# Patient Record
Sex: Female | Born: 1940
Health system: Southern US, Community
[De-identification: ages and names within clinical notes are randomized; demographics above are authoritative.]

## PROBLEM LIST (undated history)

## (undated) DIAGNOSIS — M1990 Unspecified osteoarthritis, unspecified site: Secondary | ICD-10-CM

## (undated) DIAGNOSIS — K5790 Diverticulosis of intestine, part unspecified, without perforation or abscess without bleeding: Secondary | ICD-10-CM

## (undated) DIAGNOSIS — E113299 Type 2 diabetes mellitus with mild nonproliferative diabetic retinopathy without macular edema, unspecified eye: Secondary | ICD-10-CM

## (undated) DIAGNOSIS — I712 Thoracic aortic aneurysm, without rupture, unspecified: Secondary | ICD-10-CM

## (undated) DIAGNOSIS — Z86010 Personal history of colonic polyps: Secondary | ICD-10-CM

## (undated) DIAGNOSIS — I7121 Aneurysm of the ascending aorta, without rupture: Secondary | ICD-10-CM

## (undated) DIAGNOSIS — M654 Radial styloid tenosynovitis [de Quervain]: Secondary | ICD-10-CM

## (undated) DIAGNOSIS — Z973 Presence of spectacles and contact lenses: Secondary | ICD-10-CM

## (undated) DIAGNOSIS — I10 Essential (primary) hypertension: Secondary | ICD-10-CM

## (undated) DIAGNOSIS — M26609 Unspecified temporomandibular joint disorder, unspecified side: Secondary | ICD-10-CM

## (undated) DIAGNOSIS — R011 Cardiac murmur, unspecified: Secondary | ICD-10-CM

## (undated) DIAGNOSIS — Z860101 Personal history of adenomatous and serrated colon polyps: Secondary | ICD-10-CM

## (undated) DIAGNOSIS — Z8669 Personal history of other diseases of the nervous system and sense organs: Secondary | ICD-10-CM

## (undated) DIAGNOSIS — E119 Type 2 diabetes mellitus without complications: Secondary | ICD-10-CM

## (undated) DIAGNOSIS — M5030 Other cervical disc degeneration, unspecified cervical region: Secondary | ICD-10-CM

## (undated) DIAGNOSIS — M47812 Spondylosis without myelopathy or radiculopathy, cervical region: Secondary | ICD-10-CM

## (undated) HISTORY — DX: Unspecified osteoarthritis, unspecified site: M19.90

## (undated) HISTORY — DX: Thoracic aortic aneurysm, without rupture: I71.2

## (undated) HISTORY — PX: KNEE ARTHROSCOPY: SUR90

## (undated) HISTORY — DX: Aneurysm of the ascending aorta, without rupture: I71.21

## (undated) HISTORY — PX: JOINT REPLACEMENT: SHX530

## (undated) HISTORY — DX: Essential (primary) hypertension: I10

---

## 1993-08-20 HISTORY — PX: ABDOMINAL HYSTERECTOMY: SHX81

## 2002-01-05 ENCOUNTER — Emergency Department (HOSPITAL_COMMUNITY): Admission: EM | Admit: 2002-01-05 | Discharge: 2002-01-05 | Payer: Self-pay | Admitting: Emergency Medicine

## 2002-01-05 ENCOUNTER — Encounter: Payer: Self-pay | Admitting: Emergency Medicine

## 2003-02-11 ENCOUNTER — Emergency Department (HOSPITAL_COMMUNITY): Admission: EM | Admit: 2003-02-11 | Discharge: 2003-02-12 | Payer: Self-pay | Admitting: Emergency Medicine

## 2003-03-22 ENCOUNTER — Encounter: Payer: Self-pay | Admitting: Internal Medicine

## 2003-03-22 ENCOUNTER — Encounter: Admission: RE | Admit: 2003-03-22 | Discharge: 2003-03-22 | Payer: Self-pay | Admitting: Internal Medicine

## 2004-04-07 ENCOUNTER — Encounter: Admission: RE | Admit: 2004-04-07 | Discharge: 2004-04-07 | Payer: Self-pay | Admitting: Cardiothoracic Surgery

## 2005-01-09 ENCOUNTER — Other Ambulatory Visit: Admission: RE | Admit: 2005-01-09 | Discharge: 2005-01-09 | Payer: Self-pay | Admitting: Obstetrics and Gynecology

## 2005-01-10 ENCOUNTER — Encounter: Admission: RE | Admit: 2005-01-10 | Discharge: 2005-01-10 | Payer: Self-pay | Admitting: Gastroenterology

## 2005-03-22 ENCOUNTER — Encounter (INDEPENDENT_AMBULATORY_CARE_PROVIDER_SITE_OTHER): Payer: Self-pay | Admitting: Specialist

## 2005-03-22 ENCOUNTER — Inpatient Hospital Stay (HOSPITAL_COMMUNITY): Admission: RE | Admit: 2005-03-22 | Discharge: 2005-03-26 | Payer: Self-pay | Admitting: General Surgery

## 2005-03-22 HISTORY — PX: OTHER SURGICAL HISTORY: SHX169

## 2005-05-04 ENCOUNTER — Encounter: Admission: RE | Admit: 2005-05-04 | Discharge: 2005-05-04 | Payer: Self-pay | Admitting: Cardiothoracic Surgery

## 2006-05-01 ENCOUNTER — Encounter: Admission: RE | Admit: 2006-05-01 | Discharge: 2006-05-01 | Payer: Self-pay | Admitting: Cardiothoracic Surgery

## 2006-09-04 ENCOUNTER — Encounter: Admission: RE | Admit: 2006-09-04 | Discharge: 2006-09-19 | Payer: Self-pay | Admitting: Orthopedic Surgery

## 2007-09-05 ENCOUNTER — Encounter: Admission: RE | Admit: 2007-09-05 | Discharge: 2007-09-05 | Payer: Self-pay | Admitting: Internal Medicine

## 2007-09-15 ENCOUNTER — Other Ambulatory Visit: Admission: RE | Admit: 2007-09-15 | Discharge: 2007-09-15 | Payer: Self-pay | Admitting: Obstetrics and Gynecology

## 2007-09-16 ENCOUNTER — Other Ambulatory Visit: Admission: RE | Admit: 2007-09-16 | Discharge: 2007-09-16 | Payer: Self-pay | Admitting: Diagnostic Radiology

## 2007-09-16 ENCOUNTER — Encounter (INDEPENDENT_AMBULATORY_CARE_PROVIDER_SITE_OTHER): Payer: Self-pay | Admitting: Diagnostic Radiology

## 2007-09-16 ENCOUNTER — Encounter: Admission: RE | Admit: 2007-09-16 | Discharge: 2007-09-16 | Payer: Self-pay | Admitting: Internal Medicine

## 2007-10-29 ENCOUNTER — Encounter: Admission: RE | Admit: 2007-10-29 | Discharge: 2007-10-29 | Payer: Self-pay | Admitting: Cardiothoracic Surgery

## 2007-10-31 ENCOUNTER — Ambulatory Visit: Payer: Self-pay | Admitting: Cardiothoracic Surgery

## 2008-01-07 ENCOUNTER — Encounter: Admission: RE | Admit: 2008-01-07 | Discharge: 2008-01-07 | Payer: Self-pay | Admitting: Gastroenterology

## 2008-08-31 HISTORY — PX: ROTATOR CUFF REPAIR: SHX139

## 2009-05-11 ENCOUNTER — Encounter: Admission: RE | Admit: 2009-05-11 | Discharge: 2009-05-11 | Payer: Self-pay | Admitting: Cardiothoracic Surgery

## 2009-05-13 ENCOUNTER — Ambulatory Visit: Payer: Self-pay | Admitting: Cardiothoracic Surgery

## 2009-07-01 ENCOUNTER — Inpatient Hospital Stay (HOSPITAL_COMMUNITY): Admission: RE | Admit: 2009-07-01 | Discharge: 2009-07-04 | Payer: Self-pay | Admitting: Specialist

## 2009-07-01 HISTORY — PX: TOTAL KNEE ARTHROPLASTY: SHX125

## 2010-09-10 ENCOUNTER — Encounter: Payer: Self-pay | Admitting: Cardiothoracic Surgery

## 2010-09-27 ENCOUNTER — Other Ambulatory Visit (INDEPENDENT_AMBULATORY_CARE_PROVIDER_SITE_OTHER): Payer: Self-pay | Admitting: Internal Medicine

## 2010-09-27 DIAGNOSIS — R112 Nausea with vomiting, unspecified: Secondary | ICD-10-CM

## 2010-09-27 DIAGNOSIS — IMO0001 Reserved for inherently not codable concepts without codable children: Secondary | ICD-10-CM

## 2010-10-03 ENCOUNTER — Other Ambulatory Visit (HOSPITAL_COMMUNITY): Payer: Self-pay

## 2010-11-22 LAB — DIFFERENTIAL
Basophils Absolute: 0 10*3/uL (ref 0.0–0.1)
Basophils Relative: 0 % (ref 0–1)
Eosinophils Absolute: 0.1 10*3/uL (ref 0.0–0.7)
Monocytes Relative: 7 % (ref 3–12)
Neutro Abs: 3.4 10*3/uL (ref 1.7–7.7)
Neutrophils Relative %: 66 % (ref 43–77)

## 2010-11-22 LAB — CROSSMATCH

## 2010-11-22 LAB — ABO/RH: ABO/RH(D): A POS

## 2010-11-22 LAB — BASIC METABOLIC PANEL
BUN: 7 mg/dL (ref 6–23)
Chloride: 97 mEq/L (ref 96–112)
Chloride: 99 mEq/L (ref 96–112)
Creatinine, Ser: 0.81 mg/dL (ref 0.4–1.2)
GFR calc non Af Amer: >60 mL/min (ref >60)
Glucose, Bld: 173 mg/dL — ABNORMAL HIGH (ref 70–99)
Glucose, Bld: 208 mg/dL — ABNORMAL HIGH (ref 70–99)
Potassium: 3.8 mEq/L (ref 3.5–5.1)
Potassium: 4 mEq/L (ref 3.5–5.1)
Sodium: 135 mEq/L (ref 135–145)

## 2010-11-22 LAB — PROTIME-INR
INR: 1.1 (ref 0.00–1.49)
Prothrombin Time: 14.1 seconds (ref 11.6–15.2)

## 2010-11-22 LAB — CBC
HCT: 26.9 % — ABNORMAL LOW (ref 36.0–46.0)
HCT: 27.5 % — ABNORMAL LOW (ref 36.0–46.0)
HCT: 33.9 % — ABNORMAL LOW (ref 36.0–46.0)
Hemoglobin: 11.2 g/dL — ABNORMAL LOW (ref 12.0–15.0)
Hemoglobin: 8.9 g/dL — ABNORMAL LOW (ref 12.0–15.0)
Hemoglobin: 9.3 g/dL — ABNORMAL LOW (ref 12.0–15.0)
MCHC: 33.2 g/dL (ref 30.0–36.0)
MCHC: 33.4 g/dL (ref 30.0–36.0)
MCV: 82.9 fL (ref 78.0–100.0)
Platelets: 177 10*3/uL (ref 150–400)
RBC: 3.3 MIL/uL — ABNORMAL LOW (ref 3.87–5.11)
RBC: 4.05 MIL/uL (ref 3.87–5.11)
RDW: 14.4 % (ref 11.5–15.5)
RDW: 14.6 % (ref 11.5–15.5)
RDW: 15.3 % (ref 11.5–15.5)

## 2010-11-22 LAB — COMPREHENSIVE METABOLIC PANEL
Alkaline Phosphatase: 91 U/L (ref 39–117)
BUN: 12 mg/dL (ref 6–23)
CO2: 29 mEq/L (ref 19–32)
Chloride: 98 mEq/L (ref 96–112)
Creatinine, Ser: 0.82 mg/dL (ref 0.4–1.2)
GFR calc non Af Amer: >60 mL/min (ref >60)
Glucose, Bld: 117 mg/dL — ABNORMAL HIGH (ref 70–99)
Potassium: 3.7 mEq/L (ref 3.5–5.1)
Total Bilirubin: 0.5 mg/dL (ref 0.3–1.2)

## 2010-11-22 LAB — URINALYSIS, ROUTINE W REFLEX MICROSCOPIC
Glucose, UA: NEGATIVE mg/dL
Ketones, ur: NEGATIVE mg/dL
Nitrite: NEGATIVE
Specific Gravity, Urine: 1.021 (ref 1.005–1.030)
pH: 7 (ref 5.0–8.0)

## 2010-11-22 LAB — APTT

## 2011-01-02 NOTE — Assessment & Plan Note (Signed)
OFFICE VISIT   [name], Savannah Lewis  DOB:  28-Jul-1941                                        May 13, 2009  CHART #:  [id number]   CURRENT PROBLEMS:  1. 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  2. Hypertension.  3. Degenerative arthritis with pending total right knee replacement by      Dr. Valma Cava.   CURRENT MEDICATIONS:  Benicar 25 mg daily, amlodipine 10 mg daily,  aspirin 81 mg daily, potassium 10 mEq daily.   PRESENT ILLNESS:  The patient is a very nice 70 year old female who was  incidentally found to have a mild fusiform aneurysm of the ascending  aorta in 2004.  This has been followed with serial scans, most recently  MRI-MRA of the thoracic aorta to minimize cumulative radiation exposure.  The aorta has remained stable and asymptomatic.  Her last scan was in  March 2009.  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  A knee replacement is scheduled by Dr. Thomasena Edis in November.   PHYSICAL EXAMINATION:  Vital Signs:  Today, her blood pressure is  130/78, pulse is 110 and her oxygen saturation is 96%.  Her weight is  143 pounds.  General:  She is alert and pleasant.  Lungs:  Breath sounds  are clear and equal.  Cardiac:  Rhythm is regular but increased.  There  is no S3 gallop or murmur.  Extremities:  Peripheral pulses are intact  in all extremities.  Neurologic:  Nonfocal.   Her MRI-MR angiogram of the thoracic aorta shows a stable mild  dilatation of the ascending aorta, 4.0 mm in diameter.  There is no  evidence of penetrating ulcer or intimal flap.  The distal arch measures  2.6 cm and the abdominal aorta is nonaneurysmal.  The proximal carotid  arteries are tortuous but nonobstructive.  Her lung fields on MRI appear  to  be clear.   IMPRESSION AND PLAN:  Mild aneurysmal fusiform dilatation of the  ascending aorta, stable on serial scanning.  I will plan on rescanning  the patient in 18-24 months.   Kerin Perna, M.D.  Electronically Signed   PV/MEDQ  D:  05/13/2009  T:  05/14/2009  Job:  [id number]

## 2011-01-02 NOTE — Assessment & Plan Note (Signed)
OFFICE VISIT   Savannah Lewis, Savannah Lewis  DOB:  April 27, 1941                                        October 31, 2007  CHART #:  [id number]   CURRENT PROBLEMS:  1. Moderate fusiform aneurysm of the ascending aorta, 4 cm, followed      since 2004.  2. Hypertension.  3. Degenerative arthritis.   HISTORY OF PRESENT ILLNESS:  Ms. Savannah Lewis is a 70 year old female who was  found on x-rays to have an incidental ascending aortic shadow on chest x-  ray after a fall.  A subsequent CT angiogram showed a 4-cm diameter  ascending aorta at the level of the mid ascending aorta.  The arch and  descending thoracic aorta were of normal caliber.  the patient has had  no symptoms related to this finding and continues to be carefully  followed for hypertension by Dr. Valentina Lucks.  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   Her last CT angiogram was performed in September 2007.  She now returns  18 months later.  The MRA of her thoracic aorta shows the ascending  aorta to measure 3.9 cm, stable without change.  There was no mural  thrombus, penetrating ulcer or evidence of dissection.   CURRENT MEDICATIONS:  1. Benicar 25 mg daily.  2. Norvasc 10 mg daily.  3. Potassium 10 mEq daily.  4. Aspirin 81 mg daily.   PHYSICAL EXAMINATION:  VITAL SIGNS:  Blood pressure 138/80, pulse 90,  respirations 18, saturation 98%, weight 146 pounds.  GENERAL:  She looks fantastic.  LUNGS:  Her breath sounds are clear.  CARDIAC:  Peripheral pulses are all intact.  Cardiac rhythm is without  murmur or gallop.  NEUROLOGIC:  Exam is intact.  EXTREMITIES:  There is no peripheral edema.   PLAN:  The patient continues to have a mild-to-moderate ascending aortic  fusiform aneurysm.  I have recommended another MRA in 18 months to 2  years.   Kerin Perna, M.D.  Electronically Signed   PV/MEDQ  D:  10/31/2007  T:  11/01/2007  Job:  [id number]   cc:   Thora Lance, M.D.

## 2011-01-05 NOTE — Op Note (Signed)
NAME:  Savannah Lewis, Savannah Lewis                ACCOUNT NO.:  [id number]   MEDICAL RECORD NO.:  [id number]          PATIENT TYPE:  INP   LOCATION:  0003                         FACILITY:  [facility]   PHYSICIAN:  Angelia Mould. Derrell Lolling, M.D.DATE OF BIRTH:  July 18, 1941   DATE OF PROCEDURE:  03/22/2005  DATE OF DISCHARGE:                                 OPERATIVE REPORT   PREOPERATIVE DIAGNOSIS:  Polyp of the cecum.   POSTOPERATIVE DIAGNOSIS:  Polyp of the cecum.   OPERATION PERFORMED:  Laparoscopic-assisted right colectomy.   SURGEON:  Angelia Mould. Derrell Lolling, M.D.   FIRST ASSISTANT:  Cyndia Bent, M.D.   OPERATIVE INDICATIONS:  This is a 70 year old black female who had a single  bout of painless hematochezia earlier this year.  She had a colonoscopy  which showed an erythematous patch at 25 cm but no active bleeding.  She had  of colonic formation preventing Dr. Laural Benes from the getting the scope more  proximally.  She had a virtual colonoscopy which showed a high probability  of a 12 mm polyp in the cecum.  This was read as being 2 cm proximal to the  ileocecal valve.  She was advised to undergo resection of this area to rule  out neoplasia. She has undergone a bowel prep at home and is brought to  hospital electively.   OPERATIVE FINDINGS:  In the cecal bulb below the ileocecal valve, there was  about a 1.5 cm sessile polyp that was multilobulated but felt soft and did  not have any gross findings of carcinoma.  O. Otherwise the exam of the  abdomen and the opened colon was unremarkable.  The appendix was surgically  absent. The liver, gallbladder, stomach, duodenum, small intestine and the  rest of the large intestine looked normal.  The peritoneal surfaces looked  normal.   OPERATIVE TECHNIQUE:  Following the induction of general endotracheal  anesthesia, the patient was identified.  A Foley catheter was inserted.  Intravenous antibiotics were given.  The abdomen was  prepped and draped in  sterile fashion.  0.5% Marcaine with epinephrine local infiltration  anesthetic. A transverse incision was made at the upper rim of the  umbilicus.  The fascia was incised transversely, and the abdominal cavity  entered under direct vision. A 10 mm Hassan trocar was inserted and secured  the pursestring suture with 0 Vicryl. Pneumoperitoneum was created.  The  camera was inserted with visualization and findings as described above.  A 5  mm trocars was placed in this left suprapubic area and the 11 mm trocar was  placed in the left upper quadrant.   The patient was positioned in a Trendelenburg position and rotated to the  left.  We could identify the terminal ileum, the ileocecal valve, the cecum  and right colon.  We mobilized terminal ileum and right colon and hepatic  flexure by dividing the lateral peritoneal attachments using the harmonic  scalpel.  We were able to fully mobilize the colon over  until we could  place the cecum all the way over the body and fundus of the stomach.  We  identified the duodenum and preserved that.  Once we felt we had good  mobility we  released the pneumoperitoneum.  We removed the [facility] trocar at  the umbilicus and extended the transverse incision for a distance of about 8  cm, divided the rectus muscle with cautery and entered the abdominal cavity.  We placed a protractor in place and delivered the terminal ileum and right  colon through the wound.  We were not really sure we could palpate anything.  We transected the terminal ileum about 2 inches proximal ileocecal valve  with a GIA stapling device. We transected the transverse colon to the right  of the middle colic vessels with a GIA stapling device.  Mesenteric vessels  were isolated, clamped, divided and ligated 2-0 silk ties. The ileocolic  vessels were large and they were doubly ligated with 2-0 silk ties.   We took the specimen to a side table and opened it up completely, opening up  the  terminal ileum, ileocecal valve and the antimesenteric wall of the colon  and we found a small polyp in the cecal bulb just below the ileocecal valve  consistent with the CT findings. We sent this for routine examination.   Anastomosis was created to the terminal ileum and the right transverse colon  with a GIA stapling device and the defect left of the bowel wall was closed  with a TA-60 stapling device.  We changed our instruments and gloves at this  point.  A few extra sutures of 3-0 silk were placed to reinforce the staple  line at strategic points.  The mesentery was closed with interrupted figure-  of-eight sutures of 2-0 silk.  We checked for bleeding.  There was none. We  irrigated the specimen.  Everything looked fine and we dropped the intestine  back into the abdominal cavity.  The linea alba was closed with four or five  interrupted sutures of 0 Novofil.  The posterior rectus sheath was closed  with running suture of #1 PDS.  The wound was irrigated with saline and the  anterior rectus sheath closed with a running suture of #1 PDS.  Skin was  closed with skin staples.   We then we created the pneumoperitoneum once again and inserted the video  camera and using bowel graspers, we looked around irrigated a little bit in  the pelvis, right gutter and above the liver and got all of the fluid out.  The bowel anastomosis looked fine.  There was no bleeding, and we felt  everything was going to be fine. The pneumoperitoneum was released.  The  trocars were removed.  The two remaining trocar sites were closed with skin  staples.  Clean bandages were placed, and the patient taken to the recovery  room in d with skin staples. Clean bandages were placed. The patient taken  to recovery room in stable condition.  Estimated blood loss was about 50-70  cc. Complications none.  Sponge, needle and instrument counts were correct.       HMI/MEDQ  D:  03/22/2005  T:  03/22/2005  Job:   [id number]   cc:   [place] Daphine Deutscher, MD  [address]  [place]  [place] [address]   Thora Lance, M.D.  [address]  [place]  [place] [address]  Fax: [phone]

## 2011-01-05 NOTE — Discharge Summary (Signed)
NAME:  Savannah Lewis, Savannah Lewis                ACCOUNT NO.:  [id number]   MEDICAL RECORD NO.:  [id number]          PATIENT TYPE:  INP   LOCATION:  1621                         [facility]ACILITY[facility]   PHYSICIAN:  Angelia Mould. Derrell Lolling, M.D.DATE OF BIRTH:  02/25/1941   DATE OF ADMISSION:  03/22/2005  DATE OF DISCHARGE:  03/26/2005                                 DISCHARGE SUMMARY   FINAL DIAGNOSES:  1.  Villous adenoma of the cecum.  2.  Hypertension.  3.  Thoracic aortic aneurysm  4.  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  5.  Status post ovarian cystectomy and appendectomy.   PROCEDURE:  Laparoscopic-assisted right colectomy on March 22, 2005.   HISTORY OF PRESENT ILLNESS:  This is a 70 year old, African-American female  who had a single episode of painless hematochezia in April of this year.  This was a single episode and has not recurred.  A colonoscopy was performed  on December 15, 2004, and she had a colonic loop formation and Dr. Laural Benes  could not get the scope past 60 cm.  A 25 cm he saw and intensely red  diverticulum and theorized that this might have been the site of her recent  hematochezia.  She then had a virtual colonoscopy on Jan 10, 2005, showing a  12 mm polyp in the cecum fell to be a high probability finding.  She then  had an abdominal CT scan which showed no abnormalities, although she is  known to have a small ascending thoracic aortic aneurysm for which she is  asymptomatic.  Dr. Laural Benes asked me to consider resection of this area of  the colon since he could not get to it by colonoscopy.  I saw the patient in  the office and advised her to have this done because of the potential risks  that this could be malignant or premalignant and she elected to have that  done.   PHYSICAL EXAMINATION:  GENERAL:  Slightly overweight, black female in no  distress.  Height 5 feet 2 inches, weight 156, blood pressure 180/79, pulse  57.  ABDOMEN:  Significant physical  findings limited to the abdomen which is  borderline obese, soft, nontender.  Liver and spleen not enlarged or  hernias.  Well-healed Pfannenstiel incision.  No hernias noted.   HOSPITAL COURSE:  Following a 2-day bowel prep at home, the patient was  admitted electively.  She was taken to operating room on March 22, 2005, and  underwent a laparoscopic-assisted right colectomy.  There were no other  abnormal findings.  We did find a small polyp in the cecum. Final pathology  report showed that this was a benign villous adenoma.  She had 10/10 lymph  nodes examined and were negative for any type of a pathologic process.   Postoperatively, the patient did well.  She had her Foley catheter out on  the first postop day and we resumed her oral Benicar and hydrochlorothiazide  for her hypertension and her blood pressure remained stable and  normotensive.  Once the pathology report was completed, she was advised  about  this.  She was discharged on March 26, 2005, and at that time she was  tolerating a full liquid diet and was having no nausea, vomiting or  bloating.  She had several loose stools.  Her abdomen was soft and benign  with reasonable healing of the wounds.  She was given a prescription for  Vicodin for pain and to resume her home medications.   DISCHARGE MEDICATIONS:  1.  Benicar 25 mg daily.  2.  Atenolol 50 mg daily.  3.  Potassium 10 mEq daily.  4.  Aleve p.r.n.   FOLLOW UP:  She was advised to follow up with me in the office within 1 week  for staple removal.   DIET:  Diet was discussed.   ACTIVITY:  Activities were discussed.      Angelia Mould. Derrell Lolling, M.D.  Electronically Signed     HMI/MEDQ  D:  04/08/2005  T:  04/09/2005  Job:  [id number]   cc:   Danise Edge, M.D.  [address]  [place]  [place] [address]  Fax: [phone]   Thora Lance, M.D.  [address]  [place]  [place] [address]  Fax: [phone]

## 2011-02-08 ENCOUNTER — Other Ambulatory Visit: Payer: Self-pay | Admitting: Cardiothoracic Surgery

## 2011-02-08 DIAGNOSIS — I712 Thoracic aortic aneurysm, without rupture, unspecified: Secondary | ICD-10-CM

## 2011-02-19 ENCOUNTER — Ambulatory Visit: Payer: Self-pay | Admitting: Cardiothoracic Surgery

## 2011-02-19 ENCOUNTER — Other Ambulatory Visit: Payer: Self-pay

## 2011-02-23 ENCOUNTER — Ambulatory Visit (INDEPENDENT_AMBULATORY_CARE_PROVIDER_SITE_OTHER): Payer: Medicare Other | Admitting: Cardiothoracic Surgery

## 2011-02-23 ENCOUNTER — Ambulatory Visit
Admission: RE | Admit: 2011-02-23 | Discharge: 2011-02-23 | Disposition: A | Discharge status: Home / Self Care | Payer: Medicare Other | Source: Ambulatory Visit | Attending: Cardiothoracic Surgery | Admitting: Cardiothoracic Surgery

## 2011-02-23 DIAGNOSIS — I712 Thoracic aortic aneurysm, without rupture, unspecified: Secondary | ICD-10-CM

## 2011-02-23 MED ORDER — GADOBENATE DIMEGLUMINE 529 MG/ML IV SOLN: 13.0000 mL | Freq: Once | INTRAVENOUS | Status: AC | PRN

## 2011-03-06 NOTE — Assessment & Plan Note (Signed)
OFFICE VISIT  Savannah Lewis, Savannah Lewis DOB:  Jan 07, 1941                                        March 05, 2011 CHART #:  [id number]  CURRENT PROBLEMS: 1. A 4-cm fusiform ascending thoracic aneurysm, stable since 2004. 2. Hypertension.  HISTORY OF PRESENT ILLNESS:  The patient is a very nice 70 year old female with hypertension who was noted to have a mild fusiform aneurysm in the ascending aorta in 2004.  This has been followed with serial scans, most recently an MRI/MRA in 2010 of the thoracic aorta.  The patient has remained asymptomatic during this period and there has been no significant increase in size of the aorta.  The aortic valve has been previously echoed and there is no evidence of significant valve disease or pathology.  The patient has been quite active especially since a knee replacement in the last fall and is on a regular exercise program and has been losing weight intentionally.  She denies any chest pain or upper back pain or palpitation.  She returns now for followup exam and an MRA of the thoracic aorta.  PHYSICAL EXAMINATION:  Blood pressure is 115/83, pulse 90, respirations 18, saturation 99%.  She is alert and pleasant and very comfortable. Breath sounds are clear and equal.  Cardiac rhythm is regular without gallop or murmur.  Extremities have good pulses and carotid pulsations are normal.  Neurologic exam is nonfocal.  CURRENT MEDICATIONS: 1. Aspirin 81 mg daily. 2. Metformin 500 mg daily, started approximately 6 months ago. 3. Amlodipine 10 mg a day. 4. Losartan/HCTZ 100/25 mg daily. 5. Multivitamin.  LABORATORY DATA:  The MRA of her thoracic aorta shows the fusiform dilatation to be unchanged at 3.9-4.0 cm.  There is no evidence of penetrating ulcer, hematoma, or intimal flap.  The arch and descending thoracic aorta are normal in diameter.  IMPRESSION AND PLAN:  Continue her current medical regimen and I plan on seeing her back in  2 years with an MRA of the thoracic aorta.  Kerin Perna, M.D. Electronically Signed  PV/MEDQ  D:  03/05/2011  T:  03/06/2011  Job:  [id number]  cc:   Thora Lance, M.D.

## 2012-02-11 ENCOUNTER — Other Ambulatory Visit: Payer: Self-pay | Admitting: Cardiothoracic Surgery

## 2012-02-11 DIAGNOSIS — I712 Thoracic aortic aneurysm, without rupture, unspecified: Secondary | ICD-10-CM

## 2012-02-18 ENCOUNTER — Other Ambulatory Visit: Payer: Self-pay | Admitting: Cardiothoracic Surgery

## 2012-03-04 ENCOUNTER — Other Ambulatory Visit: Payer: Medicare Other

## 2012-03-05 ENCOUNTER — Ambulatory Visit: Payer: Medicare Other | Admitting: Cardiothoracic Surgery

## 2012-03-31 ENCOUNTER — Ambulatory Visit (INDEPENDENT_AMBULATORY_CARE_PROVIDER_SITE_OTHER): Payer: Medicare Other | Admitting: Family Medicine

## 2012-03-31 VITALS — BP 142/78 | HR 100 | Temp 98.6°F | Resp 16 | Ht 62.0 in | Wt 140.0 lb

## 2012-03-31 DIAGNOSIS — M549 Dorsalgia, unspecified: Secondary | ICD-10-CM

## 2012-03-31 MED ORDER — MELOXICAM 7.5 MG PO TABS: 7.5000 mg | ORAL_TABLET | Freq: Every day | ORAL | Status: AC

## 2012-03-31 NOTE — Progress Notes (Signed)
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 Her pain was initiated by rolling over in bed 2 nights ago where she woke up and had sharp pains in her back. She's having no bowel or bladder symptoms. She has no leg weakness or numbness.  Patient denies fever or previous low back problems. The pain is worse with movement.  Objective: Patient in no acute distress Straight-leg raising is negative Reflexes are symmetrical and normal in the ankle and knee areas There is no rash on her back There is no tenderness on her back She has full range of motion of knees ankles and feet.  Assessment: Acute back muscle strain  .   1. Back pain  meloxicam (MOBIC) 7.5 MG tablet

## 2012-09-06 ENCOUNTER — Ambulatory Visit (INDEPENDENT_AMBULATORY_CARE_PROVIDER_SITE_OTHER): Payer: 59 | Admitting: Family Medicine

## 2012-09-06 VITALS — BP 144/81 | HR 90 | Temp 98.0°F | Resp 16 | Ht 62.0 in | Wt 137.2 lb

## 2012-09-06 DIAGNOSIS — J322 Chronic ethmoidal sinusitis: Secondary | ICD-10-CM

## 2012-09-06 DIAGNOSIS — R059 Cough, unspecified: Secondary | ICD-10-CM

## 2012-09-06 DIAGNOSIS — G9331 Postviral fatigue syndrome: Secondary | ICD-10-CM

## 2012-09-06 DIAGNOSIS — R05 Cough: Secondary | ICD-10-CM

## 2012-09-06 DIAGNOSIS — G933 Postviral fatigue syndrome: Secondary | ICD-10-CM

## 2012-09-06 DIAGNOSIS — I10 Essential (primary) hypertension: Secondary | ICD-10-CM

## 2012-09-06 DIAGNOSIS — J329 Chronic sinusitis, unspecified: Secondary | ICD-10-CM

## 2012-09-06 DIAGNOSIS — E119 Type 2 diabetes mellitus without complications: Secondary | ICD-10-CM

## 2012-09-06 MED ORDER — HYDROCODONE-HOMATROPINE 5-1.5 MG/5ML PO SYRP: 5.0000 mL | ORAL_SOLUTION | ORAL | Status: DC | PRN

## 2012-09-06 MED ORDER — AMOXICILLIN 875 MG PO TABS: 875.0000 mg | ORAL_TABLET | Freq: Two times a day (BID) | ORAL | Status: DC

## 2012-09-06 MED ORDER — BENZONATATE 100 MG PO CAPS: ORAL_CAPSULE | ORAL | Status: DC

## 2012-09-06 NOTE — Patient Instructions (Addendum)
Drink plenty of fluids Get enough rest Use the antibiotic one twice daily The Hycodan cough syrup should be taken every 4-6 hours as needed for cough. It will make you drowsy in most cases, so I am also giving you some cough pills that are not very sedating and you can take them when you return to work. I suspect she will be up for working on Monday, but if you're he'll let me know if he needed an excuse.

## 2012-09-06 NOTE — Progress Notes (Signed)
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 She has been sick for one week. It started last Saturday. She has had a little ear discomfort. Her nose is been running. Her throat was sore but Carling has seemed to help that. She is coughing a lot and bringing up a lot of mucus. She tried over-the-counter medications without getting much relief. She saw her primary doctor on Monday who said this was a viral infection. However it just is not going away.  Patient has hypertension and diabetes. Her sugar was 113 this morning.  Objective: Pleasant alert lady in no major distress except she keeps coughing and sniffling. Her TMs are normal. Throat clear. Neck supple without significant nodes. Chest is clear to auscultation. Heart regular without murmurs. Abdomen soft nontender. No ankle edema.  Assessment: Sinusitis/bronchitis Posttrial syndrome who  Plan: Amoxicillin Hycodan Tessalon

## 2013-01-30 ENCOUNTER — Other Ambulatory Visit: Payer: Self-pay | Admitting: *Deleted

## 2013-01-30 DIAGNOSIS — I712 Thoracic aortic aneurysm, without rupture, unspecified: Secondary | ICD-10-CM

## 2013-03-11 ENCOUNTER — Ambulatory Visit
Admission: RE | Admit: 2013-03-11 | Discharge: 2013-03-11 | Disposition: A | Discharge status: Home / Self Care | Payer: Medicare Other | Source: Ambulatory Visit | Attending: Cardiothoracic Surgery | Admitting: Cardiothoracic Surgery

## 2013-03-11 ENCOUNTER — Encounter: Payer: Self-pay | Admitting: Cardiothoracic Surgery

## 2013-03-11 ENCOUNTER — Ambulatory Visit (INDEPENDENT_AMBULATORY_CARE_PROVIDER_SITE_OTHER): Payer: Medicare Other | Admitting: Cardiothoracic Surgery

## 2013-03-11 VITALS — BP 125/79 | HR 102 | Resp 20 | Ht 62.0 in | Wt 137.0 lb

## 2013-03-11 DIAGNOSIS — I712 Thoracic aortic aneurysm, without rupture, unspecified: Secondary | ICD-10-CM

## 2013-03-11 DIAGNOSIS — I10 Essential (primary) hypertension: Secondary | ICD-10-CM

## 2013-03-11 DIAGNOSIS — M129 Arthropathy, unspecified: Secondary | ICD-10-CM

## 2013-03-11 DIAGNOSIS — E119 Type 2 diabetes mellitus without complications: Secondary | ICD-10-CM

## 2013-03-11 DIAGNOSIS — M1990 Unspecified osteoarthritis, unspecified site: Secondary | ICD-10-CM | POA: Insufficient documentation

## 2013-03-11 DIAGNOSIS — I7121 Aneurysm of the ascending aorta, without rupture: Secondary | ICD-10-CM

## 2013-03-11 MED ORDER — GADOBENATE DIMEGLUMINE 529 MG/ML IV SOLN
13.0000 mL | Freq: Once | INTRAVENOUS | Status: AC | PRN
  Administered 2013-03-11: 13 mL via INTRAVENOUS

## 2013-03-11 NOTE — Progress Notes (Signed)
PCP is Lillia Mountain, MD Referring Provider is Lillia Mountain, MD  Chief Complaint  Patient presents with  . Follow-up    2 year f/u MRA Chest    HPI: Stable 4.1 cm fusiform ascending thoracic aneurysm, stable since 2004. History of hypertension well-controlled. Nonsmoker. No symptoms of chest pain or shortness of breath. Patient still works full time for [employer].  MRA done today shows mild dilatation of the ascending thoracic aorta 4.1 cm. No evidence of penetrating ulcer or hematoma.   Past Medical History  Diagnosis Date  . Arthritis   . Hypertension   . Thoracic ascending aortic aneurysm   . Diabetes   . Bell's palsy     right  . TMJ (dislocation of temporomandibular joint)   . DJD (degenerative joint disease)     knees  . Fractured toe     Past Surgical History  Procedure Laterality Date  . Tah-uso, uterine fibroid    . Bilateral knee arthroscopies      History reviewed. No pertinent family history.  Social History History  Substance Use Topics  . Smoking status: Former Smoker    Types: Cigarettes    Quit date: 08/20/1978  . Smokeless tobacco: Not on file  . Alcohol Use: No    Current Outpatient Prescriptions  Medication Sig Dispense Refill  . amLODipine (NORVASC) 10 MG tablet Take 10 mg by mouth daily.      Marland Kitchen amoxicillin (AMOXIL) 875 MG tablet Take 1 tablet (875 mg total) by mouth 2 (two) times daily.  20 tablet  0  . aspirin 81 MG tablet Take 81 mg by mouth daily.      . benzonatate (TESSALON) 100 MG capsule Use 1-2 tablets 3 times daily as necessary for cough. May be used with other cough medicines if needed.  30 capsule  0  . HYDROcodone-homatropine (HYCODAN) 5-1.5 MG/5ML syrup Take 5 mLs by mouth every 4 (four) hours as needed for cough.  120 mL  0  . losartan (COZAAR) 25 MG tablet Take 25 mg by mouth daily.      . meloxicam (MOBIC) 7.5 MG tablet Take 1 tablet (7.5 mg total) by mouth daily.  10 tablet  0  . metFORMIN (GLUCOPHAGE) 1000  MG tablet Take 1,000 mg by mouth 2 (two) times daily with a meal.      . potassium chloride (K-DUR) 10 MEQ tablet Take 10 mEq by mouth 2 (two) times daily.       No current facility-administered medications for this visit.    No Known Allergies  Review of Systems no significant complaints  BP 125/79  Pulse 102  Resp 20  Ht 5\' 2"  (1.575 m)  Wt 137 lb (62.143 kg)  BMI 25.05 kg/m2  SpO2 97% Physical Exam Alert and comfortable Lungs clear Neck with good carotid pulsations, no JVD or mass Cardiac rhythm regular without murmur or gallop Abdomen soft without pulsatile mass Extremities warm well perfused positive pulses Neuro without focal motor deficit  Diagnostic Tests:  MRA of the thoracic aorta shows stable minimal dilatation of the ascending aorta. Maximum diameter 4.1 cm. Impression: His condition has been stable now for over 10 years. Further serial scanning will be discontinued and she'll continue her current risk factor program which is been successful for 10 years-regular exercise, blood pressure control, weight loss, nonsmoking.  Plan: Return as needed

## 2013-03-11 NOTE — Patient Instructions (Signed)
Your mild enlargement of the aorta above the heart has been stable now for over 10 years The best plan now is to continue good blood pressure control, daily exercise and to keep her weight under control. Serial scans of your chest will probably be more harmful in good this point due to expense and possible side effects.

## 2013-03-16 ENCOUNTER — Other Ambulatory Visit: Payer: Self-pay | Admitting: Gastroenterology

## 2013-04-14 ENCOUNTER — Encounter (HOSPITAL_COMMUNITY)
Admission: RE | Disposition: A | Discharge status: Home / Self Care | Payer: Self-pay | Source: Ambulatory Visit | Attending: Gastroenterology

## 2013-04-14 ENCOUNTER — Encounter (HOSPITAL_COMMUNITY): Payer: Self-pay | Admitting: *Deleted

## 2013-04-14 ENCOUNTER — Ambulatory Visit (HOSPITAL_COMMUNITY)
Admission: RE | Admit: 2013-04-14 | Discharge: 2013-04-14 | Disposition: A | Discharge status: Home / Self Care | Payer: 59 | Source: Ambulatory Visit | Attending: Gastroenterology | Admitting: Gastroenterology

## 2013-04-14 DIAGNOSIS — Z860100 Personal history of colon polyps, unspecified: Secondary | ICD-10-CM | POA: Insufficient documentation

## 2013-04-14 DIAGNOSIS — E119 Type 2 diabetes mellitus without complications: Secondary | ICD-10-CM | POA: Insufficient documentation

## 2013-04-14 DIAGNOSIS — Z09 Encounter for follow-up examination after completed treatment for conditions other than malignant neoplasm: Secondary | ICD-10-CM | POA: Insufficient documentation

## 2013-04-14 DIAGNOSIS — Z90710 Acquired absence of both cervix and uterus: Secondary | ICD-10-CM | POA: Insufficient documentation

## 2013-04-14 DIAGNOSIS — I10 Essential (primary) hypertension: Secondary | ICD-10-CM | POA: Insufficient documentation

## 2013-04-14 DIAGNOSIS — Z9049 Acquired absence of other specified parts of digestive tract: Secondary | ICD-10-CM | POA: Insufficient documentation

## 2013-04-14 DIAGNOSIS — Z96659 Presence of unspecified artificial knee joint: Secondary | ICD-10-CM | POA: Insufficient documentation

## 2013-04-14 DIAGNOSIS — M899 Disorder of bone, unspecified: Secondary | ICD-10-CM | POA: Insufficient documentation

## 2013-04-14 DIAGNOSIS — K5730 Diverticulosis of large intestine without perforation or abscess without bleeding: Secondary | ICD-10-CM | POA: Insufficient documentation

## 2013-04-14 DIAGNOSIS — Z980 Intestinal bypass and anastomosis status: Secondary | ICD-10-CM | POA: Insufficient documentation

## 2013-04-14 HISTORY — PX: COLONOSCOPY: SHX5424

## 2013-04-14 SURGERY — COLONOSCOPY
Anesthesia: Moderate Sedation

## 2013-04-14 MED ORDER — MIDAZOLAM HCL 10 MG/2ML IJ SOLN
INTRAMUSCULAR | Status: AC
  Filled 2013-04-14: qty 2

## 2013-04-14 MED ORDER — MIDAZOLAM HCL 5 MG/5ML IJ SOLN
INTRAMUSCULAR | Status: DC | PRN
  Administered 2013-04-14 (×2): 2.5 mg via INTRAVENOUS
  Administered 2013-04-14: 2 mg via INTRAVENOUS

## 2013-04-14 MED ORDER — FENTANYL CITRATE 0.05 MG/ML IJ SOLN
INTRAMUSCULAR | Status: DC | PRN
  Administered 2013-04-14: 25 ug via INTRAVENOUS
  Administered 2013-04-14: 50 ug via INTRAVENOUS

## 2013-04-14 MED ORDER — SODIUM CHLORIDE 0.9 % IV SOLN
INTRAVENOUS | Status: DC
  Administered 2013-04-14: 500 mL via INTRAVENOUS

## 2013-04-14 MED ORDER — FENTANYL CITRATE 0.05 MG/ML IJ SOLN
INTRAMUSCULAR | Status: AC
  Filled 2013-04-14: qty 2

## 2013-04-14 NOTE — Op Note (Signed)
Problem: History of adenomatous colon polyp. Right segmental colectomy to remove a tubulovillous adenomatous polyp from the cecum in 2006.  Endoscopist: Danise Edge  Premedication: Fentanyl 75 mcg intravenously. Versed 7 mg intravenously  Procedure: Surveillance colonoscopy The patient was placed in the left lateral decubitus position. Anal inspection and digital rectal exam were normal. The Pentax pediatric colonoscope was introduced into the rectum and advanced to the ileo-right colonic surgical anastomosis. Colonic preparation for the exam today was good.  Rectum. Normal. Retroflex view of the distal rectum normal.  Sigmoid colon and descending colon. Extensive colonic diverticulosis  Splenic flexure. Normal.  Transverse colon. Normal.  Splenic flexure. Normal.  Ascending colon. Normal.  Ileo-right colonic surgical anastomosis. Normal.  Assessment: Normal surveillance proctocolonoscopy to the ileo-right colon surgical anastomosis  Recommendation: Schedule repeat surveillance colonoscopy in 5 years.

## 2013-04-14 NOTE — H&P (Signed)
  Problem: History of adenomatous colon polyps  History: The patient is a 73 year old female born [birth year]. In 2006, a tubulovillous adenoma in the cecum was removed surgically. On March 01, 2008, she underwent a surveillance colonoscopy with removal of a 3 mm serrated adenomatous descending colon polyp.  The patient is scheduled to undergo a surveillance colonoscopy today.  Past medical history: Hypertension. Type 2 diabetes mellitus. Osteopenia. Degenerative joint disease of the knees. Colonic diverticulosis. Total abdominal hysterectomy. Bilateral knee arthroscopy. Laparoscopic right segmental colectomy. Right shoulder surgery. Right knee replacement surgery. Left thyroid cyst biopsy.  Medication allergies:  Exam: The patient is alert and lying comfortably on the endoscopy stretcher. Lungs are clear to auscultation. Cardiac exam reveals a regular rhythm. Abdomen is soft and nontender to palpation.  Plan: Proceed with surveillance colonoscopy.

## 2013-04-15 ENCOUNTER — Encounter (HOSPITAL_COMMUNITY): Payer: Self-pay | Admitting: Gastroenterology

## 2013-09-18 ENCOUNTER — Encounter (HOSPITAL_BASED_OUTPATIENT_CLINIC_OR_DEPARTMENT_OTHER): Payer: Self-pay | Admitting: *Deleted

## 2013-09-23 ENCOUNTER — Encounter (HOSPITAL_BASED_OUTPATIENT_CLINIC_OR_DEPARTMENT_OTHER): Payer: Self-pay | Admitting: *Deleted

## 2013-09-23 NOTE — Progress Notes (Signed)
NPO AFTER MN WITH EXCEPTION CLEAR LIQUIDS UNTIL 0900 (NO CREAM/ MILK PRODUCTS).  ARRIVE AT 1300. NEEDS ISTAT AND EKG. WILL TAKE NORVASC, AND LOSARTAN AM DOS W/ SIPS OF WATER AND IF NEEDED TAKE TYLENOL.

## 2013-09-24 ENCOUNTER — Ambulatory Visit (HOSPITAL_BASED_OUTPATIENT_CLINIC_OR_DEPARTMENT_OTHER): Admit: 2013-09-24 | Payer: Medicare Other | Admitting: Orthopedic Surgery

## 2013-09-24 ENCOUNTER — Encounter (HOSPITAL_BASED_OUTPATIENT_CLINIC_OR_DEPARTMENT_OTHER): Payer: Self-pay

## 2013-09-24 ENCOUNTER — Ambulatory Visit (HOSPITAL_BASED_OUTPATIENT_CLINIC_OR_DEPARTMENT_OTHER)
Admission: RE | Admit: 2013-09-24 | Discharge: 2013-09-24 | Disposition: A | Discharge status: Home / Self Care | Payer: 59 | Source: Ambulatory Visit | Attending: Orthopedic Surgery | Admitting: Orthopedic Surgery

## 2013-09-24 ENCOUNTER — Encounter (HOSPITAL_BASED_OUTPATIENT_CLINIC_OR_DEPARTMENT_OTHER)
Admission: RE | Disposition: A | Discharge status: Home / Self Care | Payer: Self-pay | Source: Ambulatory Visit | Attending: Orthopedic Surgery

## 2013-09-24 ENCOUNTER — Encounter (HOSPITAL_BASED_OUTPATIENT_CLINIC_OR_DEPARTMENT_OTHER): Payer: 59 | Admitting: Anesthesiology

## 2013-09-24 ENCOUNTER — Ambulatory Visit (HOSPITAL_BASED_OUTPATIENT_CLINIC_OR_DEPARTMENT_OTHER): Payer: 59 | Admitting: Anesthesiology

## 2013-09-24 DIAGNOSIS — Z79899 Other long term (current) drug therapy: Secondary | ICD-10-CM | POA: Diagnosis not present

## 2013-09-24 DIAGNOSIS — Z90710 Acquired absence of both cervix and uterus: Secondary | ICD-10-CM | POA: Diagnosis not present

## 2013-09-24 DIAGNOSIS — I712 Thoracic aortic aneurysm, without rupture, unspecified: Secondary | ICD-10-CM | POA: Insufficient documentation

## 2013-09-24 DIAGNOSIS — Z96659 Presence of unspecified artificial knee joint: Secondary | ICD-10-CM | POA: Insufficient documentation

## 2013-09-24 DIAGNOSIS — E1139 Type 2 diabetes mellitus with other diabetic ophthalmic complication: Secondary | ICD-10-CM | POA: Diagnosis not present

## 2013-09-24 DIAGNOSIS — Z860100 Personal history of colon polyps, unspecified: Secondary | ICD-10-CM | POA: Insufficient documentation

## 2013-09-24 DIAGNOSIS — E11319 Type 2 diabetes mellitus with unspecified diabetic retinopathy without macular edema: Secondary | ICD-10-CM | POA: Diagnosis not present

## 2013-09-24 DIAGNOSIS — Z7982 Long term (current) use of aspirin: Secondary | ICD-10-CM | POA: Diagnosis not present

## 2013-09-24 DIAGNOSIS — M654 Radial styloid tenosynovitis [de Quervain]: Secondary | ICD-10-CM | POA: Insufficient documentation

## 2013-09-24 DIAGNOSIS — I10 Essential (primary) hypertension: Secondary | ICD-10-CM | POA: Insufficient documentation

## 2013-09-24 HISTORY — DX: Personal history of colonic polyps: Z86.010

## 2013-09-24 HISTORY — DX: Unspecified temporomandibular joint disorder, unspecified side: M26.609

## 2013-09-24 HISTORY — DX: Personal history of other diseases of the nervous system and sense organs: Z86.69

## 2013-09-24 HISTORY — DX: Presence of spectacles and contact lenses: Z97.3

## 2013-09-24 HISTORY — DX: Type 2 diabetes mellitus without complications: E11.9

## 2013-09-24 HISTORY — DX: Diverticulosis of intestine, part unspecified, without perforation or abscess without bleeding: K57.90

## 2013-09-24 HISTORY — DX: Other cervical disc degeneration, unspecified cervical region: M50.30

## 2013-09-24 HISTORY — DX: Radial styloid tenosynovitis (de quervain): M65.4

## 2013-09-24 HISTORY — PX: DORSAL COMPARTMENT RELEASE: SHX5039

## 2013-09-24 HISTORY — DX: Personal history of adenomatous and serrated colon polyps: Z86.0101

## 2013-09-24 HISTORY — DX: Type 2 diabetes mellitus with mild nonproliferative diabetic retinopathy without macular edema, unspecified eye: E11.3299

## 2013-09-24 HISTORY — DX: Spondylosis without myelopathy or radiculopathy, cervical region: M47.812

## 2013-09-24 LAB — GLUCOSE, CAPILLARY: GLUCOSE-CAPILLARY: 100 mg/dL — AB (ref 70–99)

## 2013-09-24 LAB — POCT I-STAT 4, (NA,K, GLUC, HGB,HCT)
Glucose, Bld: 108 mg/dL — ABNORMAL HIGH (ref 70–99)
HCT: 37 % (ref 36.0–46.0)
HEMOGLOBIN: 12.6 g/dL (ref 12.0–15.0)
Potassium: 3.1 mEq/L — ABNORMAL LOW (ref 3.7–5.3)
Sodium: 141 mEq/L (ref 137–147)

## 2013-09-24 SURGERY — RELEASE, FIRST DORSAL COMPARTMENT, HAND
Anesthesia: LOCAL | Site: Wrist | Laterality: Left

## 2013-09-24 SURGERY — RELEASE, FIRST DORSAL COMPARTMENT, HAND
Anesthesia: Monitor Anesthesia Care | Site: Wrist | Laterality: Left

## 2013-09-24 MED ORDER — HYDROCODONE-ACETAMINOPHEN 5-300 MG PO TABS: 1.0000 | ORAL_TABLET | Freq: Four times a day (QID) | ORAL | Status: DC | PRN

## 2013-09-24 MED ORDER — DOCUSATE SODIUM 100 MG PO CAPS: 100.0000 mg | ORAL_CAPSULE | Freq: Two times a day (BID) | ORAL | Status: DC

## 2013-09-24 MED ORDER — CHLORHEXIDINE GLUCONATE 4 % EX LIQD
60.0000 mL | Freq: Once | CUTANEOUS | Status: DC
  Filled 2013-09-24: qty 60

## 2013-09-24 MED ORDER — LIDOCAINE HCL (PF) 1 % IJ SOLN
INTRAMUSCULAR | Status: DC | PRN
  Administered 2013-09-24: 7.5 mL

## 2013-09-24 MED ORDER — FENTANYL CITRATE 0.05 MG/ML IJ SOLN
INTRAMUSCULAR | Status: AC
  Filled 2013-09-24: qty 4

## 2013-09-24 MED ORDER — FENTANYL CITRATE 0.05 MG/ML IJ SOLN
INTRAMUSCULAR | Status: DC | PRN
Start: 2013-09-24 — End: 2013-09-24
  Administered 2013-09-24: 50 ug via INTRAVENOUS

## 2013-09-24 MED ORDER — CEFAZOLIN SODIUM-DEXTROSE 2-3 GM-% IV SOLR
2.0000 g | INTRAVENOUS | Status: AC
  Administered 2013-09-24: 2 g via INTRAVENOUS
  Filled 2013-09-24: qty 50

## 2013-09-24 MED ORDER — LACTATED RINGERS IV SOLN
INTRAVENOUS | Status: DC
  Administered 2013-09-24: 14:00:00 via INTRAVENOUS
  Filled 2013-09-24: qty 1000

## 2013-09-24 MED ORDER — 0.9 % SODIUM CHLORIDE (POUR BTL) OPTIME
TOPICAL | Status: DC | PRN
  Administered 2013-09-24: 1000 mL

## 2013-09-24 MED ORDER — MIDAZOLAM HCL 2 MG/2ML IJ SOLN
INTRAMUSCULAR | Status: AC
  Filled 2013-09-24: qty 2

## 2013-09-24 MED ORDER — PROPOFOL 10 MG/ML IV EMUL
INTRAVENOUS | Status: DC | PRN
  Administered 2013-09-24: 50 ug/kg/min via INTRAVENOUS

## 2013-09-24 MED ORDER — LIDOCAINE HCL (CARDIAC) 20 MG/ML IV SOLN
INTRAVENOUS | Status: DC | PRN
  Administered 2013-09-24: 50 mg via INTRAVENOUS

## 2013-09-24 MED ORDER — BUPIVACAINE HCL 0.25 % IJ SOLN
INTRAMUSCULAR | Status: DC | PRN
  Administered 2013-09-24: 7.5 mL

## 2013-09-24 MED ORDER — FENTANYL CITRATE 0.05 MG/ML IJ SOLN
25.0000 ug | INTRAMUSCULAR | Status: DC | PRN
  Filled 2013-09-24: qty 1

## 2013-09-24 MED ORDER — MIDAZOLAM HCL 5 MG/5ML IJ SOLN
INTRAMUSCULAR | Status: DC | PRN
Start: 2013-09-24 — End: 2013-09-24
  Administered 2013-09-24: 1 mg via INTRAVENOUS

## 2013-09-24 MED ORDER — LACTATED RINGERS IV SOLN
INTRAVENOUS | Status: DC
  Filled 2013-09-24: qty 1000

## 2013-09-24 MED ORDER — ONDANSETRON HCL 4 MG/2ML IJ SOLN
INTRAMUSCULAR | Status: DC | PRN
  Administered 2013-09-24: 4 mg via INTRAVENOUS

## 2013-09-24 SURGICAL SUPPLY — 58 items
BANDAGE CONFORM 2  STR LF (GAUZE/BANDAGES/DRESSINGS) ×2 IMPLANT
BANDAGE ELASTIC 3 VELCRO ST LF (GAUZE/BANDAGES/DRESSINGS) ×2 IMPLANT
BANDAGE ELASTIC 4 VELCRO ST LF (GAUZE/BANDAGES/DRESSINGS) ×2 IMPLANT
BENZOIN TINCTURE PRP APPL 2/3 (GAUZE/BANDAGES/DRESSINGS) ×2 IMPLANT
BLADE SURG 15 STRL LF DISP TIS (BLADE) ×1 IMPLANT
BLADE SURG 15 STRL SS (BLADE) ×1
BNDG CMPR 9X4 STRL LF SNTH (GAUZE/BANDAGES/DRESSINGS) ×1
BNDG ELASTIC 2 VLCR STRL LF (GAUZE/BANDAGES/DRESSINGS) ×2 IMPLANT
BNDG ESMARK 4X9 LF (GAUZE/BANDAGES/DRESSINGS) ×2 IMPLANT
BNDG GAUZE ELAST 4 BULKY (GAUZE/BANDAGES/DRESSINGS) ×2 IMPLANT
CORDS BIPOLAR (ELECTRODE) IMPLANT
COVER MAYO STAND STRL (DRAPES) IMPLANT
COVER TABLE BACK 60X90 (DRAPES) ×2 IMPLANT
CUFF TOURNIQUET SINGLE 18IN (TOURNIQUET CUFF) ×2 IMPLANT
DRAIN PENROSE 18X1/4 LTX STRL (WOUND CARE) IMPLANT
DRAPE EXTREMITY T 121X128X90 (DRAPE) ×2 IMPLANT
DRAPE LG THREE QUARTER DISP (DRAPES) ×2 IMPLANT
DRAPE SURG 17X23 STRL (DRAPES) ×2 IMPLANT
DRSG EMULSION OIL 3X3 NADH (GAUZE/BANDAGES/DRESSINGS) ×2 IMPLANT
GAUZE SPONGE 4X4 16PLY XRAY LF (GAUZE/BANDAGES/DRESSINGS) IMPLANT
GLOVE BIO SURGEON STRL SZ8 (GLOVE) ×2 IMPLANT
GLOVE BIOGEL M 6.5 STRL (GLOVE) ×2 IMPLANT
GLOVE BIOGEL PI IND STRL 6.5 (GLOVE) ×1 IMPLANT
GLOVE BIOGEL PI IND STRL 7.0 (GLOVE) ×1 IMPLANT
GLOVE BIOGEL PI IND STRL 8.5 (GLOVE) ×1 IMPLANT
GLOVE BIOGEL PI INDICATOR 6.5 (GLOVE) ×1
GLOVE BIOGEL PI INDICATOR 7.0 (GLOVE) ×1
GLOVE BIOGEL PI INDICATOR 8.5 (GLOVE) ×1
GOWN STRL REUS W/ TWL LRG LVL3 (GOWN DISPOSABLE) ×1 IMPLANT
GOWN STRL REUS W/ TWL XL LVL3 (GOWN DISPOSABLE) ×1 IMPLANT
GOWN STRL REUS W/TWL LRG LVL3 (GOWN DISPOSABLE) ×3 IMPLANT
GOWN STRL REUS W/TWL XL LVL3 (GOWN DISPOSABLE) ×4 IMPLANT
LOOP VESSEL MAXI BLUE (MISCELLANEOUS) IMPLANT
NDL SAFETY ECLIPSE 18X1.5 (NEEDLE) ×2 IMPLANT
NEEDLE HYPO 18GX1.5 SHARP (NEEDLE) ×2
NEEDLE HYPO 25X1 1.5 SAFETY (NEEDLE) ×2 IMPLANT
NS IRRIG 500ML POUR BTL (IV SOLUTION) ×2 IMPLANT
PACK BASIN DAY SURGERY FS (CUSTOM PROCEDURE TRAY) ×2 IMPLANT
PAD ALCOHOL SWAB (MISCELLANEOUS) ×8 IMPLANT
PAD CAST 3X4 CTTN HI CHSV (CAST SUPPLIES) ×1 IMPLANT
PAD CAST 4YDX4 CTTN HI CHSV (CAST SUPPLIES) ×1 IMPLANT
PADDING CAST ABS 3INX4YD NS (CAST SUPPLIES) ×1
PADDING CAST ABS 4INX4YD NS (CAST SUPPLIES) ×1
PADDING CAST ABS COTTON 3X4 (CAST SUPPLIES) ×1 IMPLANT
PADDING CAST ABS COTTON 4X4 ST (CAST SUPPLIES) ×1 IMPLANT
PADDING CAST COTTON 3X4 STRL (CAST SUPPLIES) ×2
PADDING CAST COTTON 4X4 STRL (CAST SUPPLIES) ×2
SPLINT FIBERGLASS 3X35 (CAST SUPPLIES) IMPLANT
SPLINT FIBERGLASS 4X30 (CAST SUPPLIES) IMPLANT
SPONGE GAUZE 4X4 12PLY STER LF (GAUZE/BANDAGES/DRESSINGS) ×2 IMPLANT
STOCKINETTE 4X48 STRL (DRAPES) ×2 IMPLANT
STRIP CLOSURE SKIN 1/4X3 (GAUZE/BANDAGES/DRESSINGS) ×2 IMPLANT
STRIP CLOSURE SKIN 1/4X4 (GAUZE/BANDAGES/DRESSINGS) ×2 IMPLANT
SUT PROLENE 4 0 PS 2 18 (SUTURE) ×2 IMPLANT
SYR 20CC LL (SYRINGE) ×2 IMPLANT
SYR BULB 3OZ (MISCELLANEOUS) ×2 IMPLANT
TOWEL OR 17X24 6PK STRL BLUE (TOWEL DISPOSABLE) ×2 IMPLANT
UNDERPAD 30X30 INCONTINENT (UNDERPADS AND DIAPERS) ×2 IMPLANT

## 2013-09-24 NOTE — Anesthesia Preprocedure Evaluation (Signed)
Anesthesia Evaluation  Patient identified by MRN, date of birth, ID band Patient awake    Reviewed: Allergy & Precautions, H&P , NPO status , Patient's Chart, lab work & pertinent test results  Airway Mallampati: II TM Distance: >3 FB Neck ROM: full    Dental no notable dental hx.    Pulmonary neg pulmonary ROS, former smoker,  breath sounds clear to auscultation  Pulmonary exam normal       Cardiovascular Exercise Tolerance: Good hypertension, Pt. 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   Neuro/Psych negative neurological ROS  negative psych ROS   GI/Hepatic negative GI ROS, Neg liver ROS,   Endo/Other  diabetes, Well Controlled, Type 2, Oral Hypoglycemic Agents  Renal/GU negative Renal ROS  negative genitourinary   Musculoskeletal   Abdominal   Peds  Hematology negative hematology ROS (+)   Anesthesia Other Findings   Reproductive/Obstetrics negative OB ROS                           Anesthesia Physical Anesthesia Plan  ASA: III  Anesthesia Plan: MAC   Post-op Pain Management:    Induction:   Airway Management Planned: Simple Face Mask  Additional Equipment:   Intra-op Plan:   Post-operative Plan:   Informed Consent: I have reviewed the patients History and Physical, chart, labs and discussed the procedure including the risks, benefits and alternatives for the proposed anesthesia with the patient or authorized representative who has indicated his/her understanding and acceptance.   Dental Advisory Given  Plan Discussed with: CRNA and Surgeon  Anesthesia Plan Comments:         Anesthesia Quick Evaluation

## 2013-09-24 NOTE — Anesthesia Postprocedure Evaluation (Signed)
  Anesthesia Post-op Note  Patient: Savannah Lewis  Procedure(s) Performed: Procedure(s) (LRB): LEFT WRIST FIRST DORSAL COMPARTMENT TENOSYNOVITIS (Left)  Patient Location: PACU  Anesthesia Type: MAC  Level of Consciousness: awake and alert   Airway and Oxygen Therapy: Patient Spontanous Breathing  Post-op Pain: mild  Post-op Assessment: Post-op Vital signs reviewed, Patient's Cardiovascular Status Stable, Respiratory Function Stable, Patent Airway and No signs of Nausea or vomiting  Last Vitals:  Filed Vitals:   09/24/13 1503  BP: 134/74  Pulse: 82  Temp: 36.3 C  Resp: 16    Post-op Vital Signs: stable   Complications: No apparent anesthesia complications

## 2013-09-24 NOTE — Transfer of Care (Addendum)
Immediate Anesthesia Transfer of Care Note  Patient: Savannah Lewis  Procedure(s) Performed: Procedure(s) (LRB): LEFT WRIST FIRST DORSAL COMPARTMENT TENOSYNOVITIS (Left)  Patient Location: PACU  Anesthesia Type:MAC  Level of Consciousness: awake, alert  and oriented  Airway & Oxygen Therapy: Patient Spontanous Breathing and Patient connected to face mask oxygen  Post-op Assessment: Report given to PACU RN and Post -op Vital signs reviewed and stable  Post vital signs: Reviewed and stable  Complications: No apparent anesthesia complications

## 2013-09-24 NOTE — Discharge Instructions (Signed)
KEEP BANDAGE CLEAN AND DRY CALL OFFICE FOR F/U APPT [phone] IN 12 DAYS KEEP HAND ELEVATED ABOVE HEART OK TO APPLY ICE TO OPERATIVE AREA CONTACT OFFICE IF ANY WORSENING PAIN OR CONCERNS.   Post Anesthesia Home Care Instructions  Activity: Get plenty of rest for the remainder of the day. A responsible adult should stay with you for 24 hours following the procedure.  For the next 24 hours, DO NOT: -Drive a car -Paediatric nurse -Drink alcoholic beverages -Take any medication unless instructed by your physician -Make any legal decisions or sign important papers.  Meals: Start with liquid foods such as gelatin or soup. Progress to regular foods as tolerated. Avoid greasy, spicy, heavy foods. If nausea and/or vomiting occur, drink only clear liquids until the nausea and/or vomiting subsides. Call your physician if vomiting continues.  Special Instructions/Symptoms: Your throat may feel dry or sore from the anesthesia or the breathing tube placed in your throat during surgery. If this causes discomfort, gargle with warm salt water. The discomfort should disappear within 24 hours.        HAND SURGERY    HOME CARE INSTRUCTIONS    The following instructions have been prepared to help you care for yourself upon your return home today.  Wound Care:  Keep your hand elevated above the level of your heart. Do not allow it to dangle by your side. Keep the dressing dry and do not remove it unless your doctor advises you to do so. He will usually change it at the time of you post-op visit. Moving your fingers is advised to stimulate circulation but will depend on the site of your surgery. Of course, if you have a splint applied your doctor will advise you about movement.  Activity:  Do not drive or operate machinery today. Rest today and then you may return to your normal activity and work as indicated by your physician.  Diet: Drink liquids today or eat a light diet. You may resume a regular  diet tomorrow.  General expectations: Pain for two or three days. Fingers may become slightly swollen.   Unexpected Observations- Call your doctor if any of these occur: Severe pain not relieved by pain medication. Elevated temperature. Dressing soaked with blood. Inability to move fingers. White or bluish color to fingers.  Return to office: ***

## 2013-09-24 NOTE — H&P (Signed)
Savannah Lewis is an 73 y.o. female.   Chief Complaint: left wrist pain HPI: pt with longstanding dorsal left wrist pain Pt here for surgery to help with dorsal left wrist pain No prior surgery on left wrist Pt has had bracing and injections and elects surgery  Past Medical History  Diagnosis Date  . Hypertension   . Type 2 diabetes mellitus   . History of Bell's palsy     2003  RIGHT SIDE-  resolved  . Thoracic ascending aortic aneurysm     4.1 CM  STABLE SINCE 2004 PER DR VANTRIGHT NOTE 02/2013  . Diverticulosis   . 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 De Quervain's tenosynovitis, left     WRIST  . Diabetic retinopathy, nonproliferative   . DJD (degenerative joint disease)     left knee  . DDD (degenerative disc disease), cervical   . Cervical spondylosis   . TMJ (temporomandibular joint syndrome)   . Wears glasses     Past Surgical History  Procedure Laterality Date  . Colonoscopy N/A 04/14/2013    Procedure: COLONOSCOPY;  Surgeon: Garlan Fair, MD;  Location: [facility];  Service: Endoscopy;  Laterality: N/A;  . Knee arthroscopy Bilateral RIGHT 2002 & 1994/   LEFT  1995  . 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 Rotator cuff repair Right 08-31-2008  . Laparoscopy assisted  right colectomy  03-22-2005    CECUM POLYP  . Abdominal hysterectomy  1995    W/ UNILATERAL SALPINGOOPHORECTOMY    History reviewed. No pertinent family history. Social History:  reports that she quit smoking about 54 years ago. Her smoking use included Cigarettes. She smoked 0.00 packs per day for 2 years. She has never used smokeless tobacco. She reports that she does not drink alcohol or use illicit drugs.  Allergies: No Known Allergies  Medications Prior to Admission  Medication Sig Dispense Refill  . acetaminophen (TYLENOL) 500 MG tablet Take 500 mg by mouth every 6 (six) hours as needed.      Marland Kitchen amLODipine (NORVASC)  10 MG tablet Take 10 mg by mouth every morning.       Marland Kitchen aspirin 81 MG tablet Take 81 mg by mouth daily.      Marland Kitchen losartan (COZAAR) 25 MG tablet Take 25 mg by mouth every morning.       . metFORMIN (GLUCOPHAGE) 1000 MG tablet Take 1,000 mg by mouth 2 (two) times daily with a meal.      . naproxen sodium (ANAPROX) 220 MG tablet Take 220 mg by mouth as needed.      . potassium chloride (K-DUR) 10 MEQ tablet Take 10 mEq by mouth 2 (two) times daily.        Results for orders placed during the hospital encounter of 09/24/13 (from the past 48 hour(s))  POCT I-STAT 4, (NA,K, GLUC, HGB,HCT)     Status: Abnormal   Collection Time    09/24/13  1:33 PM      Result Value Range   Sodium 141  137 - 147 mEq/L   Potassium 3.1 (*) 3.7 - 5.3 mEq/L   Glucose, Bld 108 (*) 70 - 99 mg/dL   HCT 37.0  36.0 - 46.0 %   Hemoglobin 12.6  12.0 - 15.0 g/dL   No results found.  ROS NO RECENT ILLNESSES OR HOSPITALIZATIONS  Blood pressure 139/89, pulse 79, temperature 97.1 F (36.2 C),  temperature source Oral, resp. rate 16, height 5\' 3"  (1.6 m), weight 59.875 kg (132 lb), SpO2 97.00%. Physical Exam  General Appearance:  Alert, cooperative, no distress, appears stated age  Head:  Normocephalic, without obvious abnormality, atraumatic  Eyes:  Pupils equal, conjunctiva/corneas clear,         Throat: Lips, mucosa, and tongue normal; teeth and gums normal  Neck: No visible masses     Lungs:   respirations unlabored  Chest Wall:  No tenderness or deformity  Heart:  Regular rate and rhythm,  Abdomen:   Soft, non-tender,         Extremities: LEFT WRIST: TTP OVER FIRST DORSAL COMPARTMENT FINGERS WARM WELL PERFUSED GOOD THUMB MOBILITY +FINKELSTEINS GOOD RADIAL PULSE  Pulses: 2+ and symmetric  Skin: Skin color, texture, turgor normal, no rashes or lesions     Neurologic: Normal    Assessment/pLAN  LEFT WRIST FIRST DORSAL COMPARTMENT TENOSYNOVITIS  LEFT WRIST FIRST DORSAL COMPARTMENT  TENOSYNOVECTOMY  R/B/A DISCUSSED WITH PT IN OFFICE.  PT VOICED UNDERSTANDING OF PLAN CONSENT SIGNED DAY OF SURGERY PT SEEN AND EXAMINED PRIOR TO OPERATIVE PROCEDURE/DAY OF SURGERY SITE MARKED. QUESTIONS ANSWERED WILL GO HOME FOLLOWING SURGERY Linna Hoff 09/24/2013, 2:21 PM

## 2013-09-24 NOTE — Progress Notes (Signed)
Dr. Landry Dyke noted of Potassium level of 3.1, no new orders.

## 2013-09-24 NOTE — Brief Op Note (Signed)
09/24/2013  2:23 PM  PATIENT:  Savannah Lewis  73 y.o. female  PRE-OPERATIVE DIAGNOSIS:  LEFT WRIST DE'QUERVAIN TENOSYNOVITIS  POST-OPERATIVE DIAGNOSIS: same  PROCEDURE:  Procedure(s) with comments: LEFT WRIST FIRST DORSAL COMPARTMENT TENOSYNOVITIS (Left) - ANESTHESIA: LOCAL/IV SEDATION  SURGEON:  Surgeon(s) and Role:    * Linna Hoff, MD - Primary  PHYSICIAN ASSISTANT:   ASSISTANTS: none   ANESTHESIA:   MAC  EBL:     BLOOD ADMINISTERED:none  DRAINS: none   LOCAL MEDICATIONS USED:  MARCAINE     SPECIMEN:  No Specimen  DISPOSITION OF SPECIMEN:  N/A  COUNTS:  YES  TOURNIQUET:    DICTATION: .[id number]  PLAN OF CARE: Discharge to home after PACU  PATIENT DISPOSITION:  PACU - hemodynamically stable.   Delay start of Pharmacological VTE agent (>24hrs) due to surgical blood loss or risk of bleeding: not applicable

## 2013-09-25 ENCOUNTER — Encounter (HOSPITAL_BASED_OUTPATIENT_CLINIC_OR_DEPARTMENT_OTHER): Payer: Self-pay | Admitting: Orthopedic Surgery

## 2013-09-25 NOTE — Op Note (Signed)
NAME:  Savannah Lewis, Savannah Lewis                ACCOUNT NO.:  [id number]  MEDICAL RECORD NO.:  [id number]  LOCATION:                               FACILITY:  [facility]  PHYSICIAN:  Melrose Nakayama, MD  DATE OF BIRTH:  January 24, 1941  DATE OF PROCEDURE:  09/24/2013 DATE OF DISCHARGE:  09/24/2013                              OPERATIVE REPORT   PREOPERATIVE DIAGNOSIS:  Left wrist first dorsal compartment tenosynovitis.  POSTOPERATIVE DIAGNOSIS:  Left wrist first dorsal compartment tenosynovitis.  ATTENDING PHYSICIAN:  Melrose Nakayama, MD who scrubbed and present for the entire procedure.  ASSISTANT SURGEON:  None.  ANESTHESIA:  Xylocaine 1% and 0.25% Marcaine local block with IV sedation.  SURGICAL PROCEDURE:  Left wrist first dorsal compartment tenosynovectomy.  SURGICAL INDICATION:  Ms. Savannah Lewis is a right-hand-dominant female with persistent radial-sided wrist pain.  The patient has failed nonsurgical treatment.  The patient would like to undergo the above procedure. Risks, benefits, and alternatives were discussed in detail with the patient and signed informed consent was obtained.  Risks include, but not limited to bleeding, infection, damage to nearby nerves, arteries, or tendons, loss of motion to the wrist and digits, incomplete relief of symptoms, and need for further surgical intervention.  DESCRIPTION OF PROCEDURE:  The patient was properly identified in the preop holding area and mark with a permanent marker made on the left wrist to indicate correct operative site.  The patient was then brought back to the operating room, placed supine on the anesthesia room table where the IV sedation was administered.  The patient tolerated this well.  A well-padded tourniquet was then placed on the left brachium and sealed with a 1000 drape.  Local anesthetic was administered.  The left upper extremity was then prepped and draped in normal sterile fashion. Time-out was called, the correct site  was identified, and procedure then begun.  Attention then turned to the left wrist where the longitudinal incision was made directly over the first dorsal compartment.  The dissection then carried down through the skin and subcutaneous tissue. Careful attention was made to protect the distal terminal branch of the lateral antebrachial cutaneous nerve as well as radial sensory nerve. Dissection was then carried down to the first dorsal compartment where the tendon sheath was then incised longitudinally exposing the first dorsal compartment tendons.  The patient had a large amount of inflammatory changes along both the APL.  Several slips of the APL as well as several compartment the EPD.  The compartment of the EPD was then released both proximally and distally.  Aggressive tenosynovectomy was then carried out of the first dorsal compartment tendons.  The wound was then thoroughly irrigated.  There did not appear to be tendon subluxation after release of the first dorsal compartment.  Copious wound irrigation was done throughout.  Subcutaneous tissues were then closed with Monocryl and skin closed with a running 4-0 Prolene subcuticular.  Benzoin and Steri-Strips were applied.  Tourniquet was deflated with good perfusion of the fingertips.  The patient tolerated the procedure well, was placed in a small sterile soft thumb spica dressing.  Taken to the recovery room in good condition.  POSTPROCEDURAL PLAN:  The patient discharged home, seen back in the office in approximately 2 weeks for wound check, suture removal, and transition or short-arm brace, gradual use and activity.     Melrose Nakayama, MD     FWO/MEDQ  D:  09/24/2013  T:  09/25/2013  Job:  [id number]

## 2014-02-11 ENCOUNTER — Emergency Department (HOSPITAL_COMMUNITY)
Admission: EM | Admit: 2014-02-11 | Discharge: 2014-02-12 | Disposition: A | Discharge status: Home / Self Care | Payer: 59 | Attending: Emergency Medicine | Admitting: Emergency Medicine

## 2014-02-11 ENCOUNTER — Emergency Department (HOSPITAL_COMMUNITY): Payer: 59

## 2014-02-11 ENCOUNTER — Encounter (HOSPITAL_COMMUNITY): Payer: Self-pay | Admitting: Emergency Medicine

## 2014-02-11 DIAGNOSIS — I10 Essential (primary) hypertension: Secondary | ICD-10-CM | POA: Insufficient documentation

## 2014-02-11 DIAGNOSIS — Z8739 Personal history of other diseases of the musculoskeletal system and connective tissue: Secondary | ICD-10-CM | POA: Insufficient documentation

## 2014-02-11 DIAGNOSIS — Z860100 Personal history of colon polyps, unspecified: Secondary | ICD-10-CM | POA: Insufficient documentation

## 2014-02-11 DIAGNOSIS — M1710 Unilateral primary osteoarthritis, unspecified knee: Secondary | ICD-10-CM | POA: Diagnosis not present

## 2014-02-11 DIAGNOSIS — Z8679 Personal history of other diseases of the circulatory system: Secondary | ICD-10-CM | POA: Diagnosis not present

## 2014-02-11 DIAGNOSIS — M25469 Effusion, unspecified knee: Secondary | ICD-10-CM | POA: Insufficient documentation

## 2014-02-11 DIAGNOSIS — M1712 Unilateral primary osteoarthritis, left knee: Secondary | ICD-10-CM

## 2014-02-11 DIAGNOSIS — Z79899 Other long term (current) drug therapy: Secondary | ICD-10-CM | POA: Diagnosis not present

## 2014-02-11 DIAGNOSIS — E1139 Type 2 diabetes mellitus with other diabetic ophthalmic complication: Secondary | ICD-10-CM | POA: Diagnosis not present

## 2014-02-11 DIAGNOSIS — Z87891 Personal history of nicotine dependence: Secondary | ICD-10-CM | POA: Insufficient documentation

## 2014-02-11 DIAGNOSIS — Z8719 Personal history of other diseases of the digestive system: Secondary | ICD-10-CM | POA: Diagnosis not present

## 2014-02-11 DIAGNOSIS — E11329 Type 2 diabetes mellitus with mild nonproliferative diabetic retinopathy without macular edema: Secondary | ICD-10-CM | POA: Diagnosis not present

## 2014-02-11 DIAGNOSIS — M2540 Effusion, unspecified joint: Secondary | ICD-10-CM

## 2014-02-11 DIAGNOSIS — M25569 Pain in unspecified knee: Secondary | ICD-10-CM | POA: Diagnosis present

## 2014-02-11 NOTE — ED Notes (Signed)
Patient states that her left knee has been hurting for the past month but has gotten worse ander T:ylenol and Aleve are not working

## 2014-02-12 MED ORDER — ACETAMINOPHEN 325 MG PO TABS: 325.0000 mg | ORAL_TABLET | Freq: Four times a day (QID) | ORAL | Status: DC | PRN

## 2014-02-12 NOTE — Discharge Instructions (Signed)
Please call your doctor for a followup appointment within 24-48 hours. When you talk to your doctor please let them know that you were seen in the emergency department and have them acquire all of your records so that they can discuss the findings with you and formulate a treatment plan to fully care for your new and ongoing problems. Please call and set up an appointment with your primary care provider to be seen and reassessed Please call and set up an appointment with orthopedics Please keep knee sleeve on at all times Please rest, ice, elevate-2 as above nose Please avoid any physical or strenuous activity Please continue to monitor symptoms closely and if symptoms are to worsen or change (fever greater than 101, chills, chest pain, shortness of breath, difficulty breathing, numbness, tingling, worsening or changes to pain pattern, fall, injury, swelling to the knee, redness, hot to the touch, loss sensation) please report back to the ED immediately  Arthritis, Nonspecific Arthritis is inflammation of a joint. This usually means pain, redness, warmth or swelling are present. One or more joints may be involved. There are a number of types of arthritis. Your caregiver may not be able to tell what type of arthritis you have right away. CAUSES  The most common cause of arthritis is the wear and tear on the joint (osteoarthritis). This causes damage to the cartilage, which can break down over time. The knees, hips, back and neck are most often affected by this type of arthritis. Other types of arthritis and common causes of joint pain include:  Sprains and other injuries near the joint. Sometimes minor sprains and injuries cause pain and swelling that develop hours later.  Rheumatoid arthritis. This affects hands, feet and knees. It usually affects both sides of your body at the same time. It is often associated with chronic ailments, fever, weight loss and general weakness.  Crystal arthritis. Gout  and pseudo gout can cause occasional acute severe pain, redness and swelling in the foot, ankle, or knee.  Infectious arthritis. Bacteria can get into a joint through a break in overlying skin. This can cause infection of the joint. Bacteria and viruses can also spread through the blood and affect your joints.  Drug, infectious and allergy reactions. Sometimes joints can become mildly painful and slightly swollen with these types of illnesses. SYMPTOMS   Pain is the main symptom.  Your joint or joints can also be red, swollen and warm or hot to the touch.  You may have a fever with certain types of arthritis, or even feel overall ill.  The joint with arthritis will hurt with movement. Stiffness is present with some types of arthritis. DIAGNOSIS  Your caregiver will suspect arthritis based on your description of your symptoms and on your exam. Testing may be needed to find the type of arthritis:  Blood and sometimes urine tests.  X-ray tests and sometimes CT or MRI scans.  Removal of fluid from the joint (arthrocentesis) is done to check for bacteria, crystals or other causes. Your caregiver (or a specialist) will numb the area over the joint with a local anesthetic, and use a needle to remove joint fluid for examination. This procedure is only minimally uncomfortable.  Even with these tests, your caregiver may not be able to tell what kind of arthritis you have. Consultation with a specialist (rheumatologist) may be helpful. TREATMENT  Your caregiver will discuss with you treatment specific to your type of arthritis. If the specific type cannot be determined,  then the following general recommendations may apply. Treatment of severe joint pain includes:  Rest.  Elevation.  Anti-inflammatory medication (for example, ibuprofen) may be prescribed. Avoiding activities that cause increased pain.  Only take over-the-counter or prescription medicines for pain and discomfort as recommended  by your caregiver.  Cold packs over an inflamed joint may be used for 10 to 15 minutes every hour. Hot packs sometimes feel better, but do not use overnight. Do not use hot packs if you are diabetic without your caregiver's permission.  A cortisone shot into arthritic joints may help reduce pain and swelling.  Any acute arthritis that gets worse over the next 1 to 2 days needs to be looked at to be sure there is no joint infection. Long-term arthritis treatment involves modifying activities and lifestyle to reduce joint stress jarring. This can include weight loss. Also, exercise is needed to nourish the joint cartilage and remove waste. This helps keep the muscles around the joint strong. HOME CARE INSTRUCTIONS   Do not take aspirin to relieve pain if gout is suspected. This elevates uric acid levels.  Only take over-the-counter or prescription medicines for pain, discomfort or fever as directed by your caregiver.  Rest the joint as much as possible.  If your joint is swollen, keep it elevated.  Use crutches if the painful joint is in your leg.  Drinking plenty of fluids may help for certain types of arthritis.  Follow your caregiver's dietary instructions.  Try low-impact exercise such as:  Swimming.  Water aerobics.  Biking.  Walking.  Morning stiffness is often relieved by a warm shower.  Put your joints through regular range-of-motion. SEEK MEDICAL CARE IF:   You do not feel better in 24 hours or are getting worse.  You have side effects to medications, or are not getting better with treatment. SEEK IMMEDIATE MEDICAL CARE IF:   You have a fever.  You develop severe joint pain, swelling or redness.  Many joints are involved and become painful and swollen.  There is severe back pain and/or leg weakness.  You have loss of bowel or bladder control. Document Released: 09/13/2004 Document Revised: 10/29/2011 Document Reviewed: 09/29/2008 [facility] Patient  Information 2015 [place], [place]. This information is not intended to replace advice given to you by your health care provider. Make sure you discuss any questions you have with your health care provider.

## 2014-02-12 NOTE — ED Provider Notes (Signed)
CSN: [id number]     Arrival date & time 02/11/14  2025 History   First MD Initiated Contact with Patient 02/12/14 0040     Chief Complaint  Patient presents with  . Knee Pain     (Consider location/radiation/quality/duration/timing/severity/associated sxs/prior Treatment) The history is provided by the patient. No language interpreter was used.  Savannah Lewis is a 73 year old female with past medical history of hypertension, diabetes type 2, pulse palsy, diverticulosis, DDD, DJD presenting to the ED with left knee pain does been ongoing for the past 2 months. Patient reported that the discomfort is localized to the lateral aspect of the left knee described as a sharp shooting pain without radiation. Stated that she's a long history of knee discomfort-reported that she had a right knee replacement performed in 2000 and. Reported that she was being seen and assessed by orthopedics, Dr. Theda Sers who has been giving the patient injection stash patient reported last injection was approximately 6 months ago. Stated that she's been taking Aleve with minimal relief. Reported that she works in [employer] and she's been working 12 hour shifts for the past couple of days-works approximately 4 days per week. Denied injury, fall, numbness, tingling, loss of sensation, chest pain, shortness of breath, difficulty breathing, weakness, headache, dizziness, fever, chills, change in colors. PCP Dr. [facility] Dr. Theda Sers   Past Medical History  Diagnosis Date  . Hypertension   . Type 2 diabetes mellitus   . History of Bell's palsy     2003  RIGHT SIDE-  resolved  . Thoracic ascending aortic aneurysm     4.1 CM  STABLE SINCE 2004 PER DR VANTRIGHT NOTE 02/2013  . Diverticulosis   . 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 De Quervain's tenosynovitis, left     WRIST  . Diabetic retinopathy, nonproliferative   . DJD (degenerative  joint disease)     left knee  . DDD (degenerative disc disease), cervical   . Cervical spondylosis   . TMJ (temporomandibular joint syndrome)   . Wears glasses    Past Surgical History  Procedure Laterality Date  . Colonoscopy N/A 04/14/2013    Procedure: COLONOSCOPY;  Surgeon: Garlan Fair, MD;  Location: [facility];  Service: Endoscopy;  Laterality: N/A;  . Knee arthroscopy Bilateral RIGHT 2002 & 1994/   LEFT  1995  . 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 Rotator cuff repair Right 08-31-2008  . Laparoscopy assisted  right colectomy  03-22-2005    CECUM POLYP  . Abdominal hysterectomy  1995    W/ UNILATERAL SALPINGOOPHORECTOMY  . Dorsal compartment release Left 09/24/2013    Procedure: LEFT WRIST FIRST DORSAL COMPARTMENT TENOSYNOVITIS;  Surgeon: Linna Hoff, MD;  Location: [facility];  Service: Orthopedics;  Laterality: Left;  ANESTHESIA: LOCAL/IV SEDATION   History reviewed. No pertinent family history. History  Substance Use Topics  . Smoking status: Former Smoker -- 2 years    Types: Cigarettes    Quit date: 08/21/1959  . Smokeless tobacco: Never Used  . Alcohol Use: No   OB History   Grav Para Term Preterm Abortions TAB SAB Ect Mult Living                 Review of Systems  Constitutional: Negative for fever and chills.  Respiratory: Negative for chest tightness and shortness of breath.   Cardiovascular: Negative for chest pain.  Musculoskeletal: Positive for arthralgias (Left knee).  Skin: Negative for color change.  Neurological: Negative for weakness and numbness.      Allergies  Review of patient's allergies indicates no known allergies.  Home Medications   Prior to Admission medications   Medication Sig Start Date End Date Taking? Authorizing Duran Ohern  acetaminophen (TYLENOL) 325 MG tablet Take 1 tablet (325 mg total) by mouth every 6 (six) hours as needed for moderate pain. 02/12/14   Marissa Sciacca, PA-C  acetaminophen  (TYLENOL) 500 MG tablet Take 500 mg by mouth every 6 (six) hours as needed.    Historical Turner Baillie, MD  amLODipine (NORVASC) 10 MG tablet Take 10 mg by mouth every morning.     Historical Limuel Nieblas, MD  aspirin 81 MG tablet Take 81 mg by mouth daily.    Historical Shahed Yeoman, MD  docusate sodium (COLACE) 100 MG capsule Take 1 capsule (100 mg total) by mouth 2 (two) times daily. 09/24/13   Linna Hoff, MD  Hydrocodone-Acetaminophen (VICODIN) 5-300 MG TABS Take 1 tablet by mouth 4 (four) times daily as needed (PAIN). 09/24/13   Linna Hoff, MD  losartan (COZAAR) 25 MG tablet Take 25 mg by mouth every morning.     Historical Elizeth Weinrich, MD  metFORMIN (GLUCOPHAGE) 1000 MG tablet Take 1,000 mg by mouth 2 (two) times daily with a meal.    Historical Dot Splinter, MD  naproxen sodium (ANAPROX) 220 MG tablet Take 220 mg by mouth as needed.    Historical Risa Auman, MD  potassium chloride (K-DUR) 10 MEQ tablet Take 10 mEq by mouth 2 (two) times daily.    Historical Laketra Bowdish, MD   BP 164/93  Pulse 63  Temp(Src) 97.8 F (36.6 C) (Oral)  Resp 16  Ht 5\' 2"  (1.575 m)  Wt 132 lb (59.875 kg)  BMI 24.14 kg/m2  SpO2 100% Physical Exam  Nursing note and vitals reviewed. Constitutional: She is oriented to person, place, and time. She appears well-developed and well-nourished. No distress.  HENT:  Head: Normocephalic and atraumatic.  Mouth/Throat: Oropharynx is clear and moist. No oropharyngeal exudate.  Eyes: Conjunctivae and EOM are normal. Pupils are equal, round, and reactive to light. Right eye exhibits no discharge. Left eye exhibits no discharge.  Neck: Normal range of motion. Neck supple. No tracheal deviation present.  Cardiovascular: Normal rate, regular rhythm and normal heart sounds.  Exam reveals no friction rub.   No murmur heard. Pulmonary/Chest: Effort normal and breath sounds normal. No respiratory distress. She has no wheezes. She has no rales.  Musculoskeletal: She exhibits tenderness. She  exhibits no edema.       Left knee: She exhibits decreased range of motion (Extension secondary to pain). She exhibits no swelling, no effusion, no ecchymosis, no deformity, no laceration and no erythema. Tenderness found. Lateral joint line tenderness noted.       Legs: Negative swelling, erythema, inflammation, lesions, sores, deformities identified to the left knee. Discomfort upon palpation to the suprapatella and lateral aspect of the left knee. Patient is able to flex knee without difficulty, discomfort upon extension of the left knee. Full range of motion to the left ankle without difficulty. Full range of motion to the digits of the left foot without difficulty.  Lymphadenopathy:    She has no cervical adenopathy.  Neurological: She is alert and oriented to person, place, and time. No cranial nerve deficit. She exhibits normal muscle tone. Coordination normal.  Cranial nerves III-XII grossly intact Strength 5+/5+ to lower extremities bilaterally with  resistance applied, equal distribution noted Strength intact to digits of the feet bilaterally Sensation intact with differentiation to sharp and dull touch Gait proper, proper balance - negative sway, negative drift, negative step-offs  Skin: Skin is warm and dry. No rash noted. She is not diaphoretic. No erythema.  Psychiatric: She has a normal mood and affect. Her behavior is normal. Thought content normal.    ED Course  Procedures (including critical care time) Labs Review Labs Reviewed - No data to display  Imaging Review Dg Knee Complete 4 Views Left  02/11/2014   CLINICAL DATA:  KNEE PAIN  EXAM: LEFT KNEE - COMPLETE 4+ VIEW  COMPARISON:  None available  FINDINGS: No acute fracture or dislocation. Severe tricompartmental degenerative osteoarthrosis as evidenced by a joint space narrowing, subchondral sclerosis, and juxta-articular osteophytosis is present, greatest within the lateral femoral tibial and patellofemoral joint space  compartments. Small joint effusion is present.  Diffuse osteopenia.  No soft tissue abnormality.  IMPRESSION: 1. Severe tricompartmental degenerative osteoarthrosis, greatest within the lateral femoral tibial and patellofemoral joint space compartments. 2. Small joint effusion. 3. No acute fracture dislocation. 4. Diffuse osteopenia.   Electronically Signed   By: Jeannine Boga M.D.   On: 02/11/2014 22:06     EKG Interpretation None      MDM   Final diagnoses:  Primary osteoarthritis of left knee  Joint effusion    Filed Vitals:   02/11/14 2108 02/11/14 2348  BP: 150/73 164/93  Pulse: 92 63  Temp: 97.8 F (36.6 C)   TempSrc: Oral   Resp: 20 16  Height: 5\' 2"  (1.575 m)   Weight: 132 lb (59.875 kg)   SpO2: 98% 100%   Plain film of left knee identified severe tricompartmental degenerative osteoarthrosis greatest within the lateral femoral tibial and patellofemoral joint spaces with small joint effusion-no acute fracture dislocation noted. Diffuse osteopenia identified. Doubt gout. Doubt ischemia. Doubt compartment syndrome. Doubt septic joint. Suspicion to be a flareup of arthritis secondary to stress on the leg and working. Negative focal neurological deficits noted. Negative signs of infection. Pulses palpable and strong-DP and PT bilaterally. Full range of motion to the left ankle and digits of the left foot. Patient stable, afebrile. Discharged patient. Referred patient to PCP and orthopedics. Patient placed in knee sleeve for comfort. Discussed with patient to rest, ice, elevate. Discussed with patient to avoid any physical strenuous activity. Discussed with patient to closely monitor symptoms and if symptoms are to worsen or change to report back to the ED - strict return instructions given.  Patient agreed to plan of care, understood, all questions answered.   Jamse Mead, PA-C 02/12/14 [phone]

## 2014-02-12 NOTE — ED Provider Notes (Signed)
Medical screening examination/treatment/procedure(s) were performed by non-physician practitioner and as supervising physician I was immediately available for consultation/collaboration.   EKG Interpretation None       Kalman Drape, MD 02/12/14 [phone]

## 2014-03-29 ENCOUNTER — Ambulatory Visit (INDEPENDENT_AMBULATORY_CARE_PROVIDER_SITE_OTHER): Payer: 59 | Admitting: Family Medicine

## 2014-03-29 VITALS — BP 130/70 | HR 84 | Temp 97.4°F | Resp 16 | Ht 62.0 in | Wt 136.0 lb

## 2014-03-29 DIAGNOSIS — M1710 Unilateral primary osteoarthritis, unspecified knee: Secondary | ICD-10-CM

## 2014-03-29 DIAGNOSIS — M25562 Pain in left knee: Secondary | ICD-10-CM

## 2014-03-29 DIAGNOSIS — M25569 Pain in unspecified knee: Secondary | ICD-10-CM

## 2014-03-29 DIAGNOSIS — M1712 Unilateral primary osteoarthritis, left knee: Secondary | ICD-10-CM

## 2014-03-29 MED ORDER — TRAMADOL HCL 50 MG PO TABS: 50.0000 mg | ORAL_TABLET | Freq: Three times a day (TID) | ORAL | Status: DC | PRN

## 2014-03-29 NOTE — Progress Notes (Signed)
[facility] [address] [place] [address] [phone]  Date:  03/29/2014   Name:  Savannah Lewis   DOB:  [birth year]   MRN:  [id number]  PCP:  Irven Shelling, MD    Chief Complaint: Knee Pain   History of Present Illness:  Savannah Lewis is a 73 y.o. very pleasant female patient who presents with the following:  Here today to discuss persistent left knee pain.  She has a history of OA in her knees. Generally she had not needed any medication for this.  However she noted worsening left knee pain in June and had x-rays at the ED.  She has been seen at [facility] and had #3 injections of Euflexxa so far per her report. She reports she had her last injection about 2 weeks ago, noted onset of more pain about 5 days ago.  No particualar recent injury.   She notes more pain laterally.  She states she has not yet had an MRI that she can recall.  She has an appt with ortho on 8/28, but did not call them yet to report her increased pain.    Left knee films 02/11/14 LEFT KNEE - COMPLETE 4+ VIEW  COMPARISON: None available  FINDINGS:  No acute fracture or dislocation. Severe tricompartmental  degenerative osteoarthrosis as evidenced by a joint space narrowing,  subchondral sclerosis, and juxta-articular osteophytosis is present,  greatest within the lateral femoral tibial and patellofemoral joint  space compartments. Small joint effusion is present.  Diffuse osteopenia.  No soft tissue abnormality.  IMPRESSION:  1. Severe tricompartmental degenerative osteoarthrosis, greatest  within the lateral femoral tibial and patellofemoral joint space  compartments.  2. Small joint effusion.  3. No acute fracture dislocation.  4. Diffuse osteopenia.   She was given some vicodin 5 in February of this year for pain per Dr. Caralyn Guile and was able to tolerate this ok . She has an appt to see Dr. Theda Sers on 8/28.  She is s/p right knee replacement already  Patient Active  Problem List   Diagnosis Date Noted  . Thoracic ascending aortic aneurysm   . Hypertension   . Arthritis   . Diabetes   . Diabetes mellitus 09/06/2012  . HTN (hypertension) 09/06/2012    Past Medical History  Diagnosis Date  . Hypertension   . Type 2 diabetes mellitus   . History of Bell's palsy     2003  RIGHT SIDE-  resolved  . Thoracic ascending aortic aneurysm     4.1 CM  STABLE SINCE 2004 PER DR VANTRIGHT NOTE 02/2013  . Diverticulosis   . 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 De Quervain's tenosynovitis, left     WRIST  . Diabetic retinopathy, nonproliferative   . DJD (degenerative joint disease)     left knee  . DDD (degenerative disc disease), cervical   . Cervical spondylosis   . TMJ (temporomandibular joint syndrome)   . Wears glasses     Past Surgical History  Procedure Laterality Date  . Colonoscopy N/A 04/14/2013    Procedure: COLONOSCOPY;  Surgeon: Garlan Fair, MD;  Location: [facility];  Service: Endoscopy;  Laterality: N/A;  . Knee arthroscopy Bilateral RIGHT 2002 & 1994/   LEFT  1995  . 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 Rotator cuff repair Right 08-31-2008  . Laparoscopy assisted  right colectomy  03-22-2005  CECUM POLYP  . Abdominal hysterectomy  1995    W/ UNILATERAL SALPINGOOPHORECTOMY  . Dorsal compartment release Left 09/24/2013    Procedure: LEFT WRIST FIRST DORSAL COMPARTMENT TENOSYNOVITIS;  Surgeon: Linna Hoff, MD;  Location: [facility];  Service: Orthopedics;  Laterality: Left;  ANESTHESIA: LOCAL/IV SEDATION    History  Substance Use Topics  . Smoking status: Former Smoker -- 2 years    Types: Cigarettes    Quit date: 08/21/1959  . Smokeless tobacco: Never Used  . Alcohol Use: No    History reviewed. No pertinent family history.  No Known Allergies  Medication list has been reviewed and updated.  Current Outpatient Prescriptions  on File Prior to Visit  Medication Sig Dispense Refill  . acetaminophen (TYLENOL) 325 MG tablet Take 1 tablet (325 mg total) by mouth every 6 (six) hours as needed for moderate pain.  11 tablet  0  . acetaminophen (TYLENOL) 500 MG tablet Take 500 mg by mouth every 6 (six) hours as needed.      Marland Kitchen amLODipine (NORVASC) 10 MG tablet Take 10 mg by mouth every morning.       Marland Kitchen aspirin 81 MG tablet Take 81 mg by mouth daily.      Marland Kitchen docusate sodium (COLACE) 100 MG capsule Take 1 capsule (100 mg total) by mouth 2 (two) times daily.  30 capsule  0  . Hydrocodone-Acetaminophen (VICODIN) 5-300 MG TABS Take 1 tablet by mouth 4 (four) times daily as needed (PAIN).  40 each  0  . losartan (COZAAR) 25 MG tablet Take 25 mg by mouth every morning.       . metFORMIN (GLUCOPHAGE) 1000 MG tablet Take 1,000 mg by mouth 2 (two) times daily with a meal.      . naproxen sodium (ANAPROX) 220 MG tablet Take 220 mg by mouth as needed.      . potassium chloride (K-DUR) 10 MEQ tablet Take 10 mEq by mouth 2 (two) times daily.       No current facility-administered medications on file prior to visit.    Review of Systems:  As per HPI- otherwise negative.   Physical Examination: Filed Vitals:   03/29/14 1007  BP: 130/70  Pulse: 84  Temp: 97.4 F (36.3 C)  Resp: 16   Filed Vitals:   03/29/14 1007  Height: 5\' 2"  (1.575 m)  Weight: 136 lb (61.689 kg)   Body mass index is 24.87 kg/(m^2). Ideal Body Weight: Weight in (lb) to have BMI = 25: 136.4  GEN: WDWN, NAD, Non-toxic, A & O x 3, looks well HEENT: Atraumatic, Normocephalic. Neck supple. No masses, No LAD. Ears and Nose: No external deformity. CV: RRR, No M/G/R. No JVD. No thrill. No extra heart sounds. PULM: CTA B, no wheezes, crackles, rhonchi. No retractions. No resp. distress. No accessory muscle use. ABD: S, NT, ND, +BS. No rebound. No HSM. EXTR: No c/c/e NEURO Normal gait.  PSYCH: Normally interactive. Conversant. Not depressed or anxious appearing.   Calm demeanor.  Left knee; no significant effusion noted. She has crepitus and some stiffness with flexion and extension.  Tender over the lateral joint line.  No evidence of infection or gout- no redness or heat, no swelling  I called [facility] and was able to get her a follow-up appt for later this week  Assessment and Plan: Left knee pain - Plan: traMADol (ULTRAM) 50 MG tablet  Primary osteoarthritis of left knee  Labrittany is here today with left knee  pain.  Explained that generally it would be best for her to see ortho for this issue as they are already treating her with advanced modalities. Make an appt for her this Thursday. She has a hinged knee brace that seems to fit well. Tramadol as needed for pain  Signed Lamar Blinks, MD

## 2014-03-29 NOTE — Patient Instructions (Addendum)
Please go to [facility] for follow- up of your left knee pain on Thursday 8/13 at 3:15 pm.  In the meantime you may use your knee brace and pain medication as needed- don't forget this can make you a little drowsy

## 2014-07-01 ENCOUNTER — Encounter (HOSPITAL_COMMUNITY): Payer: Self-pay

## 2014-07-01 ENCOUNTER — Emergency Department (HOSPITAL_COMMUNITY)
Admission: EM | Admit: 2014-07-01 | Discharge: 2014-07-01 | Disposition: A | Discharge status: Home / Self Care | Payer: PRIVATE HEALTH INSURANCE | Attending: Emergency Medicine | Admitting: Emergency Medicine

## 2014-07-01 DIAGNOSIS — W208XXA Other cause of strike by thrown, projected or falling object, initial encounter: Secondary | ICD-10-CM | POA: Insufficient documentation

## 2014-07-01 DIAGNOSIS — Y92000 Kitchen of unspecified non-institutional (private) residence as  the place of occurrence of the external cause: Secondary | ICD-10-CM | POA: Diagnosis not present

## 2014-07-01 DIAGNOSIS — Z79899 Other long term (current) drug therapy: Secondary | ICD-10-CM | POA: Insufficient documentation

## 2014-07-01 DIAGNOSIS — Z7982 Long term (current) use of aspirin: Secondary | ICD-10-CM | POA: Insufficient documentation

## 2014-07-01 DIAGNOSIS — S8011XA Contusion of right lower leg, initial encounter: Secondary | ICD-10-CM | POA: Diagnosis not present

## 2014-07-01 DIAGNOSIS — I10 Essential (primary) hypertension: Secondary | ICD-10-CM | POA: Diagnosis not present

## 2014-07-01 DIAGNOSIS — Z8739 Personal history of other diseases of the musculoskeletal system and connective tissue: Secondary | ICD-10-CM | POA: Diagnosis not present

## 2014-07-01 DIAGNOSIS — Y9389 Activity, other specified: Secondary | ICD-10-CM | POA: Diagnosis not present

## 2014-07-01 DIAGNOSIS — Z87891 Personal history of nicotine dependence: Secondary | ICD-10-CM | POA: Diagnosis not present

## 2014-07-01 DIAGNOSIS — E11329 Type 2 diabetes mellitus with mild nonproliferative diabetic retinopathy without macular edema: Secondary | ICD-10-CM | POA: Diagnosis not present

## 2014-07-01 DIAGNOSIS — W228XXA Striking against or struck by other objects, initial encounter: Secondary | ICD-10-CM | POA: Insufficient documentation

## 2014-07-01 DIAGNOSIS — Z8669 Personal history of other diseases of the nervous system and sense organs: Secondary | ICD-10-CM | POA: Diagnosis not present

## 2014-07-01 DIAGNOSIS — Z86010 Personal history of colonic polyps: Secondary | ICD-10-CM | POA: Diagnosis not present

## 2014-07-01 DIAGNOSIS — Y998 Other external cause status: Secondary | ICD-10-CM | POA: Insufficient documentation

## 2014-07-01 DIAGNOSIS — T148XXA Other injury of unspecified body region, initial encounter: Secondary | ICD-10-CM

## 2014-07-01 DIAGNOSIS — Z8719 Personal history of other diseases of the digestive system: Secondary | ICD-10-CM | POA: Diagnosis not present

## 2014-07-01 DIAGNOSIS — S8991XA Unspecified injury of right lower leg, initial encounter: Secondary | ICD-10-CM | POA: Diagnosis present

## 2014-07-01 MED ORDER — IBUPROFEN 400 MG PO TABS
400.0000 mg | ORAL_TABLET | Freq: Once | ORAL | Status: AC
  Administered 2014-07-01: 400 mg via ORAL
  Filled 2014-07-01: qty 1

## 2014-07-01 NOTE — ED Notes (Signed)
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 Has a bruise to her leg and just wants it checked.

## 2014-07-01 NOTE — Discharge Instructions (Signed)
Cryotherapy °Cryotherapy means treatment with cold. Ice or gel packs can be used to reduce both pain and swelling. Ice is the most helpful within the first 24 to 48 hours after an injury or flare-up from overusing a muscle or joint. Sprains, strains, spasms, burning pain, shooting pain, and aches can all be eased with ice. Ice can also be used when recovering from surgery. Ice is effective, has very few side effects, and is safe for most people to use. °PRECAUTIONS  °Ice is not a safe treatment option for people with: °· Raynaud phenomenon. This is a condition affecting small blood vessels in the extremities. Exposure to cold may cause your problems to return. °· Cold hypersensitivity. There are many forms of cold hypersensitivity, including: °¨ Cold urticaria. Red, itchy hives appear on the skin when the tissues begin to warm after being iced. °¨ Cold erythema. This is a red, itchy rash caused by exposure to cold. °¨ Cold hemoglobinuria. Red blood cells break down when the tissues begin to warm after being iced. The hemoglobin that carry oxygen are passed into the urine because they cannot combine with blood proteins fast enough. °· Numbness or altered sensitivity in the area being iced. °If you have any of the following conditions, do not use ice until you have discussed cryotherapy with your caregiver: °· Heart conditions, such as arrhythmia, angina, or chronic heart disease. °· High blood pressure. °· Healing wounds or open skin in the area being iced. °· Current infections. °· Rheumatoid arthritis. °· Poor circulation. °· Diabetes. °Ice slows the blood flow in the region it is applied. This is beneficial when trying to stop inflamed tissues from spreading irritating chemicals to surrounding tissues. However, if you expose your skin to cold temperatures for too long or without the proper protection, you can damage your skin or nerves. Watch for signs of skin damage due to cold. °HOME CARE INSTRUCTIONS °Follow  these tips to use ice and cold packs safely. °· Place a dry or damp towel between the ice and skin. A damp towel will cool the skin more quickly, so you may need to shorten the time that the ice is used. °· For a more rapid response, add gentle compression to the ice. °· Ice for no more than 10 to 20 minutes at a time. The bonier the area you are icing, the less time it will take to get the benefits of ice. °· Check your skin after 5 minutes to make sure there are no signs of a poor response to cold or skin damage. °· Rest 20 minutes or more between uses. °· Once your skin is numb, you can end your treatment. You can test numbness by very lightly touching your skin. The touch should be so light that you do not see the skin dimple from the pressure of your fingertip. When using ice, most people will feel these normal sensations in this order: cold, burning, aching, and numbness. °· Do not use ice on someone who cannot communicate their responses to pain, such as small children or people with dementia. °HOW TO MAKE AN ICE PACK °Ice packs are the most common way to use ice therapy. Other methods include ice massage, ice baths, and cryosprays. Muscle creams that cause a cold, tingly feeling do not offer the same benefits that ice offers and should not be used as a substitute unless recommended by your caregiver. °To make an ice pack, do one of the following: °· Place crushed ice or a   bag of frozen vegetables in a sealable plastic bag. Squeeze out the excess air. Place this bag inside another plastic bag. Slide the bag into a pillowcase or place a damp towel between your skin and the bag.  Mix 3 parts water with 1 part rubbing alcohol. Freeze the mixture in a sealable plastic bag. When you remove the mixture from the freezer, it will be slushy. Squeeze out the excess air. Place this bag inside another plastic bag. Slide the bag into a pillowcase or place a damp towel between your skin and the bag. SEEK MEDICAL CARE  IF:  You develop white spots on your skin. This may give the skin a blotchy (mottled) appearance.  Your skin turns blue or pale.  Your skin becomes waxy or hard.  Your swelling gets worse. MAKE SURE YOU:   Understand these instructions.  Will watch your condition.  Will get help right away if you are not doing well or get worse. Document Released: 04/02/2011 Document Revised: 12/21/2013 Document Reviewed: 04/02/2011 [facility] Patient Information 2015 [place], [place]. This information is not intended to replace advice given to you by your health care provider. Make sure you discuss any questions you have with your health care provider.  Contusion A contusion is a deep bruise. Contusions happen when an injury causes bleeding under the skin. Signs of bruising include pain, puffiness (swelling), and discolored skin. The contusion may turn blue, purple, or yellow. HOME CARE   Put ice on the injured area.  Put ice in a plastic bag.  Place a towel between your skin and the bag.  Leave the ice on for 15-20 minutes, 03-04 times a day.  Only take medicine as told by your doctor.  Rest the injured area.  If possible, raise (elevate) the injured area to lessen puffiness. GET HELP RIGHT AWAY IF:   You have more bruising or puffiness.  You have pain that is getting worse.  Your puffiness or pain is not helped by medicine. MAKE SURE YOU:   Understand these instructions.  Will watch your condition.  Will get help right away if you are not doing well or get worse. Document Released: 01/23/2008 Document Revised: 10/29/2011 Document Reviewed: 06/11/2011 [employer] Patient Information 2015 [place], [place]. This information is not intended to replace advice given to you by your health care provider. Make sure you discuss any questions you have with your health care provider. Try to ice and elevate the area tonight

## 2014-07-01 NOTE — ED Notes (Signed)
Declined W/C at D/C and was escorted to lobby by RN. 

## 2014-07-01 NOTE — ED Provider Notes (Signed)
CSN: [id number]     Arrival date & time 07/01/14  1843 History   First MD Initiated Contact with Patient 07/01/14 2002     Chief Complaint  Patient presents with  . Leg Injury     (Consider location/radiation/quality/duration/timing/severity/associated sxs/prior Treatment) HPI Comments: A stack of cooking pans fell and hit her on the side of R lower leg now with discoloration in 3 distant areas  Minimal pain   The history is provided by the patient.    Past Medical History  Diagnosis Date  . Hypertension   . Type 2 diabetes mellitus   . History of Bell's palsy     2003  RIGHT SIDE-  resolved  . Thoracic ascending aortic aneurysm     4.1 CM  STABLE SINCE 2004 PER DR VANTRIGHT NOTE 02/2013  . Diverticulosis   . 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 De Quervain's tenosynovitis, left     WRIST  . Diabetic retinopathy, nonproliferative   . DJD (degenerative joint disease)     left knee  . DDD (degenerative disc disease), cervical   . Cervical spondylosis   . TMJ (temporomandibular joint syndrome)   . Wears glasses    Past Surgical History  Procedure Laterality Date  . Colonoscopy N/A 04/14/2013    Procedure: COLONOSCOPY;  Surgeon: Garlan Fair, MD;  Location: [facility];  Service: Endoscopy;  Laterality: N/A;  . Knee arthroscopy Bilateral RIGHT 2002 & 1994/   LEFT  1995  . 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 Rotator cuff repair Right 08-31-2008  . Laparoscopy assisted  right colectomy  03-22-2005    CECUM POLYP  . Abdominal hysterectomy  1995    W/ UNILATERAL SALPINGOOPHORECTOMY  . Dorsal compartment release Left 09/24/2013    Procedure: LEFT WRIST FIRST DORSAL COMPARTMENT TENOSYNOVITIS;  Surgeon: Linna Hoff, MD;  Location: [facility];  Service: Orthopedics;  Laterality: Left;  ANESTHESIA: LOCAL/IV SEDATION   No family history on file. History  Substance Use Topics  .  Smoking status: Former Smoker -- 2 years    Types: Cigarettes    Quit date: 08/21/1959  . Smokeless tobacco: Never Used  . Alcohol Use: No   OB History    No data available     Review of Systems  Constitutional: Negative for fever.  HENT: Negative.   Eyes: Negative.   Cardiovascular: Negative.   Gastrointestinal: Negative.   Musculoskeletal: Positive for myalgias. Negative for joint swelling.  Skin: Positive for color change. Negative for wound.  Neurological: Negative for dizziness, weakness, numbness and headaches.  All other systems reviewed and are negative.     Allergies  Review of patient's allergies indicates no known allergies.  Home Medications   Prior to Admission medications   Medication Sig Start Date End Date Taking? Authorizing Provider  acetaminophen (TYLENOL) 325 MG tablet Take 1 tablet (325 mg total) by mouth every 6 (six) hours as needed for moderate pain. 02/12/14   Marissa Sciacca, PA-C  acetaminophen (TYLENOL) 500 MG tablet Take 500 mg by mouth every 6 (six) hours as needed.    Historical Provider, MD  amLODipine (NORVASC) 10 MG tablet Take 10 mg by mouth every morning.     Historical Provider, MD  aspirin 81 MG tablet Take 81 mg by mouth daily.    Historical Provider, MD  docusate sodium (COLACE) 100 MG capsule Take 1 capsule (100 mg total)  by mouth 2 (two) times daily. 09/24/13   Linna Hoff, MD  Hydrocodone-Acetaminophen (VICODIN) 5-300 MG TABS Take 1 tablet by mouth 4 (four) times daily as needed (PAIN). 09/24/13   Linna Hoff, MD  losartan (COZAAR) 25 MG tablet Take 25 mg by mouth every morning.     Historical Provider, MD  metFORMIN (GLUCOPHAGE) 1000 MG tablet Take 1,000 mg by mouth 2 (two) times daily with a meal.    Historical Provider, MD  naproxen sodium (ANAPROX) 220 MG tablet Take 220 mg by mouth as needed.    Historical Provider, MD  potassium chloride (K-DUR) 10 MEQ tablet Take 10 mEq by mouth 2 (two) times daily.    Historical Provider, MD   traMADol (ULTRAM) 50 MG tablet Take 1 tablet (50 mg total) by mouth every 8 (eight) hours as needed. 03/29/14   Gay Filler Copland, MD   BP 153/81 mmHg  Pulse 110  Temp(Src) 98.2 F (36.8 C) (Oral)  Resp 20  SpO2 98% Physical Exam  Constitutional: She appears well-developed and well-nourished.  Eyes: Pupils are equal, round, and reactive to light.  Neck: Normal range of motion.  Cardiovascular: Normal rate and regular rhythm.   Pulmonary/Chest: Effort normal.  Musculoskeletal: Normal range of motion.  Neurological: She is alert.  Skin: Skin is warm. No erythema.  Discoloration to lateral lower leg first area 8CM x%cm  Second area 3CM x5CM third moe superficial and 2X2   Psychiatric: She has a normal mood and affect.  Nursing note and vitals reviewed.   ED Course  Procedures (including critical care time) Labs Review Labs Reviewed - No data to display  Imaging Review No results found.   EKG Interpretation None     Patient is ambulatory, will take her home meds for pain and FU with PCP  MDM   Final diagnoses:  Contusion         Garald Balding, NP 07/01/14 3785  Virgel Manifold, MD 07/08/14 1013

## 2014-07-08 ENCOUNTER — Emergency Department (HOSPITAL_COMMUNITY): Payer: 59

## 2014-07-08 ENCOUNTER — Encounter (HOSPITAL_COMMUNITY): Payer: Self-pay | Admitting: Emergency Medicine

## 2014-07-08 ENCOUNTER — Emergency Department (HOSPITAL_COMMUNITY)
Admission: EM | Admit: 2014-07-08 | Discharge: 2014-07-08 | Disposition: A | Discharge status: Home / Self Care | Payer: 59 | Attending: Emergency Medicine | Admitting: Emergency Medicine

## 2014-07-08 DIAGNOSIS — Z87891 Personal history of nicotine dependence: Secondary | ICD-10-CM | POA: Diagnosis not present

## 2014-07-08 DIAGNOSIS — Z973 Presence of spectacles and contact lenses: Secondary | ICD-10-CM | POA: Diagnosis not present

## 2014-07-08 DIAGNOSIS — E876 Hypokalemia: Secondary | ICD-10-CM | POA: Diagnosis not present

## 2014-07-08 DIAGNOSIS — Z8739 Personal history of other diseases of the musculoskeletal system and connective tissue: Secondary | ICD-10-CM | POA: Insufficient documentation

## 2014-07-08 DIAGNOSIS — Z79899 Other long term (current) drug therapy: Secondary | ICD-10-CM | POA: Insufficient documentation

## 2014-07-08 DIAGNOSIS — Z86010 Personal history of colonic polyps: Secondary | ICD-10-CM | POA: Insufficient documentation

## 2014-07-08 DIAGNOSIS — Z7982 Long term (current) use of aspirin: Secondary | ICD-10-CM | POA: Diagnosis not present

## 2014-07-08 DIAGNOSIS — Z8719 Personal history of other diseases of the digestive system: Secondary | ICD-10-CM | POA: Insufficient documentation

## 2014-07-08 DIAGNOSIS — I10 Essential (primary) hypertension: Secondary | ICD-10-CM | POA: Diagnosis not present

## 2014-07-08 DIAGNOSIS — E11329 Type 2 diabetes mellitus with mild nonproliferative diabetic retinopathy without macular edema: Secondary | ICD-10-CM | POA: Insufficient documentation

## 2014-07-08 DIAGNOSIS — H538 Other visual disturbances: Secondary | ICD-10-CM | POA: Diagnosis present

## 2014-07-08 DIAGNOSIS — Z8669 Personal history of other diseases of the nervous system and sense organs: Secondary | ICD-10-CM | POA: Insufficient documentation

## 2014-07-08 LAB — COMPREHENSIVE METABOLIC PANEL
ALBUMIN: 3.7 g/dL (ref 3.5–5.2)
ALK PHOS: 79 U/L (ref 39–117)
ALT: 21 U/L (ref 0–35)
AST: 24 U/L (ref 0–37)
Anion gap: 16 — ABNORMAL HIGH (ref 5–15)
BILIRUBIN TOTAL: 0.2 mg/dL — AB (ref 0.3–1.2)
BUN: 15 mg/dL (ref 6–23)
CO2: 23 mEq/L (ref 19–32)
Calcium: 9.7 mg/dL (ref 8.4–10.5)
Chloride: 102 mEq/L (ref 96–112)
Creatinine, Ser: 0.69 mg/dL (ref 0.50–1.10)
GFR calc Af Amer: >90 mL/min (ref >90)
GFR calc non Af Amer: 85 mL/min — ABNORMAL LOW (ref >90)
Glucose, Bld: 96 mg/dL (ref 70–99)
Potassium: 3.3 mEq/L — ABNORMAL LOW (ref 3.7–5.3)
SODIUM: 141 meq/L (ref 137–147)
Total Protein: 7.3 g/dL (ref 6.0–8.3)

## 2014-07-08 LAB — CBC WITH DIFFERENTIAL/PLATELET
BASOS PCT: 1 % (ref 0–1)
Basophils Absolute: 0 10*3/uL (ref 0.0–0.1)
Eosinophils Absolute: 0.1 10*3/uL (ref 0.0–0.7)
Eosinophils Relative: 3 % (ref 0–5)
HCT: 33.2 % — ABNORMAL LOW (ref 36.0–46.0)
Hemoglobin: 10.8 g/dL — ABNORMAL LOW (ref 12.0–15.0)
LYMPHS PCT: 43 % (ref 12–46)
Lymphs Abs: 1.7 10*3/uL (ref 0.7–4.0)
MCH: 26.3 pg (ref 26.0–34.0)
MCHC: 32.5 g/dL (ref 30.0–36.0)
MCV: 81 fL (ref 78.0–100.0)
Monocytes Absolute: 0.3 10*3/uL (ref 0.1–1.0)
Monocytes Relative: 9 % (ref 3–12)
NEUTROS ABS: 1.8 10*3/uL (ref 1.7–7.7)
NEUTROS PCT: 44 % (ref 43–77)
Platelets: 217 10*3/uL (ref 150–400)
RBC: 4.1 MIL/uL (ref 3.87–5.11)
RDW: 14.9 % (ref 11.5–15.5)
WBC: 3.9 10*3/uL — AB (ref 4.0–10.5)

## 2014-07-08 MED ORDER — POTASSIUM CHLORIDE CRYS ER 20 MEQ PO TBCR
20.0000 meq | EXTENDED_RELEASE_TABLET | Freq: Once | ORAL | Status: AC
  Administered 2014-07-08: 20 meq via ORAL
  Filled 2014-07-08: qty 1

## 2014-07-08 NOTE — ED Notes (Signed)
Dr. Yao at bedside. 

## 2014-07-08 NOTE — ED Notes (Signed)
Pt reports episode of blurred vision that lasted a few mins today. sts vision is back to normal now. Denies weakness/numbness. Stroke scale negative.

## 2014-07-08 NOTE — ED Notes (Signed)
Pt A&OX4, ambulatory at d/c with steady gait, NAD 

## 2014-07-08 NOTE — ED Provider Notes (Signed)
CSN: [id number]     Arrival date & time 07/08/14  1703 History   First MD Initiated Contact with Patient 07/08/14 2032     Chief Complaint  Patient presents with  . Blurred Vision     (Consider location/radiation/quality/duration/timing/severity/associated sxs/prior Treatment) The history is provided by the patient.  Savannah Lewis is a 73 y.o. female hx of HTN, DM, here with blurry vision. She works in [employer]. At 5 PM had an acute onset of blurry vision and was unable to read them any for several minutes. Denies any any associated weakness or numbness or trouble speaking. She denies any history of stroke. Denies associated headache.    Past Medical History  Diagnosis Date  . Hypertension   . Type 2 diabetes mellitus   . History of Bell's palsy     2003  RIGHT SIDE-  resolved  . Thoracic ascending aortic aneurysm     4.1 CM  STABLE SINCE 2004 PER DR VANTRIGHT NOTE 02/2013  . Diverticulosis   . 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 De Quervain's tenosynovitis, left     WRIST  . Diabetic retinopathy, nonproliferative   . DJD (degenerative joint disease)     left knee  . DDD (degenerative disc disease), cervical   . Cervical spondylosis   . TMJ (temporomandibular joint syndrome)   . Wears glasses    Past Surgical History  Procedure Laterality Date  . Colonoscopy N/A 04/14/2013    Procedure: COLONOSCOPY;  Surgeon: Garlan Fair, MD;  Location: [facility];  Service: Endoscopy;  Laterality: N/A;  . Knee arthroscopy Bilateral RIGHT 2002 & 1994/   LEFT  1995  . 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 Rotator cuff repair Right 08-31-2008  . Laparoscopy assisted  right colectomy  03-22-2005    CECUM POLYP  . Abdominal hysterectomy  1995    W/ UNILATERAL SALPINGOOPHORECTOMY  . Dorsal compartment release Left 09/24/2013    Procedure: LEFT WRIST FIRST DORSAL COMPARTMENT TENOSYNOVITIS;  Surgeon: Linna Hoff, MD;  Location: [facility];  Service: Orthopedics;  Laterality: Left;  ANESTHESIA: LOCAL/IV SEDATION   No family history on file. History  Substance Use Topics  . Smoking status: Former Smoker -- 2 years    Types: Cigarettes    Quit date: 08/21/1959  . Smokeless tobacco: Never Used  . Alcohol Use: No   OB History    No data available     Review of Systems  Neurological:       Blurry vision   All other systems reviewed and are negative.     Allergies  Review of patient's allergies indicates no known allergies.  Home Medications   Prior to Admission medications   Medication Sig Start Date End Date Taking? Authorizing Provider  acetaminophen (TYLENOL) 325 MG tablet Take 1 tablet (325 mg total) by mouth every 6 (six) hours as needed for moderate pain. 02/12/14  Yes Marissa Sciacca, PA-C  amLODipine (NORVASC) 10 MG tablet Take 10 mg by mouth every morning.    Yes Historical Provider, MD  aspirin 81 MG tablet Take 81 mg by mouth daily.   Yes Historical Provider, MD  cetirizine (ZYRTEC) 10 MG tablet Take 10 mg by mouth daily.   Yes Historical Provider, MD  losartan (COZAAR) 25 MG tablet Take 25 mg by mouth every morning.    Yes Historical Provider, MD  metFORMIN (GLUCOPHAGE) 1000  MG tablet Take 1,000 mg by mouth 2 (two) times daily with a meal.   Yes Historical Provider, MD  Multiple Vitamins-Minerals (MULTIVITAMIN WITH MINERALS) tablet Take 1 tablet by mouth daily.   Yes Historical Provider, MD  potassium chloride (K-DUR) 10 MEQ tablet Take 10 mEq by mouth 2 (two) times daily.   Yes Historical Provider, MD  docusate sodium (COLACE) 100 MG capsule Take 1 capsule (100 mg total) by mouth 2 (two) times daily. Patient not taking: Reported on 07/08/2014 09/24/13   Linna Hoff, MD  Hydrocodone-Acetaminophen (VICODIN) 5-300 MG TABS Take 1 tablet by mouth 4 (four) times daily as needed (PAIN). Patient not taking: Reported on 07/08/2014 09/24/13   Linna Hoff, MD   traMADol (ULTRAM) 50 MG tablet Take 1 tablet (50 mg total) by mouth every 8 (eight) hours as needed. Patient not taking: Reported on 07/08/2014 03/29/14   Gay Filler Copland, MD   BP 126/70 mmHg  Pulse 80  Temp(Src) 97 F (36.1 C)  Resp 18  SpO2 100% Physical Exam  Constitutional: She is oriented to person, place, and time. She appears well-developed and well-nourished.  HENT:  Head: Normocephalic.  Mouth/Throat: Oropharynx is clear and moist.  Eyes: Conjunctivae and EOM are normal. Pupils are equal, round, and reactive to light.  No obvious papilledema   Neck: Normal range of motion. Neck supple.  Cardiovascular: Normal rate, regular rhythm and normal heart sounds.   Pulmonary/Chest: Effort normal and breath sounds normal. No respiratory distress. She has no wheezes. She has no rales.  Abdominal: Soft. Bowel sounds are normal. She exhibits no distension. There is no tenderness. There is no rebound and no guarding.  Musculoskeletal: Normal range of motion. She exhibits no edema or tenderness.  Neurological: She is alert and oriented to person, place, and time. No cranial nerve deficit. Coordination normal.  CN 2-12 intact. Nl finger to nose. No pronator drift. Nl gait   Skin: Skin is dry.  Psychiatric: She has a normal mood and affect. Her behavior is normal. Judgment and thought content normal.  Nursing note and vitals reviewed.   ED Course  Procedures (including critical care time) Labs Review Labs Reviewed  CBC WITH DIFFERENTIAL - Abnormal; Notable for the following:    WBC 3.9 (*)    Hemoglobin 10.8 (*)    HCT 33.2 (*)    All other components within normal limits  COMPREHENSIVE METABOLIC PANEL - Abnormal; Notable for the following:    Potassium 3.3 (*)    Total Bilirubin 0.2 (*)    GFR calc non Af Amer 85 (*)    Anion gap 16 (*)    All other components within normal limits    Imaging Review Ct Head Wo Contrast  07/08/2014   CLINICAL DATA:  Bilateral blurred vision  tonight.  EXAM: CT HEAD WITHOUT CONTRAST  TECHNIQUE: Contiguous axial images were obtained from the base of the skull through the vertex without intravenous contrast.  COMPARISON:  None.  FINDINGS: No mass lesion. No midline shift. No acute hemorrhage or hematoma. No extra-axial fluid collections. No evidence of acute infarction. Brain parenchyma appears normal. No acute osseous abnormality. The patient has small sessile osteomas on the outer table of the left occipital bone and of left posterior parietal bone. There is arthritis of the left temporomandibular joint. Minimal opacification of 1 of the ethmoid air cells.  IMPRESSION: No acute abnormalities.   Electronically Signed   By: Rozetta Nunnery M.D.   On: 07/08/2014 21:32  EKG Interpretation None      MDM   Final diagnoses:  Blurry vision   Savannah Lewis is a 73 y.o. female here with blurry vision, resolved. Labs at baseline. CT head unremarkable. I called Dr. Corinna Lines, on call neurologist, who recommend patient continue ASA 81 mg daily and see neuro outpatient. Doesn't need further workup in the ED.    Wandra Arthurs, MD 07/08/14 [id number]

## 2014-07-08 NOTE — Discharge Instructions (Signed)
Take ASA 81 mg daily.   Follow up with neurologist outpatient.   Return to ER if you have worse blurry vision, trouble speaking, weakness, numbness.

## 2014-07-20 ENCOUNTER — Encounter (HOSPITAL_BASED_OUTPATIENT_CLINIC_OR_DEPARTMENT_OTHER): Payer: Self-pay | Admitting: Orthopedic Surgery

## 2014-07-22 ENCOUNTER — Encounter (HOSPITAL_BASED_OUTPATIENT_CLINIC_OR_DEPARTMENT_OTHER): Payer: Self-pay | Admitting: Orthopedic Surgery

## 2014-07-22 NOTE — Addendum Note (Signed)
Addendum  created 07/22/14 0959 by Mechele Claude, CRNA   Modules edited: Anesthesia Events, Narrator   Narrator:  Narrator: Event Log Edited

## 2014-08-09 ENCOUNTER — Other Ambulatory Visit: Payer: Self-pay | Admitting: Orthopedic Surgery

## 2014-08-09 NOTE — H&P (Signed)
TOTAL KNEE ADMISSION H&P  Patient is being admitted for left total knee arthroplasty.  Subjective:  Chief Complaint:left knee pain.  HPI: Savannah Lewis, 73 y.o. female, has a history of pain and functional disability in the left knee due to arthritis and has failed non-surgical conservative treatments for greater than 12 weeks to includeNSAID's and/or analgesics, corticosteriod injections, flexibility and strengthening excercises, use of assistive devices, weight reduction as appropriate and activity modification.  Onset of symptoms was gradual, starting 2 years ago with gradually worsening course since that time. The patient noted no past surgery on the left knee(s).  Patient currently rates pain in the left knee(s) at 8 out of 10 with activity. Patient has night pain, worsening of pain with activity and weight bearing, pain that interferes with activities of daily living, pain with passive range of motion and joint swelling.  Patient has evidence of joint space narrowing by imaging studies. This patient has had Osteoarthritis. There is no active infection.  Patient Active Problem List   Diagnosis Date Noted  . Thoracic ascending aortic aneurysm   . Hypertension   . Arthritis   . Diabetes   . Diabetes mellitus 09/06/2012  . HTN (hypertension) 09/06/2012   Past Medical History  Diagnosis Date  . Hypertension   . Type 2 diabetes mellitus   . History of Bell's palsy     2003  RIGHT SIDE-  resolved  . Thoracic ascending aortic aneurysm     4.1 CM  STABLE SINCE 2004 PER DR VANTRIGHT NOTE 02/2013  . Diverticulosis   . 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 De Quervain's tenosynovitis, left     WRIST  . Diabetic retinopathy, nonproliferative   . DJD (degenerative joint disease)     left knee  . DDD (degenerative disc disease), cervical   . Cervical spondylosis   . TMJ (temporomandibular joint syndrome)   . Wears glasses      Past Surgical History  Procedure Laterality Date  . Colonoscopy N/A 04/14/2013    Procedure: COLONOSCOPY;  Surgeon: Garlan Fair, MD;  Location: [facility];  Service: Endoscopy;  Laterality: N/A;  . Knee arthroscopy Bilateral RIGHT 2002 & 1994/   LEFT  1995  . 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 Rotator cuff repair Right 08-31-2008  . Laparoscopy assisted  right colectomy  03-22-2005    CECUM POLYP  . Abdominal hysterectomy  1995    W/ UNILATERAL SALPINGOOPHORECTOMY  . Dorsal compartment release Left 09/24/2013    Procedure: LEFT WRIST FIRST DORSAL COMPARTMENT TENOSYNOVITIS;  Surgeon: Linna Hoff, MD;  Location: [facility];  Service: Orthopedics;  Laterality: Left;  ANESTHESIA: LOCAL/IV SEDATION    No prescriptions prior to admission   No Known Allergies  History  Substance Use Topics  . Smoking status: Former Smoker -- 2 years    Types: Cigarettes    Quit date: 08/21/1959  . Smokeless tobacco: Never Used  . Alcohol Use: No    No family history on file.   Review of Systems  Constitutional: Negative.   HENT: Negative.   Eyes: Negative.   Respiratory: Negative.   Cardiovascular: Negative.   Gastrointestinal: Negative.   Genitourinary: Negative.   Musculoskeletal: Positive for joint pain.  Skin: Negative.   Neurological: Negative.   Endo/Heme/Allergies: Negative.   Psychiatric/Behavioral: Negative.     Objective:  Physical Exam  Constitutional: She is oriented to person,  place, and time. She appears well-developed.  HENT:  Head: Normocephalic.  Eyes: EOM are normal.  Neck: Normal range of motion.  Cardiovascular: Normal rate, regular rhythm, normal heart sounds and intact distal pulses.   Respiratory: Effort normal and breath sounds normal.  GI: Soft. Bowel sounds are normal.  Genitourinary:  Deferred  Musculoskeletal: She exhibits edema (Left knee) and tenderness (Left knee).  Neurological: She is alert and oriented to person,  place, and time. She has normal reflexes.  Skin: Skin is warm and dry.  Psychiatric: Her behavior is normal.    Vital signs in last 24 hours:  BP: 138/78  Labs:   Estimated body mass index is 24.87 kg/(m^2) as calculated from the following:   Height as of 03/29/14: 5\' 2"  (1.575 m).   Weight as of 03/29/14: 61.689 kg (136 lb).   Imaging Review Plain radiographs demonstrate moderate degenerative joint disease of the left knee(s). The overall alignment ismild varus. The bone quality appears to be good for age and reported activity level.  Assessment/Plan:  End stage arthritis, left knee   The patient history, physical examination, clinical judgment of the provider and imaging studies are consistent with end stage degenerative joint disease of the left knee(s) and total knee arthroplasty is deemed medically necessary. The treatment options including medical management, injection therapy arthroscopy and arthroplasty were discussed at length. The risks and benefits of total knee arthroplasty were presented and reviewed. The risks due to aseptic loosening, infection, stiffness, patella tracking problems, thromboembolic complications and other imponderables were discussed. The patient acknowledged the explanation, agreed to proceed with the plan and consent was signed. Patient is being admitted for inpatient treatment for surgery, pain control, PT, OT, prophylactic antibiotics, VTE prophylaxis, progressive ambulation and ADL's and discharge planning. The patient is planning to be discharged home with home health services

## 2014-08-23 ENCOUNTER — Encounter (HOSPITAL_COMMUNITY)
Admission: RE | Admit: 2014-08-23 | Discharge: 2014-08-23 | Disposition: A | Discharge status: Home / Self Care | Payer: 59 | Source: Ambulatory Visit | Attending: Specialist | Admitting: Specialist

## 2014-08-23 ENCOUNTER — Ambulatory Visit (HOSPITAL_COMMUNITY)
Admission: RE | Admit: 2014-08-23 | Discharge: 2014-08-23 | Disposition: A | Discharge status: Home / Self Care | Payer: 59 | Source: Ambulatory Visit | Attending: Specialist | Admitting: Specialist

## 2014-08-23 ENCOUNTER — Encounter (HOSPITAL_COMMUNITY): Payer: Self-pay

## 2014-08-23 DIAGNOSIS — E119 Type 2 diabetes mellitus without complications: Secondary | ICD-10-CM | POA: Diagnosis not present

## 2014-08-23 DIAGNOSIS — I10 Essential (primary) hypertension: Secondary | ICD-10-CM | POA: Insufficient documentation

## 2014-08-23 DIAGNOSIS — M40294 Other kyphosis, thoracic region: Secondary | ICD-10-CM | POA: Insufficient documentation

## 2014-08-23 DIAGNOSIS — I771 Stricture of artery: Secondary | ICD-10-CM | POA: Insufficient documentation

## 2014-08-23 DIAGNOSIS — I712 Thoracic aortic aneurysm, without rupture: Secondary | ICD-10-CM | POA: Insufficient documentation

## 2014-08-23 DIAGNOSIS — Z01818 Encounter for other preprocedural examination: Secondary | ICD-10-CM

## 2014-08-23 HISTORY — DX: Thoracic aortic aneurysm, without rupture, unspecified: I71.20

## 2014-08-23 HISTORY — DX: Cardiac murmur, unspecified: R01.1

## 2014-08-23 HISTORY — DX: Thoracic aortic aneurysm, without rupture: I71.2

## 2014-08-23 LAB — URINALYSIS, ROUTINE W REFLEX MICROSCOPIC
Bilirubin Urine: NEGATIVE
Glucose, UA: NEGATIVE mg/dL
Hgb urine dipstick: NEGATIVE
Ketones, ur: NEGATIVE mg/dL
Leukocytes, UA: NEGATIVE
Nitrite: NEGATIVE
Protein, ur: NEGATIVE mg/dL
Specific Gravity, Urine: 1.006 (ref 1.005–1.030)
Urobilinogen, UA: 0.2 mg/dL (ref 0.0–1.0)
pH: 7 (ref 5.0–8.0)

## 2014-08-23 LAB — CBC
HCT: 35.5 % — ABNORMAL LOW (ref 36.0–46.0)
Hemoglobin: 11.2 g/dL — ABNORMAL LOW (ref 12.0–15.0)
MCH: 26 pg (ref 26.0–34.0)
MCHC: 31.5 g/dL (ref 30.0–36.0)
MCV: 82.4 fL (ref 78.0–100.0)
Platelets: 232 K/uL (ref 150–400)
RBC: 4.31 MIL/uL (ref 3.87–5.11)
RDW: 14.8 % (ref 11.5–15.5)
WBC: 3.8 K/uL — ABNORMAL LOW (ref 4.0–10.5)

## 2014-08-23 LAB — BASIC METABOLIC PANEL WITH GFR
Anion gap: 7 (ref 5–15)
BUN: 15 mg/dL (ref 6–23)
CO2: 26 mmol/L (ref 19–32)
Calcium: 9.9 mg/dL (ref 8.4–10.5)
Chloride: 107 meq/L (ref 96–112)
Creatinine, Ser: 0.68 mg/dL (ref 0.50–1.10)
GFR calc Af Amer: >90 mL/min
GFR calc non Af Amer: 85 mL/min — ABNORMAL LOW
Glucose, Bld: 93 mg/dL (ref 70–99)
Potassium: 3.7 mmol/L (ref 3.5–5.1)
Sodium: 140 mmol/L (ref 135–145)

## 2014-08-23 LAB — PROTIME-INR
INR: 1.04 (ref 0.00–1.49)
Prothrombin Time: 13.7 seconds (ref 11.6–15.2)

## 2014-08-23 LAB — SURGICAL PCR SCREEN
MRSA, PCR: NEGATIVE
STAPHYLOCOCCUS AUREUS: NEGATIVE

## 2014-08-23 LAB — APTT: aPTT: 39 s — ABNORMAL HIGH (ref 24–37)

## 2014-08-23 NOTE — Progress Notes (Signed)
Clearance- Dr  Lavone Orn 06/16/2014 on chart  LOV with Dr Dahlia Byes- 02/2013 who follows thoracic aneurysm

## 2014-08-23 NOTE — Patient Instructions (Addendum)
Savannah Lewis  08/23/2014   Your procedure is scheduled on: 08/31/2014    Report to [facility]  Entrance and follow signs to               [facility] at       [place] AM.  Call this number if you have problems the morning of surgery [phone]   Remember: Eat a good healthy snack prior to bedtime   Do not eat food or drink liquids :After Midnight.     Take these medicines the morning of surgery with A SIP OF WATER: Amlodipine ( Norvasc), Zyrtec                                You may not have any metal on your body including hair pins and              piercings  Do not wear jewelry, make-up, lotions, powders or perfumes.             Do not wear nail polish.  Do not shave  48 hours prior to surgery.              Do not bring valuables to the hospital. [place].  Contacts, dentures or bridgework may not be worn into surgery.  Leave suitcase in the car. After surgery it may be brought to your room.      Special Instructions:coughing and deep breathing exercises, leg exercises               Please read over the following fact sheets you were given: _____________________________________________________________________             [facility] - Preparing for Surgery Before surgery, you can play an important role.  Because skin is not sterile, your skin needs to be as free of germs as possible.  You can reduce the number of germs on your skin by washing with CHG (chlorahexidine gluconate) soap before surgery.  CHG is an antiseptic cleaner which kills germs and bonds with the skin to continue killing germs even after washing. Please DO NOT use if you have an allergy to CHG or antibacterial soaps.  If your skin becomes reddened/irritated stop using the CHG and inform your nurse when you arrive at Short Stay. Do not shave (including legs and underarms) for at least 48 hours prior to the first CHG shower.  You may  shave your face/neck. Please follow these instructions carefully:  1.  Shower with CHG Soap the night before surgery and the  morning of Surgery.  2.  If you choose to wash your hair, wash your hair first as usual with your  normal  shampoo.  3.  After you shampoo, rinse your hair and body thoroughly to remove the  shampoo.                           4.  Use CHG as you would any other liquid soap.  You can apply chg directly  to the skin and wash                       Gently with a scrungie or  clean washcloth.  5.  Apply the CHG Soap to your body ONLY FROM THE NECK DOWN.   Do not use on face/ open                           Wound or open sores. Avoid contact with eyes, ears mouth and genitals (private parts).                       Wash face,  Genitals (private parts) with your normal soap.             6.  Wash thoroughly, paying special attention to the area where your surgery  will be performed.  7.  Thoroughly rinse your body with warm water from the neck down.  8.  DO NOT shower/wash with your normal soap after using and rinsing off  the CHG Soap.                9.  Pat yourself dry with a clean towel.            10.  Wear clean pajamas.            11.  Place clean sheets on your bed the night of your first shower and do not  sleep with pets. Day of Surgery : Do not apply any lotions/deodorants the morning of surgery.  Please wear clean clothes to the hospital/surgery center.  FAILURE TO FOLLOW THESE INSTRUCTIONS MAY RESULT IN THE CANCELLATION OF YOUR SURGERY PATIENT SIGNATURE_________________________________  NURSE SIGNATURE__________________________________  ________________________________________________________________________  WHAT IS A BLOOD TRANSFUSION? Blood Transfusion Information  A transfusion is the replacement of blood or some of its parts. Blood is made up of multiple cells which provide different functions.  Red blood cells carry oxygen and are used for blood loss  replacement.  White blood cells fight against infection.  Platelets control bleeding.  Plasma helps clot blood.  Other blood products are available for specialized needs, such as hemophilia or other clotting disorders. BEFORE THE TRANSFUSION  Who gives blood for transfusions?   Healthy volunteers who are fully evaluated to make sure their blood is safe. This is blood bank blood. Transfusion therapy is the safest it has ever been in the practice of medicine. Before blood is taken from a donor, a complete history is taken to make sure that person has no history of diseases nor engages in risky social behavior (examples are intravenous drug use or sexual activity with multiple partners). The donor's travel history is screened to minimize risk of transmitting infections, such as malaria. The donated blood is tested for signs of infectious diseases, such as HIV and hepatitis. The blood is then tested to be sure it is compatible with you in order to minimize the chance of a transfusion reaction. If you or a relative donates blood, this is often done in anticipation of surgery and is not appropriate for emergency situations. It takes many days to process the donated blood. RISKS AND COMPLICATIONS Although transfusion therapy is very safe and saves many lives, the main dangers of transfusion include:  1. Getting an infectious disease. 2. Developing a transfusion reaction. This is an allergic reaction to something in the blood you were given. Every precaution is taken to prevent this. The decision to have a blood transfusion has been considered carefully by your caregiver before blood is given. Blood is not given unless the benefits outweigh the risks. AFTER THE TRANSFUSION  Right after receiving a blood transfusion, you will usually feel much better and more energetic. This is especially true if your red blood cells have gotten low (anemic). The transfusion raises the level of the red blood cells which  carry oxygen, and this usually causes an energy increase.  The nurse administering the transfusion will monitor you carefully for complications. HOME CARE INSTRUCTIONS  No special instructions are needed after a transfusion. You may find your energy is better. Speak with your caregiver about any limitations on activity for underlying diseases you may have. SEEK MEDICAL CARE IF:   Your condition is not improving after your transfusion.  You develop redness or irritation at the intravenous (IV) site. SEEK IMMEDIATE MEDICAL CARE IF:  Any of the following symptoms occur over the next 12 hours:  Shaking chills.  You have a temperature by mouth above 102 F (38.9 C), not controlled by medicine.  Chest, back, or muscle pain.  People around you feel you are not acting correctly or are confused.  Shortness of breath or difficulty breathing.  Dizziness and fainting.  You get a rash or develop hives.  You have a decrease in urine output.  Your urine turns a dark color or changes to pink, red, or brown. Any of the following symptoms occur over the next 10 days:  You have a temperature by mouth above 102 F (38.9 C), not controlled by medicine.  Shortness of breath.  Weakness after normal activity.  The white part of the eye turns yellow (jaundice).  You have a decrease in the amount of urine or are urinating less often.  Your urine turns a dark color or changes to pink, red, or brown. Document Released: 08/03/2000 Document Revised: 10/29/2011 Document Reviewed: 03/22/2008 ExitCare Patient Information 2014 [place].  _______________________________________________________________________  Incentive Spirometer  An incentive spirometer is a tool that can help keep your lungs clear and active. This tool measures how well you are filling your lungs with each breath. Taking long deep breaths may help reverse or decrease the chance of developing breathing (pulmonary) problems  (especially infection) following:  A long period of time when you are unable to move or be active. BEFORE THE PROCEDURE   If the spirometer includes an indicator to show your best effort, your nurse or respiratory therapist will set it to a desired goal.  If possible, sit up straight or lean slightly forward. Try not to slouch.  Hold the incentive spirometer in an upright position. INSTRUCTIONS FOR USE  3. Sit on the edge of your bed if possible, or sit up as far as you can in bed or on a chair. 4. Hold the incentive spirometer in an upright position. 5. Breathe out normally. 6. Place the mouthpiece in your mouth and seal your lips tightly around it. 7. Breathe in slowly and as deeply as possible, raising the piston or the ball toward the top of the column. 8. Hold your breath for 3-5 seconds or for as long as possible. Allow the piston or ball to fall to the bottom of the column. 9. Remove the mouthpiece from your mouth and breathe out normally. 10. Rest for a few seconds and repeat Steps 1 through 7 at least 10 times every 1-2 hours when you are awake. Take your time and take a few normal breaths between deep breaths. 11. The spirometer may include an indicator to show your best effort. Use the indicator as a goal to work toward during each repetition. 12. After  each set of 10 deep breaths, practice coughing to be sure your lungs are clear. If you have an incision (the cut made at the time of surgery), support your incision when coughing by placing a pillow or rolled up towels firmly against it. Once you are able to get out of bed, walk around indoors and cough well. You may stop using the incentive spirometer when instructed by your caregiver.  RISKS AND COMPLICATIONS  Take your time so you do not get dizzy or light-headed.  If you are in pain, you may need to take or ask for pain medication before doing incentive spirometry. It is harder to take a deep breath if you are having  pain. AFTER USE  Rest and breathe slowly and easily.  It can be helpful to keep track of a log of your progress. Your caregiver can provide you with a simple table to help with this. If you are using the spirometer at home, follow these instructions: [place] IF:   You are having difficultly using the spirometer.  You have trouble using the spirometer as often as instructed.  Your pain medication is not giving enough relief while using the spirometer.  You develop fever of 100.5 F (38.1 C) or higher. SEEK IMMEDIATE MEDICAL CARE IF:   You cough up bloody sputum that had not been present before.  You develop fever of 102 F (38.9 C) or greater.  You develop worsening pain at or near the incision site. MAKE SURE YOU:   Understand these instructions.  Will watch your condition.  Will get help right away if you are not doing well or get worse. Document Released: 12/17/2006 Document Revised: 10/29/2011 Document Reviewed: 02/17/2007 [facility] Patient Information 2014 [place], [place].   ________________________________________________________________________

## 2014-08-23 NOTE — Progress Notes (Signed)
EKG- 09/27/2013 in [facility]

## 2014-08-31 ENCOUNTER — Encounter (HOSPITAL_COMMUNITY)
Admission: RE | Disposition: A | Discharge status: Home Health | Payer: Medicare Other | Source: Ambulatory Visit | Attending: Specialist

## 2014-08-31 ENCOUNTER — Inpatient Hospital Stay (HOSPITAL_COMMUNITY)
Admission: RE | Admit: 2014-08-31 | Discharge: 2014-09-03 | DRG: 470 | Disposition: A | Discharge status: Home Health | Payer: 59 | Source: Ambulatory Visit | Attending: Specialist | Admitting: Specialist

## 2014-08-31 ENCOUNTER — Inpatient Hospital Stay (HOSPITAL_COMMUNITY): Payer: 59 | Admitting: Certified Registered Nurse Anesthetist

## 2014-08-31 ENCOUNTER — Encounter (HOSPITAL_COMMUNITY): Payer: Self-pay | Admitting: Certified Registered Nurse Anesthetist

## 2014-08-31 DIAGNOSIS — M179 Osteoarthritis of knee, unspecified: Secondary | ICD-10-CM | POA: Diagnosis present

## 2014-08-31 DIAGNOSIS — E11319 Type 2 diabetes mellitus with unspecified diabetic retinopathy without macular edema: Secondary | ICD-10-CM | POA: Diagnosis present

## 2014-08-31 DIAGNOSIS — D62 Acute posthemorrhagic anemia: Secondary | ICD-10-CM | POA: Diagnosis not present

## 2014-08-31 DIAGNOSIS — Z96652 Presence of left artificial knee joint: Secondary | ICD-10-CM

## 2014-08-31 DIAGNOSIS — Z96659 Presence of unspecified artificial knee joint: Secondary | ICD-10-CM

## 2014-08-31 DIAGNOSIS — I10 Essential (primary) hypertension: Secondary | ICD-10-CM | POA: Diagnosis present

## 2014-08-31 DIAGNOSIS — Z87891 Personal history of nicotine dependence: Secondary | ICD-10-CM

## 2014-08-31 DIAGNOSIS — M25562 Pain in left knee: Secondary | ICD-10-CM | POA: Diagnosis present

## 2014-08-31 HISTORY — PX: TOTAL KNEE ARTHROPLASTY: SHX125

## 2014-08-31 LAB — TYPE AND SCREEN
ABO/RH(D): A POS
ANTIBODY SCREEN: NEGATIVE

## 2014-08-31 LAB — GLUCOSE, CAPILLARY
GLUCOSE-CAPILLARY: 101 mg/dL — AB (ref 70–99)
GLUCOSE-CAPILLARY: 104 mg/dL — AB (ref 70–99)

## 2014-08-31 SURGERY — ARTHROPLASTY, KNEE, TOTAL
Anesthesia: Monitor Anesthesia Care | Site: Knee | Laterality: Left

## 2014-08-31 MED ORDER — METOCLOPRAMIDE HCL 5 MG/ML IJ SOLN: 5.0000 mg | Freq: Three times a day (TID) | INTRAMUSCULAR | Status: DC | PRN

## 2014-08-31 MED ORDER — ALUM & MAG HYDROXIDE-SIMETH 200-200-20 MG/5ML PO SUSP: 30.0000 mL | ORAL | Status: DC | PRN

## 2014-08-31 MED ORDER — LIDOCAINE HCL (CARDIAC) 20 MG/ML IV SOLN
INTRAVENOUS | Status: DC | PRN
  Administered 2014-08-31: 50 mg via INTRAVENOUS

## 2014-08-31 MED ORDER — LACTATED RINGERS IV SOLN: INTRAVENOUS | Status: DC

## 2014-08-31 MED ORDER — DEXAMETHASONE SODIUM PHOSPHATE 10 MG/ML IJ SOLN
10.0000 mg | Freq: Once | INTRAMUSCULAR | Status: DC
  Filled 2014-08-31: qty 1

## 2014-08-31 MED ORDER — SODIUM CHLORIDE 0.9 % IV SOLN: INTRAVENOUS | Status: DC

## 2014-08-31 MED ORDER — AMLODIPINE BESYLATE 10 MG PO TABS
10.0000 mg | ORAL_TABLET | Freq: Every morning | ORAL | Status: DC
  Administered 2014-09-02: 10 mg via ORAL
  Filled 2014-08-31 (×4): qty 1

## 2014-08-31 MED ORDER — ONDANSETRON HCL 4 MG PO TABS: 4.0000 mg | ORAL_TABLET | Freq: Four times a day (QID) | ORAL | Status: DC | PRN

## 2014-08-31 MED ORDER — LOSARTAN POTASSIUM-HCTZ 100-25 MG PO TABS: 1.0000 | ORAL_TABLET | Freq: Every day | ORAL | Status: DC

## 2014-08-31 MED ORDER — BISACODYL 5 MG PO TBEC: 5.0000 mg | DELAYED_RELEASE_TABLET | Freq: Every day | ORAL | Status: DC | PRN

## 2014-08-31 MED ORDER — CEFAZOLIN SODIUM-DEXTROSE 2-3 GM-% IV SOLR
2.0000 g | Freq: Four times a day (QID) | INTRAVENOUS | Status: AC
  Administered 2014-08-31 – 2014-09-01 (×2): 2 g via INTRAVENOUS
  Filled 2014-08-31 (×2): qty 50

## 2014-08-31 MED ORDER — HYDROMORPHONE HCL 1 MG/ML IJ SOLN: 0.2500 mg | INTRAMUSCULAR | Status: DC | PRN

## 2014-08-31 MED ORDER — METFORMIN HCL 500 MG PO TABS
1000.0000 mg | ORAL_TABLET | Freq: Two times a day (BID) | ORAL | Status: DC
  Administered 2014-09-01 – 2014-09-03 (×5): 1000 mg via ORAL
  Filled 2014-08-31 (×9): qty 2

## 2014-08-31 MED ORDER — LORATADINE 10 MG PO TABS
10.0000 mg | ORAL_TABLET | Freq: Every day | ORAL | Status: DC
  Administered 2014-09-02 – 2014-09-03 (×2): 10 mg via ORAL
  Filled 2014-08-31 (×3): qty 1

## 2014-08-31 MED ORDER — FERROUS SULFATE 325 (65 FE) MG PO TABS
325.0000 mg | ORAL_TABLET | Freq: Three times a day (TID) | ORAL | Status: DC
  Administered 2014-09-01 – 2014-09-03 (×7): 325 mg via ORAL
  Filled 2014-08-31 (×10): qty 1

## 2014-08-31 MED ORDER — METHOCARBAMOL 500 MG PO TABS
500.0000 mg | ORAL_TABLET | Freq: Four times a day (QID) | ORAL | Status: DC | PRN
  Administered 2014-09-01 – 2014-09-03 (×6): 500 mg via ORAL
  Filled 2014-08-31 (×6): qty 1

## 2014-08-31 MED ORDER — SODIUM CHLORIDE 0.9 % IR SOLN
Status: DC | PRN
  Administered 2014-08-31: 1000 mL

## 2014-08-31 MED ORDER — MIDAZOLAM HCL 2 MG/2ML IJ SOLN
INTRAMUSCULAR | Status: AC
  Filled 2014-08-31: qty 2

## 2014-08-31 MED ORDER — KETOROLAC TROMETHAMINE 30 MG/ML IJ SOLN
INTRAMUSCULAR | Status: DC | PRN
  Administered 2014-08-31: 30 mg

## 2014-08-31 MED ORDER — SODIUM CHLORIDE 0.9 % IV SOLN
INTRAVENOUS | Status: DC
  Administered 2014-08-31: 18:00:00 via INTRAVENOUS

## 2014-08-31 MED ORDER — OXYCODONE HCL 5 MG PO TABS
5.0000 mg | ORAL_TABLET | ORAL | Status: DC | PRN
  Administered 2014-08-31: 5 mg via ORAL
  Administered 2014-08-31: 10 mg via ORAL
  Administered 2014-09-01 (×3): 5 mg via ORAL
  Administered 2014-09-01 – 2014-09-02 (×3): 10 mg via ORAL
  Administered 2014-09-02: 5 mg via ORAL
  Administered 2014-09-02 – 2014-09-03 (×5): 10 mg via ORAL
  Administered 2014-09-03: 5 mg via ORAL
  Filled 2014-08-31 (×2): qty 2
  Filled 2014-08-31: qty 1
  Filled 2014-08-31 (×4): qty 2
  Filled 2014-08-31: qty 1
  Filled 2014-08-31: qty 2
  Filled 2014-08-31: qty 1
  Filled 2014-08-31: qty 2
  Filled 2014-08-31: qty 1
  Filled 2014-08-31 (×3): qty 2

## 2014-08-31 MED ORDER — ZOLPIDEM TARTRATE 5 MG PO TABS: 5.0000 mg | ORAL_TABLET | Freq: Every evening | ORAL | Status: DC | PRN

## 2014-08-31 MED ORDER — ADULT MULTIVITAMIN W/MINERALS CH
1.0000 | ORAL_TABLET | Freq: Every day | ORAL | Status: DC
  Administered 2014-09-01 – 2014-09-03 (×3): 1 via ORAL
  Filled 2014-08-31 (×4): qty 1

## 2014-08-31 MED ORDER — DEXTROSE 5 % IV SOLN
500.0000 mg | Freq: Four times a day (QID) | INTRAVENOUS | Status: DC | PRN
  Filled 2014-08-31: qty 5

## 2014-08-31 MED ORDER — KETOROLAC TROMETHAMINE 30 MG/ML IJ SOLN
INTRAMUSCULAR | Status: AC
  Filled 2014-08-31: qty 1

## 2014-08-31 MED ORDER — FENTANYL CITRATE 0.05 MG/ML IJ SOLN
INTRAMUSCULAR | Status: AC
  Filled 2014-08-31: qty 2

## 2014-08-31 MED ORDER — CEFAZOLIN SODIUM-DEXTROSE 2-3 GM-% IV SOLR
2.0000 g | INTRAVENOUS | Status: AC
  Administered 2014-08-31: 2 g via INTRAVENOUS

## 2014-08-31 MED ORDER — LOSARTAN POTASSIUM 50 MG PO TABS
100.0000 mg | ORAL_TABLET | Freq: Every day | ORAL | Status: DC
  Administered 2014-09-02: 100 mg via ORAL
  Filled 2014-08-31 (×4): qty 2

## 2014-08-31 MED ORDER — FLEET ENEMA 7-19 GM/118ML RE ENEM: 1.0000 | ENEMA | Freq: Once | RECTAL | Status: AC | PRN

## 2014-08-31 MED ORDER — SODIUM CHLORIDE 0.9 % IV SOLN
1000.0000 mg | INTRAVENOUS | Status: AC
  Administered 2014-08-31: 1000 mg via INTRAVENOUS
  Filled 2014-08-31: qty 10

## 2014-08-31 MED ORDER — SODIUM CHLORIDE FLUSH 0.9 % IV SOLN
INTRAVENOUS | Status: DC | PRN
  Administered 2014-08-31: 30 mL

## 2014-08-31 MED ORDER — ACETAMINOPHEN 650 MG RE SUPP: 650.0000 mg | Freq: Four times a day (QID) | RECTAL | Status: DC | PRN

## 2014-08-31 MED ORDER — DIPHENHYDRAMINE HCL 12.5 MG/5ML PO ELIX: 12.5000 mg | ORAL_SOLUTION | ORAL | Status: DC | PRN

## 2014-08-31 MED ORDER — ENOXAPARIN SODIUM 30 MG/0.3ML ~~LOC~~ SOLN
30.0000 mg | Freq: Two times a day (BID) | SUBCUTANEOUS | Status: DC
  Administered 2014-09-01 – 2014-09-03 (×5): 30 mg via SUBCUTANEOUS
  Filled 2014-08-31 (×8): qty 0.3

## 2014-08-31 MED ORDER — PHENOL 1.4 % MT LIQD: 1.0000 | OROMUCOSAL | Status: DC | PRN

## 2014-08-31 MED ORDER — ONDANSETRON HCL 4 MG/2ML IJ SOLN
4.0000 mg | Freq: Four times a day (QID) | INTRAMUSCULAR | Status: DC | PRN
Start: 2014-08-31 — End: 2014-09-03

## 2014-08-31 MED ORDER — BUPIVACAINE IN DEXTROSE 0.75-8.25 % IT SOLN
INTRATHECAL | Status: DC | PRN
  Administered 2014-08-31: 1.6 mL via INTRATHECAL

## 2014-08-31 MED ORDER — ACETAMINOPHEN 325 MG PO TABS
650.0000 mg | ORAL_TABLET | Freq: Four times a day (QID) | ORAL | Status: DC | PRN
  Administered 2014-09-02: 650 mg via ORAL
  Filled 2014-08-31: qty 2

## 2014-08-31 MED ORDER — PHENYLEPHRINE HCL 10 MG/ML IJ SOLN
10.0000 mg | INTRAVENOUS | Status: DC | PRN
  Administered 2014-08-31: 40 ug/min via INTRAVENOUS

## 2014-08-31 MED ORDER — ACETAMINOPHEN 10 MG/ML IV SOLN
INTRAVENOUS | Status: DC | PRN
  Administered 2014-08-31: 1000 mg via INTRAVENOUS

## 2014-08-31 MED ORDER — POTASSIUM CHLORIDE ER 10 MEQ PO TBCR
10.0000 meq | EXTENDED_RELEASE_TABLET | Freq: Two times a day (BID) | ORAL | Status: DC
  Administered 2014-08-31 – 2014-09-03 (×5): 10 meq via ORAL
  Filled 2014-08-31 (×9): qty 1

## 2014-08-31 MED ORDER — POVIDONE-IODINE 7.5 % EX SOLN: Freq: Once | CUTANEOUS | Status: DC

## 2014-08-31 MED ORDER — MIDAZOLAM HCL 5 MG/5ML IJ SOLN
INTRAMUSCULAR | Status: DC | PRN
  Administered 2014-08-31: 1 mg via INTRAVENOUS

## 2014-08-31 MED ORDER — HYDROCHLOROTHIAZIDE 25 MG PO TABS
25.0000 mg | ORAL_TABLET | Freq: Every day | ORAL | Status: DC
  Administered 2014-09-02: 25 mg via ORAL
  Filled 2014-08-31 (×4): qty 1

## 2014-08-31 MED ORDER — DOCUSATE SODIUM 100 MG PO CAPS
100.0000 mg | ORAL_CAPSULE | Freq: Two times a day (BID) | ORAL | Status: DC
  Administered 2014-08-31 – 2014-09-03 (×6): 100 mg via ORAL

## 2014-08-31 MED ORDER — DEXAMETHASONE SODIUM PHOSPHATE 10 MG/ML IJ SOLN
10.0000 mg | Freq: Once | INTRAMUSCULAR | Status: AC
  Administered 2014-08-31: 10 mg via INTRAVENOUS

## 2014-08-31 MED ORDER — FENTANYL CITRATE 0.05 MG/ML IJ SOLN
INTRAMUSCULAR | Status: DC | PRN
  Administered 2014-08-31: 50 ug via INTRAVENOUS

## 2014-08-31 MED ORDER — MENTHOL 3 MG MT LOZG
1.0000 | LOZENGE | OROMUCOSAL | Status: DC | PRN
  Filled 2014-08-31: qty 9

## 2014-08-31 MED ORDER — METOCLOPRAMIDE HCL 10 MG PO TABS: 5.0000 mg | ORAL_TABLET | Freq: Three times a day (TID) | ORAL | Status: DC | PRN

## 2014-08-31 MED ORDER — POLYETHYLENE GLYCOL 3350 17 G PO PACK: 17.0000 g | PACK | Freq: Every day | ORAL | Status: DC | PRN

## 2014-08-31 MED ORDER — PROPOFOL INFUSION 10 MG/ML OPTIME
INTRAVENOUS | Status: DC | PRN
  Administered 2014-08-31: 75 ug/kg/min via INTRAVENOUS

## 2014-08-31 MED ORDER — BUPIVACAINE-EPINEPHRINE 0.25% -1:200000 IJ SOLN
INTRAMUSCULAR | Status: DC | PRN
  Administered 2014-08-31: 30 mL

## 2014-08-31 MED ORDER — HYDROMORPHONE HCL 1 MG/ML IJ SOLN: 0.5000 mg | INTRAMUSCULAR | Status: DC | PRN

## 2014-08-31 MED ORDER — LACTATED RINGERS IV SOLN
INTRAVENOUS | Status: DC
  Administered 2014-08-31: 14:00:00 via INTRAVENOUS
  Administered 2014-08-31: 1000 mL via INTRAVENOUS

## 2014-08-31 SURGICAL SUPPLY — 64 items
BAG ZIPLOCK 12X15 (MISCELLANEOUS) ×4 IMPLANT
BANDAGE ELASTIC 4 VELCRO ST LF (GAUZE/BANDAGES/DRESSINGS) ×2 IMPLANT
BANDAGE ELASTIC 6 VELCRO ST LF (GAUZE/BANDAGES/DRESSINGS) ×2 IMPLANT
BANDAGE ESMARK 6X9 LF (GAUZE/BANDAGES/DRESSINGS) ×1 IMPLANT
BLADE SAG 18X100X1.27 (BLADE) ×2 IMPLANT
BLADE SAW SGTL 13.0X1.19X90.0M (BLADE) ×2 IMPLANT
BNDG ESMARK 6X9 LF (GAUZE/BANDAGES/DRESSINGS) ×2
BONE CEMENT GENTAMICIN (Cement) ×4 IMPLANT
CAP KNEE TOTAL 3 SIGMA ×2 IMPLANT
CEMENT BONE GENTAMICIN 40 (Cement) ×2 IMPLANT
CUFF TOURN SGL QUICK 34 (TOURNIQUET CUFF) ×2
CUFF TRNQT CYL 34X4X40X1 (TOURNIQUET CUFF) ×1 IMPLANT
DRAPE EXTREMITY T 121X128X90 (DRAPE) ×2 IMPLANT
DRAPE POUCH INSTRU U-SHP 10X18 (DRAPES) ×2 IMPLANT
DRAPE SHEET LG 3/4 BI-LAMINATE (DRAPES) ×2 IMPLANT
DRAPE U-SHAPE 47X51 STRL (DRAPES) ×2 IMPLANT
DRSG AQUACEL AG ADV 3.5X10 (GAUZE/BANDAGES/DRESSINGS) ×2 IMPLANT
DRSG TEGADERM 4X4.75 (GAUZE/BANDAGES/DRESSINGS) ×2 IMPLANT
DURAPREP 26ML APPLICATOR (WOUND CARE) ×2 IMPLANT
ELECT REM PT RETURN 9FT ADLT (ELECTROSURGICAL) ×2
ELECTRODE REM PT RTRN 9FT ADLT (ELECTROSURGICAL) ×1 IMPLANT
EVACUATOR 1/8 PVC DRAIN (DRAIN) ×2 IMPLANT
FACESHIELD WRAPAROUND (MASK) ×10 IMPLANT
GAUZE SPONGE 2X2 8PLY STRL LF (GAUZE/BANDAGES/DRESSINGS) ×1 IMPLANT
GLOVE BIOGEL PI IND STRL 8 (GLOVE) ×2 IMPLANT
GLOVE BIOGEL PI INDICATOR 8 (GLOVE) ×2
GLOVE SURG ORTHO 8.0 STRL STRW (GLOVE) ×2 IMPLANT
GLOVE SURG ORTHO 9.0 STRL STRW (GLOVE) ×2 IMPLANT
GLOVE SURG SS PI 7.5 STRL IVOR (GLOVE) ×2 IMPLANT
GOWN STRL REUS W/TWL XL LVL3 (GOWN DISPOSABLE) ×4 IMPLANT
HANDPIECE INTERPULSE COAX TIP (DISPOSABLE) ×1
IMMOBILIZER KNEE 20 (SOFTGOODS) ×4 IMPLANT
IMMOBILIZER KNEE 20 THIGH 36 (SOFTGOODS) ×1 IMPLANT
KIT BASIN OR (CUSTOM PROCEDURE TRAY) ×2 IMPLANT
LIQUID BAND (GAUZE/BANDAGES/DRESSINGS) ×2 IMPLANT
NDL SAFETY ECLIPSE 18X1.5 (NEEDLE) ×1 IMPLANT
NEEDLE HYPO 18GX1.5 SHARP (NEEDLE) ×1
NS IRRIG 1000ML POUR BTL (IV SOLUTION) ×2 IMPLANT
PACK TOTAL JOINT (CUSTOM PROCEDURE TRAY) ×2 IMPLANT
POSITIONER SURGICAL ARM (MISCELLANEOUS) ×2 IMPLANT
SET HNDPC FAN SPRY TIP SCT (DISPOSABLE) ×1 IMPLANT
SET PAD KNEE POSITIONER (MISCELLANEOUS) ×2 IMPLANT
SPONGE GAUZE 2X2 8PLY STRL LF (GAUZE/BANDAGES/DRESSINGS) ×2 IMPLANT
SPONGE GAUZE 2X2 STER 10/PKG (GAUZE/BANDAGES/DRESSINGS) ×1
SPONGE LAP 18X18 X RAY DECT (DISPOSABLE) IMPLANT
SPONGE SURGIFOAM ABS GEL 100 (HEMOSTASIS) IMPLANT
STOCKINETTE 6  STRL (DRAPES) ×1
STOCKINETTE 6 STRL (DRAPES) ×1 IMPLANT
SUCTION FRAZIER 12FR DISP (SUCTIONS) ×2 IMPLANT
SUT BONE WAX W31G (SUTURE) IMPLANT
SUT MNCRL AB 3-0 PS2 18 (SUTURE) ×2 IMPLANT
SUT VIC AB 1 CT1 27 (SUTURE) ×8
SUT VIC AB 1 CT1 27XBRD ANTBC (SUTURE) ×4 IMPLANT
SUT VIC AB 2-0 CT1 27 (SUTURE) ×2
SUT VIC AB 2-0 CT1 TAPERPNT 27 (SUTURE) ×2 IMPLANT
SUT VLOC 180 0 24IN GS25 (SUTURE) ×2 IMPLANT
SYR 50ML LL SCALE MARK (SYRINGE) ×2 IMPLANT
TAPE STRIPS DRAPE STRL (GAUZE/BANDAGES/DRESSINGS) ×2 IMPLANT
TOWEL OR 17X26 10 PK STRL BLUE (TOWEL DISPOSABLE) ×2 IMPLANT
TOWEL OR NON WOVEN STRL DISP B (DISPOSABLE) IMPLANT
TOWER CARTRIDGE SMART MIX (DISPOSABLE) ×2 IMPLANT
TRAY FOLEY CATH 14FRSI W/METER (CATHETERS) ×2 IMPLANT
WATER STERILE IRR 1500ML POUR (IV SOLUTION) ×4 IMPLANT
WRAP KNEE MAXI GEL POST OP (GAUZE/BANDAGES/DRESSINGS) ×2 IMPLANT

## 2014-08-31 NOTE — Transfer of Care (Signed)
Immediate Anesthesia Transfer of Care Note  Patient: Savannah Lewis  Procedure(s) Performed: Procedure(s) (LRB): LEFT TOTAL KNEE ARTHROPLASTY (Left)  Patient Location: PACU  Anesthesia Type: Spinal  Level of Consciousness: sedated, patient cooperative and responds to stimulation  Airway & Oxygen Therapy: Patient Spontanous Breathing and Patient connected to face mask oxgen  Post-op Assessment: Report given to PACU RN and Post -op Vital signs reviewed and stable  Post vital signs: Reviewed and stable  Complications: No apparent anesthesia complications, S1 level on exam in PACU moved bilat lower ext. Denied pain on assessment.

## 2014-08-31 NOTE — Plan of Care (Signed)
Problem: Consults Goal: Diagnosis- Total Joint Replacement Left total knee     

## 2014-08-31 NOTE — Anesthesia Preprocedure Evaluation (Addendum)
Anesthesia Evaluation  Patient identified by MRN, date of birth, ID band Patient awake    Reviewed: Allergy & Precautions, H&P , NPO status , Patient's Chart, lab work & pertinent test results  Airway Mallampati: II  TM Distance: >3 FB Neck ROM: full    Dental no notable dental hx. (+) Teeth Intact, Dental Advisory Given   Pulmonary neg pulmonary ROS, former smoker,  breath sounds clear to auscultation  Pulmonary exam normal       Cardiovascular Exercise Tolerance: Good hypertension, Pt. 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    Neuro/Psych negative neurological ROS  negative psych ROS   GI/Hepatic negative GI ROS, Neg liver ROS,   Endo/Other  diabetes, Well Controlled, Type 2, Oral Hypoglycemic Agents  Renal/GU negative Renal ROS  negative genitourinary   Musculoskeletal   Abdominal   Peds  Hematology negative hematology ROS (+)   Anesthesia Other Findings   Reproductive/Obstetrics negative OB ROS                            Anesthesia Physical Anesthesia Plan  ASA: IV  Anesthesia Plan: Spinal and MAC   Post-op Pain Management:    Induction: Intravenous  Airway Management Planned: Simple Face Mask and Natural Airway  Additional Equipment:   Intra-op Plan:   Post-operative Plan:   Informed Consent: I have reviewed the patients History and Physical, chart, labs and discussed the procedure including the risks, benefits and alternatives for the proposed anesthesia with the patient or authorized representative who has indicated his/her understanding and acceptance.     Plan Discussed with: CRNA  Anesthesia Plan Comments:        Anesthesia Quick Evaluation

## 2014-08-31 NOTE — Anesthesia Postprocedure Evaluation (Signed)
  Anesthesia Post-op Note  Patient: Savannah Lewis  Procedure(s) Performed: Procedure(s) (LRB): LEFT TOTAL KNEE ARTHROPLASTY (Left)  Patient Location: PACU  Anesthesia Type: Spinal  Level of Consciousness: awake and alert   Airway and Oxygen Therapy: Patient Spontanous Breathing  Post-op Pain: mild  Post-op Assessment: Post-op Vital signs reviewed, Patient's Cardiovascular Status Stable, Respiratory Function Stable, Patent Airway and No signs of Nausea or vomiting  Last Vitals:  Filed Vitals:   08/31/14 1610  BP: 124/62  Pulse: 62  Temp: 36.7 C  Resp: 16    Post-op Vital Signs: stable   Complications: No apparent anesthesia complications

## 2014-08-31 NOTE — Op Note (Signed)
DATE OF SURGERY:  08/31/2014  TIME: 2:49 PM  PATIENT NAME:  Savannah Lewis    AGE: 74 y.o.   PRE-OPERATIVE DIAGNOSIS:  left knee osteoarthritis  POST-OPERATIVE DIAGNOSIS:  left knee osteoarthritis  PROCEDURE:  Procedure(s): LEFT TOTAL KNEE ARTHROPLASTY  SURGEON:  Eliora Nienhuis ANDREW  ASSISTANT:  Bryson Stilwell, PA-C, present and scrubbed throughout the case, critical for assistance with exposure, retraction, instrumentation, and closure.  OPERATIVE IMPLANTS: Depuy PFC Sigma Rotating Platform.  Femur size 2, Tibia size 2, Patella size 35 3-peg oval button, with a 10 mm polyethylene insert.   PREOPERATIVE INDICATIONS:   Savannah Lewis is a 74 y.o. year old female with end stage bone on bone arthritis of the knee who failed conservative treatment and elected for Total Knee Arthroplasty.   The risks, benefits, and alternatives were discussed at length including but not limited to the risks of infection, bleeding, nerve injury, stiffness, blood clots, the need for revision surgery, cardiopulmonary complications, among others, and they were willing to proceed.  OPERATIVE DESCRIPTION:  The patient was brought to the operative room and placed in a supine position.  Spinal anesthesia was administered.  IV antibiotics were given.  The lower extremity was prepped and draped in the usual sterile fashion.  Time out was performed.  The leg was elevated and exsanguinated and the tourniquet was inflated.  Anterior quadriceps tendon splitting approach was performed.  The patella was retracted and osteophytes were removed.  The anterior horn of the medial and lateral meniscus was removed and cruciate ligaments resected.   The distal femur was opened with the drill and the intramedullary distal femoral cutting jig was utilized, set at 5 degrees resecting 10 mm off the distal femur.  Care was taken to protect the collateral ligaments.  The distal femoral sizing jig was applied, taking care to  avoid notching.  Then the 4-in-1 cutting jig was applied and the anterior and posterior femur was cut, along with the chamfer cuts.    Then the extramedullary tibial cutting jig was utilized making the appropriate cut using the anterior tibial crest as a reference building in appropriate posterior slope.  Care was taken during the cut to protect the medial and collateral ligaments.  The proximal tibia was removed along with the posterior horns of the menisci.   The posterior medial femoral osteophytes and posterior lateral femoral osteophytes were removed.    The flexion gap was then measured and was symmetric with the extension gap, measured at 10.  I completed the distal femoral preparation using the appropriate jig to prepare the box.  The patella was then measured, and cut with the saw.    The proximal tibia sized and prepared accordingly with the reamer and the punch, and then all components were trialed with the trial insert.  The knee was found to have excellent balance and full motion.    The above named components were then cemented into place and all excess cement was removed.  The trial polyethylene component was in place during cementation, and then was exchanged for the real polyethylene component.    The knee was easily taken through a range of motion and the patella tracked well and the knee irrigated copiously and the parapatellar and subcutaneous tissue closed with vicryl, and monocryl with steri strips for the skin.  The arthrotomy was closed at 90 of flexion. The wounds were dressed with sterile gauze and the tourniquet released and the patient was awakened and returned to the PACU  in stable and satisfactory condition.  There were no complications.60cc 23f marcaine and epi,saline,and 30mg  toradol periosteal injection.Vlock capsule closure.  Total tourniquet time was 95 minutes.                DATE OF SURGERY:  08/31/2014  TIME: 2:49 PM  PATIENT NAME:  Savannah Lewis    AGE: 74 y.o.   PRE-OPERATIVE DIAGNOSIS:  left knee osteoarthritis  POST-OPERATIVE DIAGNOSIS:  left knee osteoarthritis  PROCEDURE:  Procedure(s): LEFT TOTAL KNEE ARTHROPLASTY  SURGEON:  Shawnita Krizek ANDREW  ASSISTANT:  Bryson Stilwell, PA-C, present and scrubbed throughout the case, critical for assistance with exposure, retraction, instrumentation, and closure.  OPERATIVE IMPLANTS: Depuy PFC Sigma Rotating Platform.  Femur size 2, Tibia size 2, Patella size 35  3-peg oval button, with a 10 mm polyethylene insert.   PREOPERATIVE INDICATIONS:   Savannah Lewis is a 74 y.o. year old female with end stage bone on bone arthritis of the knee who failed conservative treatment and elected for Total Knee Arthroplasty.   The risks, benefits, and alternatives were discussed at length including but not limited to the risks of infection, bleeding, nerve injury, stiffness, blood clots, the need for revision surgery, cardiopulmonary complications, among others, and they were willing to proceed.  OPERATIVE DESCRIPTION:  The patient was brought to the operative room and placed in a supine position.  Spinal anesthesia was administered.  IV antibiotics were given.  The lower extremity was prepped and draped in the usual sterile fashion.  Time out was performed.  The leg was elevated and exsanguinated and the tourniquet was inflated.  Anterior quadriceps tendon splitting approach was performed.  The patella was retracted and osteophytes were removed.  The anterior horn of the medial and lateral meniscus was removed and cruciate ligaments resected.   The distal femur was opened with the drill and the intramedullary distal femoral cutting jig was utilized, set at 5 degrees resecting 10 mm off the distal femur.  Care was taken to protect the collateral ligaments.  The distal femoral sizing jig was applied, taking care to avoid notching.  Then the 4-in-1 cutting jig was applied and the anterior  and posterior femur was cut, along with the chamfer cuts.    Then the extramedullary tibial cutting jig was utilized making the appropriate cut using the anterior tibial crest as a reference building in appropriate posterior slope.  Care was taken during the cut to protect the medial and collateral ligaments.  The proximal tibia was removed along with the posterior horns of the menisci.   The posterior medial femoral osteophytes and posterior lateral femoral osteophytes were removed.    The flexion gap was then measured and was symmetric with the extension gap, measured at 10.  I completed the distal femoral preparation using the appropriate jig to prepare the box.  The patella was then measured, and cut with the saw.    The proximal tibia sized and prepared accordingly with the reamer and the punch, and then all components were trialed with the trial insert.  The knee was found to have excellent balance and full motion.    The above named components were then cemented into place and all excess cement was removed.  The trial polyethylene component was in place during cementation, and then was exchanged for the real polyethylene component.    The knee was easily taken through a range of motion and the patella tracked well and the knee irrigated copiously and the parapatellar  and subcutaneous tissue closed with vicryl, and monocryl with steri strips for the skin.  The arthrotomy was closed at 90 of flexion. The wounds were dressed with sterile gauze and the tourniquet released and the patient was awakened and returned to the PACU in stable and satisfactory condition.  There were no complications.  Total tourniquet time was 90 minutes.

## 2014-08-31 NOTE — Anesthesia Procedure Notes (Signed)
Spinal Patient location during procedure: OR Start time: 08/31/2014 12:49 PM End time: 08/31/2014 12:54 PM Reason for block: at surgeon's request Staffing Resident/CRNA: Anne Fu Performed by: resident/CRNA  Preanesthetic Checklist Completed: patient identified, site marked, surgical consent, pre-op evaluation, timeout performed, IV checked, risks and benefits discussed, monitors and equipment checked and at surgeon's request Spinal Block Patient position: sitting Prep: ChloraPrep Approach: right paramedian Location: L2-3 Injection technique: single-shot Needle Needle type: Spinocan  Needle gauge: 22 G Needle length: 9 cm Assessment Sensory level: T6 Additional Notes Expiration date of kit checked and confirmed. Patient tolerated procedure well, without complications. X 1 attempt with noted clear CSF return. Loss of motor and sensory on exam post injection.

## 2014-08-31 NOTE — Interval H&P Note (Signed)
History and Physical Interval Note:  08/31/2014 12:35 PM  Savannah Lewis  has presented today for surgery, with the diagnosis of left knee osteoarthritis  The various methods of treatment have been discussed with the patient and family. After consideration of risks, benefits and other options for treatment, the patient has consented to  Procedure(s): LEFT TOTAL KNEE ARTHROPLASTY (Left) as a surgical intervention .  The patient's history has been reviewed, patient examined, no change in status, stable for surgery.  I have reviewed the patient's chart and labs.  Questions were answered to the patient's satisfaction.     Adriann Thau ANDREW

## 2014-09-01 ENCOUNTER — Encounter: Payer: Self-pay | Admitting: Internal Medicine

## 2014-09-01 ENCOUNTER — Encounter (HOSPITAL_COMMUNITY): Payer: Self-pay | Admitting: Specialist

## 2014-09-01 LAB — BASIC METABOLIC PANEL
Anion gap: 7 (ref 5–15)
BUN: 15 mg/dL (ref 6–23)
CALCIUM: 8.6 mg/dL (ref 8.4–10.5)
CO2: 27 mmol/L (ref 19–32)
CREATININE: 0.66 mg/dL (ref 0.50–1.10)
Chloride: 105 mEq/L (ref 96–112)
GFR calc Af Amer: >90 mL/min (ref >90)
GFR calc non Af Amer: 86 mL/min — ABNORMAL LOW (ref >90)
Glucose, Bld: 144 mg/dL — ABNORMAL HIGH (ref 70–99)
Potassium: 3.7 mmol/L (ref 3.5–5.1)
SODIUM: 139 mmol/L (ref 135–145)

## 2014-09-01 LAB — CBC
HCT: 29.4 % — ABNORMAL LOW (ref 36.0–46.0)
Hemoglobin: 9.4 g/dL — ABNORMAL LOW (ref 12.0–15.0)
MCH: 26.1 pg (ref 26.0–34.0)
MCHC: 32 g/dL (ref 30.0–36.0)
MCV: 81.7 fL (ref 78.0–100.0)
Platelets: 188 10*3/uL (ref 150–400)
RBC: 3.6 MIL/uL — AB (ref 3.87–5.11)
RDW: 14.6 % (ref 11.5–15.5)
WBC: 9.1 10*3/uL (ref 4.0–10.5)

## 2014-09-01 NOTE — Evaluation (Signed)
Physical Therapy Evaluation Patient Details Name: Savannah Lewis MRN: [id number] DOB: Dec 11, 1940 Today's Date: 09/01/2014   History of Present Illness  Pt is s/p LEFT TOTAL KNEE ARTHROPLASTY  Clinical Impression  Pt s/p L TKR presents with decreased L LE strength/ROM and post op pain limiting functional mobility.  Pt would benefit from follow up rehab at SNF level to maximize IND and safety prior to return home alone.    Follow Up Recommendations SNF    Equipment Recommendations  None recommended by PT    Recommendations for Other Services OT consult     Precautions / Restrictions Precautions Precautions: Knee Required Braces or Orthoses: Knee Immobilizer - Left Knee Immobilizer - Left: Discontinue once straight leg raise with < 10 degree lag Restrictions Weight Bearing Restrictions: No Other Position/Activity Restrictions: WBAT      Mobility  Bed Mobility Overal bed mobility: Needs Assistance Bed Mobility: Supine to Sit     Supine to sit: Min assist     General bed mobility comments: cues for sequence and use of UEs to self assist  Transfers Overall transfer level: Needs assistance Equipment used: Rolling walker (2 wheeled) Transfers: Sit to/from Stand Sit to Stand: Min guard         General transfer comment: min verbal cues for safety.  Ambulation/Gait Ambulation/Gait assistance: Min assist Ambulation Distance (Feet): 80 Feet (and 15' to bathroom) Assistive device: Rolling walker (2 wheeled) Gait Pattern/deviations: Step-to pattern;Decreased step length - left;Decreased step length - right;Shuffle;Trunk flexed Gait velocity: decr   General Gait Details: cues for posture, sequence and position from [employer] Mobility    Modified Rankin (Stroke Patients Only)       Balance                                             Pertinent Vitals/Pain Pain Assessment: 0-10 Pain Score: 1  Pain Location: L  knee Pain Descriptors / Indicators: Sore Pain Intervention(s): Ice applied;Repositioned    Home Living Family/patient expects to be discharged to:: Skilled nursing facility Living Arrangements: Alone                    Prior Function Level of Independence: Independent         Comments: pt states she worked up until a week ago and was independent with all ADL.     Hand Dominance        Extremity/Trunk Assessment   Upper Extremity Assessment: Overall WFL for tasks assessed           Lower Extremity Assessment: LLE deficits/detail   LLE Deficits / Details: 3-/5 quads with AAROM at knee -10-  80  Cervical / Trunk Assessment: Normal  Communication   Communication: No difficulties  Cognition Arousal/Alertness: Awake/alert Behavior During Therapy: WFL for tasks assessed/performed Overall Cognitive Status: Within Functional Limits for tasks assessed                      General Comments General comments (skin integrity, edema, etc.): min guard assist standing to pull up gown.    Exercises Total Joint Exercises Ankle Circles/Pumps: AROM;Both;15 reps;Supine Quad Sets: AROM;Both;10 reps;Supine Heel Slides: AAROM;Right;15 reps;Supine Straight Leg Raises: AAROM;AROM;Right;10 reps;Supine      Assessment/Plan    PT Assessment    PT  Diagnosis Difficulty walking   PT Problem List    PT Treatment Interventions     PT Goals (Current goals can be found in the Care Plan section) Acute Rehab PT Goals Patient Stated Goal: return to independence PT Goal Formulation: With patient Time For Goal Achievement: 09/08/14 Potential to Achieve Goals: Good    Frequency     Barriers to discharge        Co-evaluation               End of Session Equipment Utilized During Treatment: Gait belt Activity Tolerance: Patient tolerated treatment well Patient left: in chair;with call bell/phone within reach Nurse Communication: Mobility status          Time: 0254-2706 PT Time Calculation (min) (ACUTE ONLY): 30 min   Charges:   PT Evaluation $Initial PT Evaluation Tier I: 1 Procedure PT Treatments $Gait Training: 8-22 mins $Therapeutic Exercise: 8-22 mins   PT G Codes:        Taedyn Glasscock 09-25-2014, 12:22 PM

## 2014-09-01 NOTE — Progress Notes (Signed)
Clinical Social Work Department BRIEF PSYCHOSOCIAL ASSESSMENT 09/01/2014  Patient:  HANEEFAH, VENTURINI     Account Number:  [id number]     Admit date:  08/31/2014  Clinical Social Worker:  Lacie Scotts  Date/Time:  09/01/2014 02:55 PM  Referred by:  Physician  Date Referred:  09/01/2014 Referred for  SNF Placement   Other Referral:   Interview type:  Other - See comment Other interview type:    PSYCHOSOCIAL DATA Living Status:  ALONE Admitted from facility:   Level of care:   Primary support name:  Astrid Drafts Primary support relationship to patient:  CHILD, ADULT Degree of support available:   limited    CURRENT CONCERNS Current Concerns  Post-Acute Placement   Other Concerns:    SOCIAL WORK ASSESSMENT / PLAN Pt is a 74 yr old female living at home prior to hospitalization. CSW met with pt to assist with d/c planning. This is a planned admission. PN reviewed. PT recommends [facility] Re[facility] following hospital d/c. Pt agrees with this plan. SNF search initiated and bed offers provided. Pt has [employer]. CSW will assist with insurance authorization as needed.   Assessment/plan status:  Psychosocial Support/Ongoing Assessment of Needs Other assessment/ plan:   Information/referral to community resources:   insurance coverage for SNF and ambulance transport reviewed.    PATIENT'S/FAMILY'S RESPONSE TO PLAN OF CARE: Pt was hoping to have her rehab in [place] and is disappointed [facility] is not in network with insurance. She is willing to consider placement at [facility]. Pt is motivated to work with therapy.    Werner Lean LCSW [phone]

## 2014-09-01 NOTE — Care Management Note (Signed)
    Page 1 of 2   09/01/2014     4:46:41 PM CARE MANAGEMENT NOTE 09/01/2014  Patient:  Savannah Lewis, Savannah Lewis   Account Number:  [id number]  Date Initiated:  09/01/2014  Documentation initiated by:  [facility]  Subjective/Objective Assessment:   adm: LEFT TOTAL KNEE ARTHROPLASTY (Left)     Action/Plan:   discharge planning   Anticipated DC Date:  09/01/2014   Anticipated DC Plan:  [place]         [facility]  HOME HEALTH   Choice offered to / List presented to:  C-1 Patient   DME arranged  3-N-1  Vassie Moselle      DME agency  [place] arranged  [place].   Status of service:  Completed, signed off Medicare Important Message given?   (If response is "NO", the following Medicare IM given date fields will be blank) Date Medicare IM given:   Medicare IM given by:   Date Additional Medicare IM given:   Additional Medicare IM given by:    Discharge Disposition:  [place]  Per UR Regulation:  Reviewed for med. necessity/level of care/duration of stay  If discussed at [place] of Stay Meetings, dates discussed:    Comments:  09/01/14 16:15 CM met with pt in room as RN states she has decided to go home rather than SNF.  CM offered choice of home health agency.  Pt chooses [facility] to render HHPT. Address and contact information verified with pt.  CM called [facility] DME rep, Lecretia to please deliver 3n1 and rolling walker to room prior todischarge.  Referral called to [facility] rep, Lecretia.  CM called CSW to notify of pt's change of mind for disposition is now to go home with home health.  No other Cm needs were communicated. Mariane Masters, BSN, CM [phone].

## 2014-09-01 NOTE — Progress Notes (Signed)
Physical Therapy Treatment Patient Details Name: Savannah Lewis MRN: [id number] DOB: April 19, 1941 Today's Date: 09/01/2014    History of Present Illness Pt is s/p LEFT TOTAL KNEE ARTHROPLASTY    PT Comments    Pt progressing well and motivated to d.c to home vs SNF  Follow Up Recommendations  SNF     Equipment Recommendations  None recommended by PT    Recommendations for Other Services OT consult     Precautions / Restrictions Precautions Precautions: Knee Required Braces or Orthoses: Knee Immobilizer - Left Knee Immobilizer - Left: Discontinue once straight leg raise with < 10 degree lag (Pt performed IND SLR this date) Restrictions Weight Bearing Restrictions: No Other Position/Activity Restrictions: WBAT    Mobility  Bed Mobility Overal bed mobility: Needs Assistance Bed Mobility: Sit to Supine       Sit to supine: Min assist   General bed mobility comments: cues for sequence and min assist to manage L LE  Transfers Overall transfer level: Needs assistance Equipment used: Rolling walker (2 wheeled) Transfers: Sit to/from Stand Sit to Stand: Min guard         General transfer comment: min verbal cues for LE management and use of UEs to self assist  Ambulation/Gait Ambulation/Gait assistance: Min assist;Min guard Ambulation Distance (Feet): 135 Feet (and 15' to bathroom) Assistive device: Rolling walker (2 wheeled) Gait Pattern/deviations: Step-to pattern;Decreased step length - right;Decreased step length - left;Shuffle;Trunk flexed Gait velocity: decr   General Gait Details: cues for posture, sequence and position from [employer] Mobility    Modified Rankin (Stroke Patients Only)       Balance                                    Cognition Arousal/Alertness: Awake/alert Behavior During Therapy: WFL for tasks assessed/performed Overall Cognitive Status: Within Functional Limits for tasks assessed                      Exercises      General Comments General comments (skin integrity, edema, etc.): min guard assist standing to pull up gown.      Pertinent Vitals/Pain Pain Assessment: 0-10 Pain Score: 4  Pain Location: L knee Pain Descriptors / Indicators: Aching;Sore Pain Intervention(s): Limited activity within patient's tolerance;Monitored during session;Premedicated before session;Ice applied    Home Living Family/patient expects to be discharged to:: Skilled nursing facility Living Arrangements: Alone                  Prior Function Level of Independence: Independent      Comments: pt states she worked up until a week ago and was independent with all ADL.   PT Goals (current goals can now be found in the care plan section) Acute Rehab PT Goals Patient Stated Goal: return to independence PT Goal Formulation: With patient Time For Goal Achievement: 09/08/14 Potential to Achieve Goals: Good Progress towards PT goals: Progressing toward goals    Frequency  7X/week    PT Plan Current plan remains appropriate    Co-evaluation             End of Session   Activity Tolerance: Patient tolerated treatment well Patient left: in bed;with call bell/phone within reach     Time: 1093-2355 PT Time Calculation (min) (ACUTE ONLY): 23 min  Charges:  $  Gait Training: 8-22 mins $Therapeutic Activity: 8-22 mins                    G Codes:      Savannah Lewis Sep 09, 2014, 3:22 PM

## 2014-09-01 NOTE — Progress Notes (Signed)
Clinical Social Work Department CLINICAL SOCIAL WORK PLACEMENT NOTE 09/01/2014  Patient:  Savannah Lewis, Savannah Lewis  Account Number:  [id number] Admit date:  08/31/2014  Clinical Social Worker:  Werner Lean, LCSW  Date/time:  09/01/2014 03:03 PM  Clinical Social Work is seeking post-discharge placement for this patient at the following level of care:   SKILLED NURSING   (*CSW will update this form in Epic as items are completed)   09/01/2014  Patient/family provided with [place] Department of Clinical Social Work's list of facilities offering this level of care within the geographic area requested by the patient (or if unable, by the patient's family).  09/01/2014  Patient/family informed of their freedom to choose among providers that offer the needed level of care, that participate in Medicare, Medicaid or managed care program needed by the patient, have an available bed and are willing to accept the patient.  09/01/2014  Patient/family informed of [facility]' ownership interest in [facility], as well as of the fact that they are under no obligation to receive care at this facility.  PASARR submitted to EDS on 09/01/2014 PASARR number received on 09/01/2014  FL2 transmitted to all facilities in geographic area requested by pt/family on  09/01/2014 FL2 transmitted to all facilities within larger geographic area on   Patient informed that his/her managed care company has contracts with or will negotiate with  certain facilities, including the following:     Patient/family informed of bed offers received:  09/01/2014 Patient chooses bed at [place] Physician recommends and patient chooses bed at    Patient to be transferred to  on   Patient to be transferred to facility by  Patient and family notified of transfer on  Name of family member notified:    The following physician request were entered in Epic:   Additional Comments:

## 2014-09-01 NOTE — Evaluation (Addendum)
Occupational Therapy Evaluation Patient Details Name: Savannah Lewis MRN: [id number] DOB: [birth year] Today's Date: 09/01/2014    History of Present Illness Pt is s/p LEFT TOTAL KNEE ARTHROPLASTY   Clinical Impression   Pt with minimal pain (1/10) today with functional tasks/ADL. Up to the bathroom to practice 3in1 transfer and stand at the sink to groom. Will follow on acute to progress ADL independence. Pt states she has no assist available at home.    Follow Up Recommendations  SNF;Supervision/Assistance - 24 hour    Equipment Recommendations  3 in 1 bedside comode    Recommendations for Other Services       Precautions / Restrictions Precautions Precautions: Knee Required Braces or Orthoses: Knee Immobilizer - Left Knee Immobilizer - Left: Discontinue once straight leg raise with < 10 degree lag Restrictions Weight Bearing Restrictions: No Other Position/Activity Restrictions: WBAT      Mobility Bed Mobility                  Transfers Overall transfer level: Needs assistance Equipment used: Rolling walker (2 wheeled) Transfers: Sit to/from Stand Sit to Stand: Min guard         General transfer comment: min verbal cues for safety.    Balance                                            ADL Overall ADL's : Needs assistance/impaired Eating/Feeding: Independent;Sitting   Grooming: Wash/dry hands;Min guard;Standing   Upper Body Bathing: Set up;Sitting   Lower Body Bathing: Min assist; Sit to/from stand (to help don KI to stand and wash periareas)   Upper Body Dressing : Set up;Sitting   Lower Body Dressing: Minimal assistance;Sit to/from stand Lower Body Dressing Details (indicate cue type and reason): assist with donning KI Toilet Transfer: Minimal assistance;Ambulation;BSC;RW Toilet Transfer Details (indicate cue type and reason): min assist to negotiate around obstacles in the bathroom with tighter space.  Toileting- Marine scientist and Hygiene: Min guard;Sit to/from stand         General ADL Comments: Pt needs min cues for hand placement for 3in1 use. Needs min assist to help position KI properly. She is motivated and with little pain reported during session (1/10). She is currently able to reach down and don/doff L sock without assist.      Vision                     Perception     Praxis      Pertinent Vitals/Pain Pain Assessment: 0-10 Pain Score: 1  Pain Descriptors / Indicators: Sore Pain Intervention(s): Ice applied;Repositioned     Hand Dominance     Extremity/Trunk Assessment Upper Extremity Assessment Upper Extremity Assessment: Overall WFL for tasks assessed           Communication Communication Communication: No difficulties   Cognition Arousal/Alertness: Awake/alert Behavior During Therapy: WFL for tasks assessed/performed Overall Cognitive Status: Within Functional Limits for tasks assessed                     General Comments       Exercises       Shoulder Instructions      Home Living Family/patient expects to be discharged to:: Skilled nursing facility Living Arrangements: Alone  Prior Functioning/Environment Level of Independence: Independent        Comments: pt states she worked up until a week ago and was independent with all ADL.    OT Diagnosis: Generalized weakness   OT Problem List: Decreased knowledge of use of DME or AE;Decreased strength   OT Treatment/Interventions: Self-care/ADL training;Patient/family education;Therapeutic activities;DME and/or AE instruction    OT Goals(Current goals can be found in the care plan section) Acute Rehab OT Goals Patient Stated Goal: return to independence OT Goal Formulation: With patient Time For Goal Achievement: 09/08/14 Potential to Achieve Goals: Good  OT Frequency: Min 2X/week   Barriers to D/C:            Co-evaluation               End of Session Equipment Utilized During Treatment: Rolling walker;Left knee immobilizer  Activity Tolerance: Patient tolerated treatment well Patient left: in chair;with call bell/phone within reach   Time: 1032-1058 OT Time Calculation (min): 26 min Charges:  OT General Charges $OT Visit: 1 Procedure OT Evaluation $Initial OT Evaluation Tier I: 1 Procedure OT Treatments $Therapeutic Activity: 8-22 mins G-Codes:    Jules Schick  [phone] 09/01/2014, 11:36 AM

## 2014-09-01 NOTE — Progress Notes (Signed)
CSW spoke with pt to confirm d/c plan. Pt has decided to return home with [facility]. RNCM is assisting with d/c planning needs. [facility] has been contacted and bed offer declined.  Werner Lean LCSW [phone]

## 2014-09-01 NOTE — Progress Notes (Signed)
Subjective: 1 Day Post-Op Procedure(s) (LRB): LEFT TOTAL KNEE ARTHROPLASTY (Left) Patient reports pain as well controlled.  Tolerating PO's well. Reports a good night. Denies any Abdominal pain, CP, or SOB. No F/C or N/V.  Objective: Vital signs in last 24 hours: Temp:  [97.6 F (36.4 C)-98.2 F (36.8 C)] 97.9 F (36.6 C) (01/13 0532) Pulse Rate:  [62-92] 78 (01/13 0532) Resp:  [12-20] 16 (01/13 0532) BP: (97-160)/(55-72) 123/65 mmHg (01/13 0532) SpO2:  [97 %-100 %] 99 % (01/13 0532) Weight:  [61.689 kg (136 lb)] 61.689 kg (136 lb) (01/12 1615)  Intake/Output from previous day: 01/12 0701 - 01/13 0700 In: 3406.3 [P.O.:360; I.V.:2936.3; IV Piggyback:110] Out: 3915 [Urine:3525; Drains:340; Blood:50] Intake/Output this shift:     Recent Labs  09/01/14 0445  HGB 9.4*    Recent Labs  09/01/14 0445  WBC 9.1  RBC 3.60*  HCT 29.4*  PLT 188    Recent Labs  09/01/14 0445  NA 139  K 3.7  CL 105  CO2 27  BUN 15  CREATININE 0.66  GLUCOSE 144*  CALCIUM 8.6   No results for input(s): LABPT, INR in the last 72 hours.  Alert and oriented x3. RRR, Lungs clear, BS x4. Left Calf soft and non tender. L knee dressing C/D/I. No DVT signs. No signs of infection or compartment syndrome. LLE neurovascularly intact.  Left knee drain d/c'ed today. Pt tolerated well. Tip was intact. Hemostasis was obtained.  Assessment/Plan: 1 Day Post-Op S/P Left TKA Up with PT Drain D/c'ed Continue current care Plan for Rehab at D/c  Lajean Manes 09/01/2014, 7:31 AM

## 2014-09-02 LAB — CBC
HCT: 25.8 % — ABNORMAL LOW (ref 36.0–46.0)
HEMOGLOBIN: 8.5 g/dL — AB (ref 12.0–15.0)
MCH: 26.7 pg (ref 26.0–34.0)
MCHC: 32.9 g/dL (ref 30.0–36.0)
MCV: 81.1 fL (ref 78.0–100.0)
Platelets: 173 10*3/uL (ref 150–400)
RBC: 3.18 MIL/uL — ABNORMAL LOW (ref 3.87–5.11)
RDW: 14.9 % (ref 11.5–15.5)
WBC: 7.4 10*3/uL (ref 4.0–10.5)

## 2014-09-02 MED FILL — Sodium Chloride Flush IV Soln 0.9%: INTRAVENOUS | Qty: 30 | Status: AC

## 2014-09-02 NOTE — Consult Note (Signed)
Came to visit patient on behalf of Link to [facility] Care Management for [employer] employees/dependents with [facility] insurance. Discussed the Link to Wellness program as it would benefit her for DM and HTN management if needed. Patient states she does feel like she needs disease management and follow up for her DM. States she also obtains her medications at little or no cost. However, she is agreeable to post hospital discharge call. Left contact information and brochure at bedside. Inpatient RNCM aware of bedside visit.  Marthenia Rolling, MSN- [facility] Liaison708 [phone]

## 2014-09-02 NOTE — Progress Notes (Signed)
Physical Therapy Treatment Patient Details Name: [place] B [place] MRN: [id number] DOB: 1940/11/09 Today's Date: 09/02/2014    History of Present Illness Pt is s/p LEFT TOTAL KNEE ARTHROPLASTY    PT Comments    POD # 2 pm session pt in bed.  Applied KI, assisted OOB to amb to BR.  Assisted with amb in hallway a limited distance due to mild c/o nausea.  Pt reports poor appetite today.  Assisted back to bed applied ICE to knee and heat to thigh.     Follow Up Recommendations  Home health PT (pt declined SNF options)     Equipment Recommendations  Rolling walker with 5" wheels;3in1 (PT)    Recommendations for Other Services       Precautions / Restrictions Precautions Precautions: Knee Precaution Comments: instructed pt on KI use for amb Required Braces or Orthoses: Knee Immobilizer - Left Knee Immobilizer - Left: Discontinue once straight leg raise with < 10 degree lag Restrictions Weight Bearing Restrictions: No Other Position/Activity Restrictions: WBAT    Mobility  Bed Mobility               General bed mobility comments: pt OOB in recliner  Transfers Overall transfer level: Needs assistance Equipment used: Rolling walker (2 wheeled) Transfers: Sit to/from Stand Sit to Stand: Min guard         General transfer comment: verbalc ues for hand placement.plus increased time  Ambulation/Gait Ambulation/Gait assistance: Min guard;Min assist Ambulation Distance (Feet): 65 Feet Assistive device: Rolling walker (2 wheeled) Gait Pattern/deviations: Step-to pattern;Decreased stance time - left;Trunk flexed Gait velocity: decreased   General Gait Details: 50% VC's on proper walker to self distance and L LE placement.   Stairs            Wheelchair Mobility    Modified Rankin (Stroke Patients Only)       Balance                                    Cognition Arousal/Alertness: Awake/alert Behavior During Therapy: WFL for tasks  assessed/performed Overall Cognitive Status: Within Functional Limits for tasks assessed                      Exercises      General Comments        Pertinent Vitals/Pain Pain Assessment: 0-10 Pain Score: 5  Pain Location: L thigh pain 5/10 and L knee pain 2/10 Pain Descriptors / Indicators: Aching;Sore;Tightness Pain Intervention(s): Monitored during session;Premedicated before session;Repositioned;Ice applied    Home Living                      Prior Function            PT Goals (current goals can now be found in the care plan section) Progress towards PT goals: Progressing toward goals    Frequency  7X/week    PT Plan Current plan remains appropriate    Co-evaluation             End of Session Equipment Utilized During Treatment: Gait belt;Left knee immobilizer Activity Tolerance: Patient tolerated treatment well Patient left: in chair;with call bell/phone within reach     Time: 1350-1414 PT Time Calculation (min) (ACUTE ONLY): 24 min  Charges:  $Gait Training: 8-22 mins $Therapeutic Activity: 8-22 mins  G Codes:      Rica Koyanagi  PTA [facility]  Acute  Rehab Pager      [phone]

## 2014-09-02 NOTE — Progress Notes (Addendum)
Occupational Therapy Treatment Patient Details Name: Savannah Lewis MRN: [id number] DOB: [birth year] Today's Date: 09/02/2014    History of present illness Pt is s/p LEFT TOTAL KNEE ARTHROPLASTY   OT comments  Pt is now planning d/c home and has her family to assist for several days. Practiced up to 3in1 and standing at the sink to groom. Still needs verbal cues for safety with hand placement for sitting down on commode. Will follow.   Follow Up Recommendations  Supervision/Assistance - 24 hour;No OT follow up    Equipment Recommendations  3 in 1 bedside comode    Recommendations for Other Services      Precautions / Restrictions Precautions Precautions: Knee Required Braces or Orthoses: Knee Immobilizer - Left Knee Immobilizer - Left: Discontinue once straight leg raise with < 10 degree lag Restrictions Weight Bearing Restrictions: No Other Position/Activity Restrictions: WBAT       Mobility Bed Mobility                  Transfers Overall transfer level: Needs assistance Equipment used: Rolling walker (2 wheeled) Transfers: Sit to/from Stand Sit to Stand: Min guard         General transfer comment: verbalc ues for hand placement.    Balance                                   ADL       Grooming: Wash/dry hands;Min Dispensing optician: Minimal assistance;Ambulation;BSC;RW   Toileting- Water quality scientist and Hygiene: Min guard;Sit to/from stand         General ADL Comments: Discussed options for tub/shower transfer including if 3in1 will fit in tub facing forward and sitting back on 3in1 with nonoperated LE stepping back into tub and leaving operated outside of tub. Pt states she will sponge bathe for awhile. Discussed how to adjust 3in1 for proper height. Pt needed min cues to reach back to sit on 3in1 as she started to sit down with both hands on walker. Did better with practice. Reviewed sequence  for LB dressing. Pt states she donned underwear and socks on herself with standby assist. She has family that will stay with her through the weekend and plans to d/c home now.       Vision                     Perception     Praxis      Cognition   Behavior During Therapy: WFL for tasks assessed/performed Overall Cognitive Status: Within Functional Limits for tasks assessed                       Extremity/Trunk Assessment               Exercises     Shoulder Instructions       General Comments      Pertinent Vitals/ Pain       Pain Assessment: No/denies pain  Home Living                                          Prior Functioning/Environment  Frequency Min 2X/week     Progress Toward Goals  OT Goals(current goals can now be found in the care plan section)  Progress towards OT goals: Progressing toward goals     Plan Discharge plan remains appropriate    Co-evaluation                 End of Session Equipment Utilized During Treatment: Rolling walker;Left knee immobilizer   Activity Tolerance Patient tolerated treatment well   Patient Left in chair;with call bell/phone within reach   Nurse Communication          Time: 2831-5176 OT Time Calculation (min): 23 min  Charges: OT General Charges $OT Visit: 1 Procedure OT Treatments $Therapeutic Activity: 8-22 mins Self care: 8-22 mins  Jules Schick  [phone] 09/02/2014, 9:20 AM

## 2014-09-02 NOTE — Progress Notes (Signed)
Physical Therapy Treatment Patient Details Name: Savannah Lewis MRN: [id number] DOB: 1941/07/17 Today's Date: 09/02/2014    History of Present Illness Pt is s/p LEFT TOTAL KNEE ARTHROPLASTY    PT Comments    POD # 2 am session.  Pt OOB in recliner feeling tiered.  Applied KI and instructed on proper application and use for amb.  Assisted with amb in hallway then returned to room to perform TKR TE's followed by ICE to knee and heat to thigh. Pt plans to D/C to home tomorrow when she will have A at home.  Follow Up Recommendations  Home health PT (pt declined SNF options)     Equipment Recommendations  Rolling walker with 5" wheels;3in1 (PT)    Recommendations for Other Services       Precautions / Restrictions Precautions Precautions: Knee Precaution Comments: instructed pt on KI use for amb Required Braces or Orthoses: Knee Immobilizer - Left Knee Immobilizer - Left: Discontinue once straight leg raise with < 10 degree lag Restrictions Weight Bearing Restrictions: No Other Position/Activity Restrictions: WBAT    Mobility  Bed Mobility               General bed mobility comments: pt OOB in recliner  Transfers Overall transfer level: Needs assistance Equipment used: Rolling walker (2 wheeled) Transfers: Sit to/from Stand Sit to Stand: Min guard         General transfer comment: verbalc ues for hand placement.plus increased time  Ambulation/Gait Ambulation/Gait assistance: Min guard;Min assist Ambulation Distance (Feet): 85 Feet Assistive device: Rolling walker (2 wheeled) Gait Pattern/deviations: Step-to pattern;Decreased stance time - left;Trunk flexed Gait velocity: decreased   General Gait Details: 50% VC's on proper walker to self distance and L LE placement.   Stairs            Wheelchair Mobility    Modified Rankin (Stroke Patients Only)       Balance                                    Cognition Arousal/Alertness:  Awake/alert Behavior During Therapy: WFL for tasks assessed/performed Overall Cognitive Status: Within Functional Limits for tasks assessed                      Exercises   Total Knee Replacement TE's 10 reps B LE ankle pumps 10 reps towel squeezes 10 reps knee presses 10 reps heel slides  10 reps SAQ's 10 reps SLR's 10 reps ABD Followed by ICE     General Comments        Pertinent Vitals/Pain Pain Assessment: 0-10 Pain Score: 5  Pain Location: L thigh pain 5/10 and L knee pain 2/10 Pain Descriptors / Indicators: Aching;Sore;Tightness Pain Intervention(s): Monitored during session;Premedicated before session;Repositioned;Ice applied    Home Living                      Prior Function            PT Goals (current goals can now be found in the care plan section) Progress towards PT goals: Progressing toward goals    Frequency  7X/week    PT Plan Current plan remains appropriate    Co-evaluation             End of Session Equipment Utilized During Treatment: Gait belt;Left knee immobilizer Activity Tolerance: Patient tolerated treatment well Patient left: in  chair;with call bell/phone within reach     Time: 0952-1021 PT Time Calculation (min) (ACUTE ONLY): 29 min  Charges:  $Gait Training: 8-22 mins $Therapeutic Exercise: 8-22 mins                    G Codes:      Rica Koyanagi  PTA [facility]  Acute  Rehab Pager      [phone]

## 2014-09-02 NOTE — Progress Notes (Signed)
Subjective: 2 Days Post-Op Procedure(s) (LRB): LEFT TOTAL KNEE ARTHROPLASTY (Left) Patient reports pain as mild to left knee and thigh region.  Reports a fair night. Progressing with PT. Tolerating PO's well. Denies SOB, ABd pain, or CP. No N/V.   Objective: Vital signs in last 24 hours: Temp:  [97.9 F (36.6 C)-100.4 F (38 C)] 99.5 F (37.5 C) (01/14 0645) Pulse Rate:  [81-102] 102 (01/14 0505) Resp:  [16-18] 17 (01/14 0505) BP: (114-135)/(57-67) 135/66 mmHg (01/14 0505) SpO2:  [96 %-100 %] 98 % (01/14 0505)  Intake/Output from previous day: 01/13 0701 - 01/14 0700 In: 583.7 [P.O.:480; I.V.:103.7] Out: 700 [Urine:700] Intake/Output this shift:     Recent Labs  09/01/14 0445 09/02/14 0443  HGB 9.4* 8.5*    Recent Labs  09/01/14 0445 09/02/14 0443  WBC 9.1 7.4  RBC 3.60* 3.18*  HCT 29.4* 25.8*  PLT 188 173    Recent Labs  09/01/14 0445  NA 139  K 3.7  CL 105  CO2 27  BUN 15  CREATININE 0.66  GLUCOSE 144*  CALCIUM 8.6   No results for input(s): LABPT, INR in the last 72 hours.  Alert and oriented x3. RRR, Lungs clear, BS x4. Left Calf soft and non tender. L knee dressing C/D/I. No DVT signs. No signs of infection or compartment syndrome. LLE neurovascularly intact.  Assessment/Plan: 2 Days Post-Op Procedure(s) (LRB): LEFT TOTAL KNEE ARTHROPLASTY (Left) Up with PT Ace wrap D/c'ed Apply Ted stocking Continue current care Plan to D/c home tomorrow with family and home PT  Acute post-op anemia: Monitor labs Asymptomatic   Ananda Sitzer L 09/02/2014, 7:26 AM

## 2014-09-03 LAB — CBC
HCT: 24.7 % — ABNORMAL LOW (ref 36.0–46.0)
HEMOGLOBIN: 8.1 g/dL — AB (ref 12.0–15.0)
MCH: 26.5 pg (ref 26.0–34.0)
MCHC: 32.8 g/dL (ref 30.0–36.0)
MCV: 80.7 fL (ref 78.0–100.0)
Platelets: 166 10*3/uL (ref 150–400)
RBC: 3.06 MIL/uL — AB (ref 3.87–5.11)
RDW: 14.9 % (ref 11.5–15.5)
WBC: 7.8 10*3/uL (ref 4.0–10.5)

## 2014-09-03 MED ORDER — OXYCODONE-ACETAMINOPHEN 5-325 MG PO TABS: 1.0000 | ORAL_TABLET | ORAL | Status: DC | PRN

## 2014-09-03 MED ORDER — METHOCARBAMOL 500 MG PO TABS: 500.0000 mg | ORAL_TABLET | Freq: Four times a day (QID) | ORAL | Status: DC | PRN

## 2014-09-03 MED ORDER — FERROUS SULFATE 325 (65 FE) MG PO TABS: 325.0000 mg | ORAL_TABLET | Freq: Three times a day (TID) | ORAL | Status: DC

## 2014-09-03 MED ORDER — ASPIRIN EC 325 MG PO TBEC: 325.0000 mg | DELAYED_RELEASE_TABLET | Freq: Two times a day (BID) | ORAL | Status: DC

## 2014-09-03 NOTE — Discharge Summary (Signed)
Physician Discharge Summary  Patient ID: Savannah Lewis MRN: [id number] DOB/AGE: 74-Jul-1942 74 y.o.  Admit date: 08/31/2014 Discharge date: 09/03/2014  Admission Diagnoses: Left knee OA  Discharge Diagnoses:  Active Problems:   Status post left knee replacement   S/P knee replacement   Discharged Condition: good  Hospital Course:  Savannah Lewis is a 74 y.o. who was admitted to [facility]. They were brought to the operating room on 08/31/2014 and underwent Procedure(s): LEFT TOTAL KNEE ARTHROPLASTY.  Patient tolerated the procedure well and was later transferred to the recovery room and then to the orthopaedic floor for postoperative care.  They were given PO and IV analgesics for pain control following their surgery.  They were given 24 hours of postoperative antibiotics of  Anti-infectives    Start     Dose/Rate Route Frequency Ordered Stop   08/31/14 2000  ceFAZolin (ANCEF) IVPB 2 g/50 mL premix     2 g100 mL/hr over 30 Minutes Intravenous Every 6 hours 08/31/14 1619 09/01/14 0230   08/31/14 1025  ceFAZolin (ANCEF) IVPB 2 g/50 mL premix     2 g100 mL/hr over 30 Minutes Intravenous On call to O.R. 08/31/14 1025 08/31/14 1300     and started on DVT prophylaxis in the form of lovenox.   PT and OT were ordered for total joint protocol.  Discharge planning consulted to help with postop disposition and equipment needs.  Patient had a good night on the evening of surgery and started to get up OOB with therapy on day one.  Hemovac drain was pulled without difficulty.  Continued to work with therapy into day two.  Dressing was WNL.  By day three, the patient had progressed with therapy and meeting their goals.  Incision was healing well.  Patient was seen in rounds and was ready to go home.  Consults: N/a  Significant Diagnostic Studies: Routine TKS  Treatments: routine TKa  Discharge Exam: Blood pressure 130/57, pulse 98, temperature 99.8 F (37.7 C), temperature source Oral,  resp. rate 16, height 5\' 2"  (1.575 m), weight 61.689 kg (136 lb), SpO2 98 %. Alert and oriented x3. RRR, Lungs clear, BS x4. Left Calf soft and non tender. L knee dressing C/D/I. No DVT signs. No signs of infection or compartment syndrome. LLE neurovascularly intact.   Disposition: 01-Home or Self Care  Discharge Instructions    Call MD / Call 911    Complete by:  As directed   If you experience chest pain or shortness of breath, CALL 911 and be transported to the hospital emergency room.  If you develope a fever above 101 F, pus (white drainage) or increased drainage or redness at the wound, or calf pain, call your surgeon's office.     Constipation Prevention    Complete by:  As directed   Drink plenty of fluids.  Prune juice may be helpful.  You may use a stool softener, such as Colace (over the counter) 100 mg twice a day.  Use MiraLax (over the counter) for constipation as needed.     Diet - low sodium heart healthy    Complete by:  As directed      Discharge instructions    Complete by:  As directed   Call and make an appointment for 2 weeks from now ([phone]). Leave dressing in place, may remove the drain site dressing on the side. Place a band aid and neosporin over the site. May shower. Start home PT. Follow D/C instructions.  Take medications as directed. Weight bear as tolerated with assistive devices.     Increase activity slowly as tolerated    Complete by:  As directed             Medication List    STOP taking these medications        acetaminophen 325 MG tablet  Commonly known as:  TYLENOL     aspirin 81 MG tablet  Replaced by:  aspirin EC 325 MG tablet     Hydrocodone-Acetaminophen 5-300 MG Tabs  Commonly known as:  VICODIN     traMADol 50 MG tablet  Commonly known as:  ULTRAM      TAKE these medications        amLODipine 10 MG tablet  Commonly known as:  NORVASC  Take 10 mg by mouth every morning.     aspirin EC 325 MG tablet  Take 1 tablet (325 mg  total) by mouth 2 (two) times daily.     cetirizine 10 MG tablet  Commonly known as:  ZYRTEC  Take 10 mg by mouth daily.     docusate sodium 100 MG capsule  Commonly known as:  COLACE  Take 1 capsule (100 mg total) by mouth 2 (two) times daily.     ferrous sulfate 325 (65 FE) MG tablet  Take 1 tablet (325 mg total) by mouth 3 (three) times daily after meals.     losartan-hydrochlorothiazide 100-25 MG per tablet  Commonly known as:  HYZAAR  Take 1 tablet by mouth daily.     metFORMIN 1000 MG tablet  Commonly known as:  GLUCOPHAGE  Take 1,000 mg by mouth 2 (two) times daily with a meal.     methocarbamol 500 MG tablet  Commonly known as:  ROBAXIN  Take 1 tablet (500 mg total) by mouth every 6 (six) hours as needed for muscle spasms.     multivitamin with minerals tablet  Take 1 tablet by mouth daily.     oxyCODONE-acetaminophen 5-325 MG per tablet  Commonly known as:  ROXICET  Take 1-2 tablets by mouth every 4 (four) hours as needed for severe pain.     potassium chloride 10 MEQ tablet  Commonly known as:  K-DUR  Take 10 mEq by mouth 2 (two) times daily.           Follow-up Information    Follow up with [facility].   Why:  rolling walker and 3n1 (commode)   Contact information:   [place] [address] [phone]       Follow up with [facility].   Why:  home health physical therapy   Contact information:   [place] [address] [phone]       Signed: Lajean Manes 09/03/2014, 7:45 AM

## 2014-09-03 NOTE — Progress Notes (Signed)
Subjective: 3 Days Post-Op Procedure(s) (LRB): LEFT TOTAL KNEE ARTHROPLASTY (Left) Patient reports pain as mild to left knee.  Reports a good night. Progressing with PT. Tolerating Po's. Positive flatus. Denies CP, SOb, or calf pain.  Objective: Vital signs in last 24 hours: Temp:  [99.8 F (37.7 C)-100.6 F (38.1 C)] 99.8 F (37.7 C) (01/15 0501) Pulse Rate:  [98-106] 98 (01/15 0501) Resp:  [16-18] 16 (01/15 0501) BP: (114-130)/(57-72) 130/57 mmHg (01/15 0501) SpO2:  [98 %-100 %] 98 % (01/15 0501)  Intake/Output from previous day: 01/14 0701 - 01/15 0700 In: 480 [P.O.:480] Out: -  Intake/Output this shift:     Recent Labs  09/01/14 0445 09/02/14 0443 09/03/14 0447  HGB 9.4* 8.5* 8.1*    Recent Labs  09/02/14 0443 09/03/14 0447  WBC 7.4 7.8  RBC 3.18* 3.06*  HCT 25.8* 24.7*  PLT 173 166    Recent Labs  09/01/14 0445  NA 139  K 3.7  CL 105  CO2 27  BUN 15  CREATININE 0.66  GLUCOSE 144*  CALCIUM 8.6   No results for input(s): LABPT, INR in the last 72 hours.  Alert and oriented x3. RRR, Lungs clear, BS x4. Left Calf soft and non tender. L knee dressing C/D/I. No DVT signs. No signs of infection or compartment syndrome. LLE neurovascularly intact.   Assessment/Plan: 3 Days Post-Op Procedure(s) (LRB): LEFT TOTAL KNEE ARTHROPLASTY (Left) D/c home Home PT F/u in office in 2weeks Follow instructions  Acute post-op anemia: Add Iron Pt asymptomatic   Blessings Inglett L 09/03/2014, 7:37 AM

## 2014-09-03 NOTE — Progress Notes (Signed)
Physical Therapy Treatment Patient Details Name: Savannah Lewis MRN: [id number] DOB: November 16, 1940 Today's Date: 09/03/2014    History of Present Illness Pt is s/p LEFT TOTAL KNEE ARTHROPLASTY    PT Comments    POD # 3 pt reports poor sleep last. Applied KI as pt was unable to perform SLR.  Assisted OOB to amb to BR then in hallway.  Returned to bed to perform TKR TE's following HEP handout followed by ICE. Pt is ready to D/C to home today.   Follow Up Recommendations  Home health PT     Equipment Recommendations  Rolling walker with 5" wheels;3in1 (PT)    Recommendations for Other Services       Precautions / Restrictions Precautions Precautions: Knee Precaution Comments: instructed pt on KI use for amb Required Braces or Orthoses: Knee Immobilizer - Left Knee Immobilizer - Left: Discontinue once straight leg raise with < 10 degree lag Restrictions Weight Bearing Restrictions: No Other Position/Activity Restrictions: WBAT    Mobility  Bed Mobility Overal bed mobility: Needs Assistance Bed Mobility: Supine to Sit;Sit to Supine     Supine to sit: Min assist Sit to supine: Min assist   General bed mobility comments: min assist for L LE only  Transfers Overall transfer level: Needs assistance Equipment used: Rolling walker (2 wheeled) Transfers: Sit to/from Stand Sit to Stand: Supervision;Min guard         General transfer comment: verbalc ues for hand placement.plus increased time  Ambulation/Gait Ambulation/Gait assistance: Min guard;Supervision Ambulation Distance (Feet): 70 Feet Assistive device: Rolling walker (2 wheeled) Gait Pattern/deviations: Step-to pattern;Decreased stance time - left Gait velocity: decreased   General Gait Details: one VC safety with turns   Science writer    Modified Rankin (Stroke Patients Only)       Balance                                    Cognition                            Exercises   Total Knee Replacement TE's 10 reps B LE ankle pumps 10 reps towel squeezes 10 reps knee presses 10 reps heel slides  10 reps SAQ's 10 reps SLR's 10 reps ABD Followed by ICE     General Comments        Pertinent Vitals/Pain Pain Assessment: 0-10 Pain Score: 5  Pain Descriptors / Indicators: Sore Pain Intervention(s): Monitored during session;Premedicated before session;Repositioned;Ice applied    Home Living                      Prior Function            PT Goals (current goals can now be found in the care plan section) Progress towards PT goals: Progressing toward goals    Frequency  7X/week    PT Plan      Co-evaluation             End of Session Equipment Utilized During Treatment: Gait belt;Left knee immobilizer Activity Tolerance: Patient tolerated treatment well Patient left: with call bell/phone within reach;in bed     Time: 1200-1240 PT Time Calculation (min) (ACUTE ONLY): 40 min  Charges:  $Gait Training: 8-22 mins $Therapeutic Exercise: 8-22 mins $Therapeutic Activity: 8-22 mins  G Codes:      Rica Koyanagi  PTA [facility]  Acute  Rehab Pager      [phone]

## 2014-09-07 ENCOUNTER — Encounter (HOSPITAL_COMMUNITY): Payer: Self-pay | Admitting: Specialist

## 2014-09-20 ENCOUNTER — Encounter: Payer: Self-pay | Admitting: Internal Medicine

## 2014-10-19 ENCOUNTER — Encounter
Admit: 2014-10-19 | Disposition: A | Discharge status: Home / Self Care | Payer: Self-pay | Attending: Internal Medicine | Admitting: Internal Medicine

## 2014-11-19 ENCOUNTER — Encounter
Admit: 2014-11-19 | Disposition: A | Discharge status: Home / Self Care | Payer: Self-pay | Attending: Internal Medicine | Admitting: Internal Medicine

## 2015-08-09 ENCOUNTER — Ambulatory Visit (INDEPENDENT_AMBULATORY_CARE_PROVIDER_SITE_OTHER): Payer: 59

## 2015-08-09 ENCOUNTER — Ambulatory Visit (INDEPENDENT_AMBULATORY_CARE_PROVIDER_SITE_OTHER): Payer: 59 | Admitting: Urgent Care

## 2015-08-09 VITALS — BP 130/74 | HR 81 | Temp 97.5°F | Resp 16 | Ht 62.5 in | Wt 136.2 lb

## 2015-08-09 DIAGNOSIS — R109 Unspecified abdominal pain: Secondary | ICD-10-CM

## 2015-08-09 DIAGNOSIS — R0989 Other specified symptoms and signs involving the circulatory and respiratory systems: Secondary | ICD-10-CM

## 2015-08-09 DIAGNOSIS — H9203 Otalgia, bilateral: Secondary | ICD-10-CM

## 2015-08-09 DIAGNOSIS — J029 Acute pharyngitis, unspecified: Secondary | ICD-10-CM

## 2015-08-09 DIAGNOSIS — J069 Acute upper respiratory infection, unspecified: Secondary | ICD-10-CM | POA: Diagnosis not present

## 2015-08-09 DIAGNOSIS — R05 Cough: Secondary | ICD-10-CM | POA: Diagnosis not present

## 2015-08-09 DIAGNOSIS — R059 Cough, unspecified: Secondary | ICD-10-CM

## 2015-08-09 LAB — POC MICROSCOPIC URINALYSIS (UMFC): Mucus: ABSENT

## 2015-08-09 LAB — POCT URINALYSIS DIP (MANUAL ENTRY)
BILIRUBIN UA: NEGATIVE
Blood, UA: NEGATIVE
GLUCOSE UA: NEGATIVE
Ketones, POC UA: NEGATIVE
LEUKOCYTES UA: NEGATIVE
NITRITE UA: NEGATIVE
Protein Ur, POC: NEGATIVE
Spec Grav, UA: 1.015
UROBILINOGEN UA: 0.2
pH, UA: 6

## 2015-08-09 MED ORDER — BENZONATATE 100 MG PO CAPS: 100.0000 mg | ORAL_CAPSULE | Freq: Three times a day (TID) | ORAL | Status: DC | PRN

## 2015-08-09 MED ORDER — CYCLOBENZAPRINE HCL 5 MG PO TABS: 5.0000 mg | ORAL_TABLET | Freq: Every day | ORAL | Status: DC

## 2015-08-09 MED ORDER — HYDROCODONE-HOMATROPINE 5-1.5 MG/5ML PO SYRP: 5.0000 mL | ORAL_SOLUTION | Freq: Every evening | ORAL | Status: DC | PRN

## 2015-08-09 NOTE — Progress Notes (Signed)
MRN: [id number] DOB: 1941/01/02  Subjective:   Savannah Lewis is a 74 y.o. female presenting for chief complaint of Sore Throat; Otalgia; chest congestion; and Back Pain  Sore throat - Reports a 3 day history of sore throat, bilateral moderate ear pain, mildly productive cough, chest congestion. Cough is worse when she lays down. Also has sinus congestion. Has tried Tussin with minimal relief. Patient takes Flonase and Zyrtec for allergies. Denies fever, chest pain, shob, wheezing, n/v, abdominal pain. Denies history of asthma. Denies smoking cigarettes.  Back pain - Reports a 4 day history of right intermittent flank pain. It is improved today after taking 2 Tylenol. ROS as above. Denies dysuria, hematuria, frequent urination, constipation. Of note, patient was written a script for an unknown medication by her PCP, Dr. Laurann Montana, but she was unable to get it filled.  Savannah Lewis has a current medication list which includes the following prescription(s): amlodipine, aspirin ec, cetirizine, ferrous sulfate, losartan-hydrochlorothiazide, metformin, multivitamin with minerals, potassium chloride, docusate sodium, methocarbamol, and oxycodone-acetaminophen. Also has No Known Allergies.  Savannah Lewis  has a past medical history of Hypertension; Type 2 diabetes mellitus ([place]); History of Bell's palsy; Thoracic ascending aortic aneurysm ([place]); Diverticulosis; History of adenomatous polyp of colon; De Quervain's tenosynovitis, left; Diabetic retinopathy, nonproliferative ([place]); DJD (degenerative joint disease); DDD (degenerative disc disease), cervical; Cervical spondylosis; TMJ (temporomandibular joint syndrome); Wears glasses; Heart murmur; and Thoracic aortic aneurysm ([place]). Also  has past surgical history that includes Colonoscopy (N/A, 04/14/2013); Knee arthroscopy (Bilateral, RIGHT 2002 & 1994/   LEFT  1995); Total knee arthroplasty (Right, 07-01-2009); Rotator cuff repair (Right, 08-31-2008); LAPAROSCOPY ASSISTED   RIGHT COLECTOMY (03-22-2005); Abdominal hysterectomy (1995); Dorsal compartment release (Left, 09/24/2013); Total knee arthroplasty (Left, 08/31/2014); and Joint replacement.  Objective:   Vitals: BP 130/74 mmHg  Pulse 81  Temp(Src) 97.5 F (36.4 C) (Oral)  Resp 16  Ht 5' 2.5" (1.588 m)  Wt 136 lb 3.2 oz (61.78 kg)  BMI 24.50 kg/m2  SpO2 99%  Physical Exam  Constitutional: She is oriented to person, place, and time. She appears well-developed and well-nourished.  HENT:  TM's flat bilaterally, no effusions or erythema. Nasal turbinates inflamed with mucus. Frontal sinus tenderness. Postnasal drip present, without oropharyngeal exudates, erythema or abscesses.  Eyes: Right eye exhibits no discharge. Left eye exhibits no discharge. No scleral icterus.  Neck: Normal range of motion. Neck supple.  Cardiovascular: Normal rate, regular rhythm and intact distal pulses.  Exam reveals no gallop and no friction rub.   No murmur heard. Pulmonary/Chest: No respiratory distress. She has no wheezes. She has no rales.  Abdominal: Soft. Bowel sounds are normal. She exhibits no distension and no mass. There is no tenderness.  Musculoskeletal: She exhibits no edema.  Lymphadenopathy:    She has cervical adenopathy (bilateral, anterior).  Neurological: She is alert and oriented to person, place, and time.  Skin: Skin is warm and dry. No rash noted. No erythema. No pallor.   Dg Chest 2 View  08/09/2015  CLINICAL DATA:  Cough, chest congestion, and shortness of breath; history of diabetes,ascending aortic aneurysm, nonsmoker. EXAM: CHEST  2 VIEW COMPARISON:  PA and lateral chest x-ray of August 23, 2014 FINDINGS: The lungs are well-expanded and clear. No pulmonary parenchymal masses are observed. There is no pleural effusion. The heart is normal in size. There is tortuosity of the ascending and descending thoracic aorta which is stable. The mediastinum is normal in width. The bony thorax is unremarkable.  IMPRESSION: There  is no active cardiopulmonary disease. Electronically Signed   By: David  Lewis M.D.   On: 08/09/2015 09:09   Results for orders placed or performed in visit on 08/09/15 (from the past 24 hour(s))  POCT urinalysis dipstick     Status: None   Collection Time: 08/09/15  8:59 AM  Result Value Ref Range   Color, UA yellow yellow   Clarity, UA clear clear   Glucose, UA negative negative   Bilirubin, UA negative negative   Ketones, POC UA negative negative   Spec Grav, UA 1.015    Blood, UA negative negative   pH, UA 6.0    Protein Ur, POC negative negative   Urobilinogen, UA 0.2    Nitrite, UA Negative Negative   Leukocytes, UA Negative Negative  POCT Microscopic Urinalysis ([facility])     Status: Abnormal   Collection Time: 08/09/15  8:59 AM  Result Value Ref Range   WBC,UR,HPF,POC None None WBC/hpf   RBC,UR,HPF,POC None None RBC/hpf   Bacteria None None, Too numerous to count   Mucus Absent Absent   Epithelial Cells, UR Per Microscopy Few (A) None, Too numerous to count cells/hpf   Assessment and Plan :   1. Right flank pain - Likely musculoskeletal in origin. Physical exam and UA findings are reassuring, patient is also improved. Advised hydration and Flexeril if flank pain returns.  2. Acute upper respiratory infection 3. Sore throat 4. Ear pain, bilateral 5. Chest congestion 6. Cough - Likely viral in etiology. Physical exam and x-ray findings reassuring. Recommended supportive care, Hycodan and Tessalon for cough and sore throat. Provided work note for 2 days, rtc in 1 week if no improvement.  Jaynee Eagles, PA-C [facility] [phone] 08/09/2015 8:36 AM

## 2015-08-09 NOTE — Patient Instructions (Signed)
Upper Respiratory Infection, Adult Most upper respiratory infections (URIs) are a viral infection of the air passages leading to the lungs. A URI affects the nose, throat, and upper air passages. The most common type of URI is nasopharyngitis and is typically referred to as "the common cold." URIs run their course and usually go away on their own. Most of the time, a URI does not require medical attention, but sometimes a bacterial infection in the upper airways can follow a viral infection. This is called a secondary infection. Sinus and middle ear infections are common types of secondary upper respiratory infections. Bacterial pneumonia can also complicate a URI. A URI can worsen asthma and chronic obstructive pulmonary disease (COPD). Sometimes, these complications can require emergency medical care and may be life threatening.  CAUSES Almost all URIs are caused by viruses. A virus is a type of germ and can spread from one person to another.  RISKS FACTORS You may be at risk for a URI if:   You smoke.   You have chronic heart or lung disease.  You have a weakened defense (immune) system.   You are very young or very old.   You have nasal allergies or asthma.  You work in crowded or poorly ventilated areas.  You work in health care facilities or schools. SIGNS AND SYMPTOMS  Symptoms typically develop 2-3 days after you come in contact with a cold virus. Most viral URIs last 7-10 days. However, viral URIs from the influenza virus (flu virus) can last 14-18 days and are typically more severe. Symptoms may include:   Runny or stuffy (congested) nose.   Sneezing.   Cough.   Sore throat.   Headache.   Fatigue.   Fever.   Loss of appetite.   Pain in your forehead, behind your eyes, and over your cheekbones (sinus pain).  Muscle aches.  DIAGNOSIS  Your health care provider may diagnose a URI by:  Physical exam.  Tests to check that your symptoms are not due to  another condition such as:  Strep throat.  Sinusitis.  Pneumonia.  Asthma. TREATMENT  A URI goes away on its own with time. It cannot be cured with medicines, but medicines may be prescribed or recommended to relieve symptoms. Medicines may help:  Reduce your fever.  Reduce your cough.  Relieve nasal congestion. HOME CARE INSTRUCTIONS   Take medicines only as directed by your health care provider.   Gargle warm saltwater or take cough drops to comfort your throat as directed by your health care provider.  Use a warm mist humidifier or inhale steam from a shower to increase air moisture. This may make it easier to breathe.  Drink enough fluid to keep your urine clear or pale yellow.   Eat soups and other clear broths and maintain good nutrition.   Rest as needed.   Return to work when your temperature has returned to normal or as your health care provider advises. You may need to stay home longer to avoid infecting others. You can also use a face mask and careful hand washing to prevent spread of the virus.  Increase the usage of your inhaler if you have asthma.   Do not use any tobacco products, including cigarettes, chewing tobacco, or electronic cigarettes. If you need help quitting, ask your health care provider. PREVENTION  The best way to protect yourself from getting a cold is to practice good hygiene.   Avoid oral or hand contact with people with cold   symptoms.   Wash your hands often if contact occurs.  There is no clear evidence that vitamin C, vitamin E, echinacea, or exercise reduces the chance of developing a cold. However, it is always recommended to get plenty of rest, exercise, and practice good nutrition.  SEEK MEDICAL CARE IF:   You are getting worse rather than better.   Your symptoms are not controlled by medicine.   You have chills.  You have worsening shortness of breath.  You have brown or red mucus.  You have yellow or brown nasal  discharge.  You have pain in your face, especially when you bend forward.  You have a fever.  You have swollen neck glands.  You have pain while swallowing.  You have white areas in the back of your throat. SEEK IMMEDIATE MEDICAL CARE IF:   You have severe or persistent:  Headache.  Ear pain.  Sinus pain.  Chest pain.  You have chronic lung disease and any of the following:  Wheezing.  Prolonged cough.  Coughing up blood.  A change in your usual mucus.  You have a stiff neck.  You have changes in your:  Vision.  Hearing.  Thinking.  Mood. MAKE SURE YOU:   Understand these instructions.  Will watch your condition.  Will get help right away if you are not doing well or get worse.   This information is not intended to replace advice given to you by your health care provider. Make sure you discuss any questions you have with your health care provider.   Document Released: 01/30/2001 Document Revised: 12/21/2014 Document Reviewed: 11/11/2013 Elsevier Interactive Patient Education 2016 Elsevier Inc.  

## 2015-08-16 ENCOUNTER — Ambulatory Visit (INDEPENDENT_AMBULATORY_CARE_PROVIDER_SITE_OTHER): Payer: 59 | Admitting: Physician Assistant

## 2015-08-16 VITALS — BP 138/80 | HR 88 | Temp 98.3°F | Resp 16 | Ht 62.5 in | Wt 135.8 lb

## 2015-08-16 DIAGNOSIS — J209 Acute bronchitis, unspecified: Secondary | ICD-10-CM | POA: Diagnosis not present

## 2015-08-16 MED ORDER — GUAIFENESIN ER 1200 MG PO TB12: 1.0000 | ORAL_TABLET | Freq: Two times a day (BID) | ORAL | Status: DC | PRN

## 2015-08-16 MED ORDER — AZITHROMYCIN 250 MG PO TABS: ORAL_TABLET | ORAL | Status: DC

## 2015-08-16 NOTE — Progress Notes (Signed)
[facility] [address], [place] [address] [phone]  Date:  08/16/2015   Name:  Savannah Lewis   DOB:  11-08-1940   MRN:  [id number]  PCP:  Irven Shelling, MD    History of Present Illness:  Savannah Lewis is a 74 y.o. female patient who presents to [facility] for follow up.  She was seen here 7 days ago for 3 days of sore throat, otalgia, and mildly productive cough.  Treated supportively for a viral URI.  Today, she supports productive cough of gray and yellow sputum.  nasal congestion continues.  No facial pain.  No ear pain.  Feels very fatigued.  However no sob or dyspnea.  No fever.  She has taken the tessalon pearls which works very little. Hycodan was not given.  Pharmacist recommended trying the pearls prior to the hycodan.      Patient Active Problem List   Diagnosis Date Noted  . 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 S/P knee replacement 08/31/2014  . Thoracic ascending aortic aneurysm ([place])   . Hypertension   . Arthritis   . Diabetes ([place])   . Diabetes mellitus ([place]) 09/06/2012  . HTN (hypertension) 09/06/2012    Past Medical History  Diagnosis Date  . Hypertension   . Type 2 diabetes mellitus ([place])   . History of Bell's palsy     2003  RIGHT SIDE-  resolved  . Thoracic ascending aortic aneurysm ([place])     4.1 CM  STABLE SINCE 2004 PER DR VANTRIGHT NOTE 02/2013  . Diverticulosis   . 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 De Quervain's tenosynovitis, left     WRIST  . Diabetic retinopathy, nonproliferative ([place])   . DJD (degenerative joint disease)     left knee  . DDD (degenerative disc disease), cervical   . Cervical spondylosis   . TMJ (temporomandibular joint syndrome)   . Wears glasses   . Heart murmur     told a last physical a slight heart murmurper patient  . Thoracic aortic aneurysm [facility])     followed by Dr Tharon Aquas Trigt- LOV- 02/2013 in EPIC      Past Surgical History  Procedure Laterality Date  . Colonoscopy N/A 04/14/2013    Procedure: COLONOSCOPY;  Surgeon: Garlan Fair, MD;  Location: [facility];  Service: Endoscopy;  Laterality: N/A;  . Knee arthroscopy Bilateral RIGHT 2002 & 1994/   LEFT  1995  . 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 Rotator cuff repair Right 08-31-2008  . Laparoscopy assisted  right colectomy  03-22-2005    CECUM POLYP  . Abdominal hysterectomy  1995    W/ UNILATERAL SALPINGOOPHORECTOMY  . Dorsal compartment release Left 09/24/2013    Procedure: LEFT WRIST FIRST DORSAL COMPARTMENT TENOSYNOVITIS;  Surgeon: Linna Hoff, MD;  Location: [facility];  Service: Orthopedics;  Laterality: Left;  ANESTHESIA: LOCAL/IV SEDATION  . Total knee arthroplasty Left 08/31/2014    Procedure: LEFT TOTAL KNEE ARTHROPLASTY;  Surgeon: Sydnee Cabal, MD;  Location: [facility];  Service: Orthopedics;  Laterality: Left;  . Joint replacement      Social History  Substance Use Topics  . Smoking status: Former Smoker -- 2 years    Types: Cigarettes    Quit date: 08/21/1959  . Smokeless tobacco: Never Used  . Alcohol Use: No    Family History  Problem Relation Age of Onset  . Cancer Mother   . Diabetes Father     No Known Allergies  Medication list has been reviewed and updated.  Current Outpatient Prescriptions on File Prior to Visit  Medication Sig Dispense Refill  . amLODipine (NORVASC) 10 MG tablet Take 10 mg by mouth every morning.     Marland Kitchen aspirin EC 325 MG tablet Take 1 tablet (325 mg total) by mouth 2 (two) times daily. 60 tablet 0  . benzonatate (TESSALON) 100 MG capsule Take 1-2 capsules (100-200 mg total) by mouth 3 (three) times daily as needed for cough. 40 capsule 0  . cetirizine (ZYRTEC) 10 MG tablet Take 10 mg by mouth daily.    . ferrous sulfate 325 (65 FE) MG tablet Take 1 tablet (325 mg total) by mouth 3 (three) times daily after meals. 90 tablet 3  .  losartan-hydrochlorothiazide (HYZAAR) 100-25 MG per tablet Take 1 tablet by mouth daily.    . metFORMIN (GLUCOPHAGE) 1000 MG tablet Take 1,000 mg by mouth 2 (two) times daily with a meal.    . Multiple Vitamins-Minerals (MULTIVITAMIN WITH MINERALS) tablet Take 1 tablet by mouth daily.    . potassium chloride (K-DUR) 10 MEQ tablet Take 10 mEq by mouth 2 (two) times daily.    . cyclobenzaprine (FLEXERIL) 5 MG tablet Take 1 tablet (5 mg total) by mouth at bedtime. (Patient not taking: Reported on 08/16/2015) 30 tablet 1  . HYDROcodone-homatropine (HYCODAN) 5-1.5 MG/5ML syrup Take 5 mLs by mouth at bedtime as needed. (Patient not taking: Reported on 08/16/2015) 120 mL 0   No current facility-administered medications on file prior to visit.    ROS   Physical Examination: BP 138/80 mmHg  Pulse 88  Temp(Src) 98.3 F (36.8 C) (Oral)  Resp 16  Ht 5' 2.5" (1.588 m)  Wt 135 lb 12.8 oz (61.598 kg)  BMI 24.43 kg/m2  SpO2 96% Ideal Body Weight: Weight in (lb) to have BMI = 25: 138.6  Physical Exam  Constitutional: She is oriented to person, place, and time. She appears well-developed and well-nourished. No distress.  HENT:  Head: Normocephalic and atraumatic.  Right Ear: Tympanic membrane, external ear and ear canal normal.  Left Ear: Tympanic membrane, external ear and ear canal normal.  Nose: Mucosal edema and rhinorrhea present. Right sinus exhibits no maxillary sinus tenderness and no frontal sinus tenderness. Left sinus exhibits no maxillary sinus tenderness and no frontal sinus tenderness.  Mouth/Throat: No uvula swelling. No oropharyngeal exudate, posterior oropharyngeal edema or posterior oropharyngeal erythema.  Eyes: Conjunctivae and EOM are normal. Pupils are equal, round, and reactive to light.  Cardiovascular: Normal rate and regular rhythm.  Exam reveals no gallop, no distant heart sounds and no friction rub.   No murmur heard. Pulmonary/Chest: Effort normal. No respiratory  distress. She has no decreased breath sounds. She has no wheezes. She has no rhonchi.  Lymphadenopathy:       Head (right side): No submandibular, no tonsillar, no preauricular and no posterior auricular adenopathy present.       Head (left side): No submandibular, no tonsillar, no preauricular and no posterior auricular adenopathy present.  Neurological: She is alert and oriented to person, place, and time.  Skin: She is not diaphoretic.  Psychiatric: She has a normal mood and affect. Her behavior is normal.     Assessment and Plan: Savannah Lewis is a 74 y.o. female who is here today for follow up of continued cough and sore throat  for the last 7 days.  Treating for bacterial etiology at this time.  Starting mucinex.  She will pick up the hycodan at this time.  Advised to discontinue taking the flexeril.   She will return if she has no improvement in the next week.  Acute bronchitis, unspecified organism - Plan: azithromycin (ZITHROMAX) 250 MG tablet, Guaifenesin (MUCINEX MAXIMUM STRENGTH) 1200 MG TB12, Care order/instruction   Ivar Drape, PA-C [facility] 08/16/2015 8:45 AM

## 2015-08-16 NOTE — Patient Instructions (Signed)
Please pick up the hycodan at this time. Take the azithromycin as prescribed. I want you to hydrate very well with 50 oz of water per day.  This will make the mucinex work better for you. Acute Bronchitis Bronchitis is inflammation of the airways that extend from the windpipe into the lungs (bronchi). The inflammation often causes mucus to develop. This leads to a cough, which is the most common symptom of bronchitis.  In acute bronchitis, the condition usually develops suddenly and goes away over time, usually in a couple weeks. Smoking, allergies, and asthma can make bronchitis worse. Repeated episodes of bronchitis may cause further lung problems.  CAUSES Acute bronchitis is most often caused by the same virus that causes a cold. The virus can spread from person to person (contagious) through coughing, sneezing, and touching contaminated objects. SIGNS AND SYMPTOMS   Cough.   Fever.   Coughing up mucus.   Body aches.   Chest congestion.   Chills.   Shortness of breath.   Sore throat.  DIAGNOSIS  Acute bronchitis is usually diagnosed through a physical exam. Your health care provider will also ask you questions about your medical history. Tests, such as chest X-rays, are sometimes done to rule out other conditions.  TREATMENT  Acute bronchitis usually goes away in a couple weeks. Oftentimes, no medical treatment is necessary. Medicines are sometimes given for relief of fever or cough. Antibiotic medicines are usually not needed but may be prescribed in certain situations. In some cases, an inhaler may be recommended to help reduce shortness of breath and control the cough. A cool mist vaporizer may also be used to help thin bronchial secretions and make it easier to clear the chest.  HOME CARE INSTRUCTIONS  Get plenty of rest.   Drink enough fluids to keep your urine clear or pale yellow (unless you have a medical condition that requires fluid restriction). Increasing  fluids may help thin your respiratory secretions (sputum) and reduce chest congestion, and it will prevent dehydration.   Take medicines only as directed by your health care provider.  If you were prescribed an antibiotic medicine, finish it all even if you start to feel better.  Avoid smoking and secondhand smoke. Exposure to cigarette smoke or irritating chemicals will make bronchitis worse. If you are a smoker, consider using nicotine gum or skin patches to help control withdrawal symptoms. Quitting smoking will help your lungs heal faster.   Reduce the chances of another bout of acute bronchitis by washing your hands frequently, avoiding people with cold symptoms, and trying not to touch your hands to your mouth, nose, or eyes.   Keep all follow-up visits as directed by your health care provider.  SEEK MEDICAL CARE IF: Your symptoms do not improve after 1 week of treatment.  SEEK IMMEDIATE MEDICAL CARE IF:  You develop an increased fever or chills.   You have chest pain.   You have severe shortness of breath.  You have bloody sputum.   You develop dehydration.  You faint or repeatedly feel like you are going to pass out.  You develop repeated vomiting.  You develop a severe headache. MAKE SURE YOU:   Understand these instructions.  Will watch your condition.  Will get help right away if you are not doing well or get worse.   This information is not intended to replace advice given to you by your health care provider. Make sure you discuss any questions you have with your health care provider.  Document Released: 09/13/2004 Document Revised: 08/27/2014 Document Reviewed: 01/27/2013 Elsevier Interactive Patient Education [employer].

## 2015-10-20 DIAGNOSIS — Z471 Aftercare following joint replacement surgery: Secondary | ICD-10-CM | POA: Diagnosis not present

## 2015-10-20 DIAGNOSIS — Z96652 Presence of left artificial knee joint: Secondary | ICD-10-CM | POA: Diagnosis not present

## 2015-11-07 DIAGNOSIS — H524 Presbyopia: Secondary | ICD-10-CM | POA: Diagnosis not present

## 2015-11-11 ENCOUNTER — Encounter (HOSPITAL_COMMUNITY): Payer: Self-pay

## 2015-11-11 ENCOUNTER — Emergency Department (HOSPITAL_COMMUNITY): Payer: PRIVATE HEALTH INSURANCE

## 2015-11-11 ENCOUNTER — Emergency Department (HOSPITAL_COMMUNITY)
Admission: EM | Admit: 2015-11-11 | Discharge: 2015-11-11 | Disposition: A | Discharge status: Home / Self Care | Payer: PRIVATE HEALTH INSURANCE | Attending: Emergency Medicine | Admitting: Emergency Medicine

## 2015-11-11 DIAGNOSIS — E11319 Type 2 diabetes mellitus with unspecified diabetic retinopathy without macular edema: Secondary | ICD-10-CM | POA: Insufficient documentation

## 2015-11-11 DIAGNOSIS — Z86010 Personal history of colonic polyps: Secondary | ICD-10-CM | POA: Insufficient documentation

## 2015-11-11 DIAGNOSIS — Z8719 Personal history of other diseases of the digestive system: Secondary | ICD-10-CM | POA: Diagnosis not present

## 2015-11-11 DIAGNOSIS — Z7984 Long term (current) use of oral hypoglycemic drugs: Secondary | ICD-10-CM | POA: Diagnosis not present

## 2015-11-11 DIAGNOSIS — S43402A Unspecified sprain of left shoulder joint, initial encounter: Secondary | ICD-10-CM | POA: Diagnosis not present

## 2015-11-11 DIAGNOSIS — Z87891 Personal history of nicotine dependence: Secondary | ICD-10-CM | POA: Diagnosis not present

## 2015-11-11 DIAGNOSIS — Z8669 Personal history of other diseases of the nervous system and sense organs: Secondary | ICD-10-CM | POA: Insufficient documentation

## 2015-11-11 DIAGNOSIS — W19XXXA Unspecified fall, initial encounter: Secondary | ICD-10-CM

## 2015-11-11 DIAGNOSIS — W010XXA Fall on same level from slipping, tripping and stumbling without subsequent striking against object, initial encounter: Secondary | ICD-10-CM | POA: Insufficient documentation

## 2015-11-11 DIAGNOSIS — Y92000 Kitchen of unspecified non-institutional (private) residence as  the place of occurrence of the external cause: Secondary | ICD-10-CM | POA: Diagnosis not present

## 2015-11-11 DIAGNOSIS — Y9389 Activity, other specified: Secondary | ICD-10-CM | POA: Insufficient documentation

## 2015-11-11 DIAGNOSIS — Z7982 Long term (current) use of aspirin: Secondary | ICD-10-CM | POA: Insufficient documentation

## 2015-11-11 DIAGNOSIS — T148XXA Other injury of unspecified body region, initial encounter: Secondary | ICD-10-CM

## 2015-11-11 DIAGNOSIS — M1990 Unspecified osteoarthritis, unspecified site: Secondary | ICD-10-CM | POA: Insufficient documentation

## 2015-11-11 DIAGNOSIS — Y990 Civilian activity done for income or pay: Secondary | ICD-10-CM | POA: Insufficient documentation

## 2015-11-11 DIAGNOSIS — I10 Essential (primary) hypertension: Secondary | ICD-10-CM | POA: Diagnosis not present

## 2015-11-11 DIAGNOSIS — R011 Cardiac murmur, unspecified: Secondary | ICD-10-CM | POA: Diagnosis not present

## 2015-11-11 DIAGNOSIS — S4992XA Unspecified injury of left shoulder and upper arm, initial encounter: Secondary | ICD-10-CM | POA: Diagnosis present

## 2015-11-11 DIAGNOSIS — Z79899 Other long term (current) drug therapy: Secondary | ICD-10-CM | POA: Insufficient documentation

## 2015-11-11 NOTE — Discharge Instructions (Signed)
Joint Pain Joint pain, which is also called arthralgia, can be caused by many things. Joint pain often goes away when you follow your health care provider's instructions for relieving pain at home. However, joint pain can also be caused by conditions that require further treatment. Common causes of joint pain include:  Bruising in the area of the joint.  Overuse of the joint.  Wear and tear on the joints that occur with aging (osteoarthritis).  Various other forms of arthritis.  A buildup of a crystal form of uric acid in the joint (gout).  Infections of the joint (septic arthritis) or of the bone (osteomyelitis). Your health care provider may recommend medicine to help with the pain. If your joint pain continues, additional tests may be needed to diagnose your condition. HOME CARE INSTRUCTIONS Watch your condition for any changes. Follow these instructions as directed to lessen the pain that you are feeling.  Take medicines only as directed by your health care provider.  Rest the affected area for as long as your health care provider says that you should. If directed to do so, raise the painful joint above the level of your heart while you are sitting or lying down.  Do not do things that cause or worsen pain.  If directed, apply ice to the painful area:  Put ice in a plastic bag.  Place a towel between your skin and the bag.  Leave the ice on for 20 minutes, 2-3 times per day.  Wear an elastic bandage, splint, or sling as directed by your health care provider. Loosen the elastic bandage or splint if your fingers or toes become numb and tingle, or if they turn cold and blue.  Begin exercising or stretching the affected area as directed by your health care provider. Ask your health care provider what types of exercise are safe for you.  Keep all follow-up visits as directed by your health care provider. This is important. SEEK MEDICAL CARE IF:  Your pain increases, and medicine  does not help.  Your joint pain does not improve within 3 days.  You have increased bruising or swelling.  You have a fever.  You lose 10 lb (4.5 kg) or more without trying. SEEK IMMEDIATE MEDICAL CARE IF:  You are not able to move the joint.  Your fingers or toes become numb or they turn cold and blue.   This information is not intended to replace advice given to you by your health care provider. Make sure you discuss any questions you have with your health care provider.   Document Released: 08/06/2005 Document Revised: 08/27/2014 Document Reviewed: 05/18/2014 Elsevier Interactive Patient Education 2016 Elsevier Inc. Wrist Pain There are many things that can cause wrist pain. Some common causes include:  An injury to the wrist area, such as a sprain, strain, or fracture.  Overuse of the joint.  A condition that causes increased pressure on a nerve in the wrist (carpal tunnel syndrome).  Wear and tear of the joints that occurs with aging (osteoarthritis).  A variety of other types of arthritis. Sometimes, the cause of wrist pain is not known. The pain often goes away when you follow your health care provider's instructions for relieving pain at home. If your wrist pain continues, tests may need to be done to diagnose your condition. HOME CARE INSTRUCTIONS Pay attention to any changes in your symptoms. Take these actions to help with your pain:  Rest the wrist area for at least 48 hours or  as told by your health care provider.  If directed, apply ice to the injured area:  Put ice in a plastic bag.  Place a towel between your skin and the bag.  Leave the ice on for 20 minutes, 2-3 times per day.  Keep your arm raised (elevated) above the level of your heart while you are sitting or lying down.  If a splint or elastic bandage has been applied, use it as told by your health care provider.  Remove the splint or bandage only as told by your health care provider.  Loosen  the splint or bandage if your fingers become numb or have a tingling feeling, or if they turn cold or blue.  Take over-the-counter and prescription medicines only as told by your health care provider.  Keep all follow-up visits as told by your health care provider. This is important. SEEK MEDICAL CARE IF:  Your pain is not helped by treatment.  Your pain gets worse. SEEK IMMEDIATE MEDICAL CARE IF:  Your fingers become swollen.  Your fingers turn white, very red, or cold and blue.  Your fingers are numb or have a tingling feeling.  You have difficulty moving your fingers.   This information is not intended to replace advice given to you by your health care provider. Make sure you discuss any questions you have with your health care provider.   Document Released: 05/16/2005 Document Revised: 04/27/2015 Document Reviewed: 12/22/2014 Elsevier Interactive Patient Education [employer].

## 2015-11-11 NOTE — ED Notes (Signed)
Pt discharged with wrist/forearm splint to left upper extremity.  Pt independent and ambulatory.

## 2015-11-11 NOTE — ED Notes (Signed)
Pa at bedside updating patient

## 2015-11-11 NOTE — ED Provider Notes (Signed)
CSN: [id number]     Arrival date & time 11/11/15  1545 History  By signing my name below, I, Hansel Feinstein, attest that this documentation has been prepared under the direction and in the presence of Montine Circle, PA-C. Electronically Signed: Hansel Feinstein, ED Scribe. 11/11/2015. 4:40 PM.    Chief Complaint  Patient presents with  . Fall   The history is provided by the patient. No language interpreter was used.   HPI Comments: Savannah Lewis is a 75 y.o. female who presents to the Emergency Department with a chief complaint of moderate left arm pain s/p mechanical fall that occurred just PTA at work. Pt states she tripped over a crate, landing on her left side. She denies LOC or head injury. Pt has been ambulatory without difficulty since falling. She is able to move her left extremity without difficulty. She notes that her pain is worsened with movement. Pt denies taking OTC medications or using ice to improve symptoms. She also denies numbness, additional injuries.    Past Medical History  Diagnosis Date  . Hypertension   . Type 2 diabetes mellitus ([place])   . History of Bell's palsy     2003  RIGHT SIDE-  resolved  . Thoracic ascending aortic aneurysm ([place])     4.1 CM  STABLE SINCE 2004 PER DR VANTRIGHT NOTE 02/2013  . Diverticulosis   . 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 De Quervain's tenosynovitis, left     WRIST  . Diabetic retinopathy, nonproliferative ([place])   . DJD (degenerative joint disease)     left knee  . DDD (degenerative disc disease), cervical   . Cervical spondylosis   . TMJ (temporomandibular joint syndrome)   . Wears glasses   . Heart murmur     told a last physical a slight heart murmurper patient  . Thoracic aortic aneurysm [facility])     followed by Dr Tharon Aquas Trigt- LOV- 02/2013 in [facility]    Past Surgical History  Procedure Laterality Date  . Colonoscopy N/A 04/14/2013    Procedure: COLONOSCOPY;   Surgeon: Garlan Fair, MD;  [facility];  Service: Endoscopy;  Laterality: N/A;  . Knee arthroscopy Bilateral RIGHT 2002 & 1994/   LEFT  1995  . 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 Rotator cuff repair Right 08-31-2008  . Laparoscopy assisted  right colectomy  03-22-2005    CECUM POLYP  . Abdominal hysterectomy  1995    W/ UNILATERAL SALPINGOOPHORECTOMY  . Dorsal compartment release Left 09/24/2013    Procedure: LEFT WRIST FIRST DORSAL COMPARTMENT TENOSYNOVITIS;  Surgeon: Linna Hoff, MD;  Location: [facility];  Service: Orthopedics;  Laterality: Left;  ANESTHESIA: LOCAL/IV SEDATION  . Total knee arthroplasty Left 08/31/2014    Procedure: LEFT TOTAL KNEE ARTHROPLASTY;  Surgeon: Sydnee Cabal, MD;  Location: [facility];  Service: Orthopedics;  Laterality: Left;  . Joint replacement     Family History  Problem Relation Age of Onset  . Cancer Mother   . Diabetes Father    Social History  Substance Use Topics  . Smoking status: Former Smoker -- 2 years    Types: Cigarettes    Quit date: 08/21/1959  . Smokeless tobacco: Never Used  . Alcohol Use: No   OB History    No data available     Review of Systems  Constitutional: Negative for fever and chills.  Respiratory: Negative for shortness of breath.   Cardiovascular: Negative for chest pain.  Gastrointestinal: Negative for nausea, vomiting, diarrhea and constipation.  Genitourinary: Negative for dysuria.  Musculoskeletal: Positive for arthralgias.  Neurological: Negative for syncope and numbness.   Allergies  Review of patient's allergies indicates no known allergies.  Home Medications   Prior to Admission medications   Medication Sig Start Date End Date Taking? Authorizing Provider  amLODipine (NORVASC) 10 MG tablet Take 10 mg by mouth every morning.     Historical Provider, MD  aspirin EC 325 MG tablet Take 1 tablet (325 mg total) by mouth 2 (two) times daily. 09/03/14   Bryson L Stilwell,  PA-C  azithromycin (ZITHROMAX) 250 MG tablet Take 2 tabs PO x 1 dose, then 1 tab PO QD x 4 days 08/16/15   Dorian Heckle English, PA  benzonatate (TESSALON) 100 MG capsule Take 1-2 capsules (100-200 mg total) by mouth 3 (three) times daily as needed for cough. 08/09/15   Jaynee Eagles, PA-C  cetirizine (ZYRTEC) 10 MG tablet Take 10 mg by mouth daily.    Historical Provider, MD  cyclobenzaprine (FLEXERIL) 5 MG tablet Take 1 tablet (5 mg total) by mouth at bedtime. Patient not taking: Reported on 08/16/2015 08/09/15   Jaynee Eagles, PA-C  ferrous sulfate 325 (65 FE) MG tablet Take 1 tablet (325 mg total) by mouth 3 (three) times daily after meals. 09/03/14   Bryson L Stilwell, PA-C  Guaifenesin (MUCINEX MAXIMUM STRENGTH) 1200 MG TB12 Take 1 tablet (1,200 mg total) by mouth every 12 (twelve) hours as needed. 08/16/15   Dorian Heckle English, PA  HYDROcodone-homatropine (HYCODAN) 5-1.5 MG/5ML syrup Take 5 mLs by mouth at bedtime as needed. Patient not taking: Reported on 08/16/2015 08/09/15   Jaynee Eagles, PA-C  losartan-hydrochlorothiazide (HYZAAR) 100-25 MG per tablet Take 1 tablet by mouth daily.    Historical Provider, MD  metFORMIN (GLUCOPHAGE) 1000 MG tablet Take 1,000 mg by mouth 2 (two) times daily with a meal.    Historical Provider, MD  Multiple Vitamins-Minerals (MULTIVITAMIN WITH MINERALS) tablet Take 1 tablet by mouth daily.    Historical Provider, MD  potassium chloride (K-DUR) 10 MEQ tablet Take 10 mEq by mouth 2 (two) times daily.    Historical Provider, MD   BP 142/87 mmHg  Pulse 103  Temp(Src) 98.5 F (36.9 C) (Oral)  Resp 16  SpO2 99% Physical Exam Physical Exam  Constitutional: Pt appears well-developed and well-nourished. No distress.  HENT:  Head: Normocephalic and atraumatic.  Eyes: Conjunctivae are normal.  Neck: Normal range of motion.  Cardiovascular: Normal rate, regular rhythm and intact distal pulses.   Capillary refill < 3 sec  Pulmonary/Chest: Effort normal and breath  sounds normal.  Musculoskeletal: Pt exhibits mild tenderness throughout LUE, no focal tenderness. Pt exhibits no edema.  ROM: 4/5 limited by pain  Neurological: Pt  is alert. Coordination normal.  Sensation 5/5 Strength 4/5 limited by pain  Skin: Skin is warm and dry. Pt is not diaphoretic.  No tenting of the skin  Psychiatric: Pt has a normal mood and affect.  Nursing note and vitals reviewed.   ED Course  Procedures (including critical care time) DIAGNOSTIC STUDIES: Oxygen Saturation is 99% on RA, normal by my interpretation.    COORDINATION OF CARE: 4:37 PM Discussed treatment plan with pt at bedside which includes XR and pt agreed to plan.   Imaging Review Dg Forearm Left  11/11/2015  CLINICAL DATA:  Pain following fall EXAM: LEFT FOREARM -  2 VIEW COMPARISON:  None. FINDINGS: Frontal and lateral views were obtained. There is no fracture or dislocation. The joint spaces appear normal. No erosive change. No abnormal periosteal reaction. IMPRESSION: No demonstrable fracture or dislocation. No appreciable arthropathy. Electronically Signed   By: Lowella Grip III M.D.   On: 11/11/2015 16:36   Dg Shoulder Left  11/11/2015  CLINICAL DATA:  Fall, left shoulder and arm pain. EXAM: LEFT SHOULDER - 2+ VIEW COMPARISON:  11/11/2015 FINDINGS: Bones are osteopenic. No acute osseous finding, malalignment or fracture. Minor degenerative change of the [facility] joint. IMPRESSION: Osteopenia.  No acute osseous finding. Electronically Signed   By: Jerilynn Mages.  Shick M.D.   On: 11/11/2015 16:38   Dg Humerus Left  11/11/2015  CLINICAL DATA:  Pt tripped over crate in kitchen at work today, pain from left shoulder to left hand EXAM: LEFT HUMERUS - 2+ VIEW COMPARISON:  None. FINDINGS: There is no evidence of fracture or other focal bone lesions. Soft tissues are unremarkable. IMPRESSION: Negative. Electronically Signed   By: Skipper Cliche M.D.   On: 11/11/2015 16:39   Dg Hand Complete Left  11/11/2015  CLINICAL  DATA:  Tripped over a crate in the kitchen at work today, injury, left wrist pain, left hand pain EXAM: LEFT HAND - COMPLETE 3+ VIEW COMPARISON:  None FINDINGS: Three views of the left hand submitted. No acute fracture or subluxation. No radiopaque foreign body. Mild narrowing of radiocarpal joint space. Mild degenerative changes first carpometacarpal joint. IMPRESSION: No acute fracture or subluxation.  Mild degenerative changes. Electronically Signed   By: Lahoma Crocker M.D.   On: 11/11/2015 16:37   I have personally reviewed and evaluated these images as part of my medical decision-making.   MDM   Final diagnoses:  Fall, initial encounter  Sprain   Patient with mechanical fall today after tripping on a cart at work.  Complains of left arm pain.  No deformities.  Plain films are negative.  DC to home with wrist splint and PCP follow-up.   I personally performed the services described in this documentation, which was scribed in my presence. The recorded information has been reviewed and is accurate.     Montine Circle, PA-C 11/11/15 [place], MD 11/11/15 [phone]

## 2015-11-11 NOTE — ED Notes (Signed)
Pt was working downstairs in [employer] and she tripped over a crate. Denies LOC or head injury. She reports left shoulder, arm and hand pain.

## 2015-12-01 DIAGNOSIS — D649 Anemia, unspecified: Secondary | ICD-10-CM | POA: Diagnosis not present

## 2015-12-01 DIAGNOSIS — E119 Type 2 diabetes mellitus without complications: Secondary | ICD-10-CM | POA: Diagnosis not present

## 2015-12-01 DIAGNOSIS — D72819 Decreased white blood cell count, unspecified: Secondary | ICD-10-CM | POA: Diagnosis not present

## 2015-12-01 DIAGNOSIS — Z7984 Long term (current) use of oral hypoglycemic drugs: Secondary | ICD-10-CM | POA: Diagnosis not present

## 2015-12-01 DIAGNOSIS — I10 Essential (primary) hypertension: Secondary | ICD-10-CM | POA: Diagnosis not present

## 2016-02-24 DIAGNOSIS — R200 Anesthesia of skin: Secondary | ICD-10-CM | POA: Diagnosis not present

## 2016-02-24 DIAGNOSIS — R202 Paresthesia of skin: Secondary | ICD-10-CM | POA: Diagnosis not present

## 2016-03-06 DIAGNOSIS — G5602 Carpal tunnel syndrome, left upper limb: Secondary | ICD-10-CM | POA: Diagnosis not present

## 2016-03-06 DIAGNOSIS — M654 Radial styloid tenosynovitis [de Quervain]: Secondary | ICD-10-CM | POA: Diagnosis not present

## 2016-03-19 DIAGNOSIS — G5602 Carpal tunnel syndrome, left upper limb: Secondary | ICD-10-CM | POA: Diagnosis not present

## 2016-03-26 DIAGNOSIS — G5602 Carpal tunnel syndrome, left upper limb: Secondary | ICD-10-CM | POA: Diagnosis not present

## 2016-03-26 DIAGNOSIS — M654 Radial styloid tenosynovitis [de Quervain]: Secondary | ICD-10-CM | POA: Diagnosis not present

## 2016-04-04 DIAGNOSIS — M25511 Pain in right shoulder: Secondary | ICD-10-CM | POA: Diagnosis not present

## 2016-04-06 DIAGNOSIS — I10 Essential (primary) hypertension: Secondary | ICD-10-CM | POA: Diagnosis not present

## 2016-04-06 DIAGNOSIS — Z1389 Encounter for screening for other disorder: Secondary | ICD-10-CM | POA: Diagnosis not present

## 2016-04-06 DIAGNOSIS — I712 Thoracic aortic aneurysm, without rupture: Secondary | ICD-10-CM | POA: Diagnosis not present

## 2016-04-06 DIAGNOSIS — E119 Type 2 diabetes mellitus without complications: Secondary | ICD-10-CM | POA: Diagnosis not present

## 2016-04-13 DIAGNOSIS — M25511 Pain in right shoulder: Secondary | ICD-10-CM | POA: Diagnosis not present

## 2016-04-19 DIAGNOSIS — S46012A Strain of muscle(s) and tendon(s) of the rotator cuff of left shoulder, initial encounter: Secondary | ICD-10-CM | POA: Diagnosis not present

## 2016-05-07 DIAGNOSIS — G5602 Carpal tunnel syndrome, left upper limb: Secondary | ICD-10-CM | POA: Diagnosis not present

## 2016-05-07 DIAGNOSIS — M654 Radial styloid tenosynovitis [de Quervain]: Secondary | ICD-10-CM | POA: Diagnosis not present

## 2016-06-04 DIAGNOSIS — Z803 Family history of malignant neoplasm of breast: Secondary | ICD-10-CM | POA: Diagnosis not present

## 2016-06-04 DIAGNOSIS — Z1231 Encounter for screening mammogram for malignant neoplasm of breast: Secondary | ICD-10-CM | POA: Diagnosis not present

## 2016-07-04 DIAGNOSIS — H0013 Chalazion right eye, unspecified eyelid: Secondary | ICD-10-CM | POA: Diagnosis not present

## 2016-07-27 DIAGNOSIS — B079 Viral wart, unspecified: Secondary | ICD-10-CM | POA: Diagnosis not present

## 2016-07-27 DIAGNOSIS — M79674 Pain in right toe(s): Secondary | ICD-10-CM | POA: Diagnosis not present

## 2016-08-04 ENCOUNTER — Ambulatory Visit (INDEPENDENT_AMBULATORY_CARE_PROVIDER_SITE_OTHER): Payer: 59 | Admitting: Family Medicine

## 2016-08-04 DIAGNOSIS — J209 Acute bronchitis, unspecified: Secondary | ICD-10-CM | POA: Diagnosis not present

## 2016-08-04 MED ORDER — AZITHROMYCIN 250 MG PO TABS: ORAL_TABLET | ORAL | 0 refills | Status: DC

## 2016-08-04 MED ORDER — HYDROCODONE-HOMATROPINE 5-1.5 MG/5ML PO SYRP: 5.0000 mL | ORAL_SOLUTION | Freq: Three times a day (TID) | ORAL | 0 refills | Status: DC | PRN

## 2016-08-04 NOTE — Patient Instructions (Addendum)
It was good to meet you today.  Take the azithromycin 2 pills today and then 1 pill daily after that until gone.  Take the cough syrup every 6-8 hours as needed for cough. This will also help you sleep at night.   IF you received an x-ray today, you will receive an invoice from [facility]. Please contact [facility] at [phone] with questions or concerns regarding your invoice.   IF you received labwork today, you will receive an invoice from [facility]. Please contact [facility] at [phone] with questions or concerns regarding your invoice.   Our billing staff will not be able to assist you with questions regarding bills from these companies.  You will be contacted with the lab results as soon as they are available. The fastest way to get your results is to activate your My Chart account. Instructions are located on the last page of this paperwork. If you have not heard from [place] regarding the results in 2 weeks, please contact this office.

## 2016-08-04 NOTE — Progress Notes (Signed)
   SUBJECTIVE: URI symptoms:  Savannah Lewis is a 75 y.o. female who complains of URI symptoms present for past 7 days.  Describes rhinorrhea, sinus congestion, increasing cough.  Hasn't been able to get relief with anything she's tried. 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  No fevers or chills. No nausea or vomiting.  Denies smoking cigarettes.  ROS as above.    PMH reviewed. Patient is a nonsmoker.   Medications reviewed.  Physical Exam:  BP 138/64 (BP Location: Right Arm, Patient Position: Sitting, Cuff Size: Normal)   Pulse 80   Temp 98.4 F (36.9 C) (Oral)   Resp 18   Ht 5\' 1"  (1.549 m)   Wt 130 lb 6.4 oz (59.1 kg)   SpO2 99%   BMI 24.64 kg/m  Gen:  Patient sitting on exam table, appears stated age in no acute distress Head: Normocephalic atraumatic Eyes: EOMI, PERRL, sclera and conjunctiva non-erythematous Ears:  Canals clear bilaterally.  TMs pearly gray bilaterally without erythema or bulging.   Nose:  Nasal turbinates grossly enlarged bilaterally. Some exudates noted. Tender to palpation of maxillary sinus  Mouth: Mucosa membranes moist. Tonsils +2, nonenlarged, non-erythematous. Neck: No cervical lymphadenopathy noted Heart:  RRR, no murmurs auscultated. Pulm:  Clear to auscultation bilaterally with good air movement.  No wheezes or rales noted.    Assessment and Plan:  1.  URI: Plan to treat due to length of symptoms and no improvement.   Will prescribe azithromycin x 5 days. Instructed patient to return in 1 week for checkup or sooner if worsening or no improvement.

## 2016-08-15 ENCOUNTER — Emergency Department (HOSPITAL_COMMUNITY)
Admission: EM | Admit: 2016-08-15 | Discharge: 2016-08-15 | Disposition: A | Discharge status: Home / Self Care | Payer: 59 | Attending: Emergency Medicine | Admitting: Emergency Medicine

## 2016-08-15 ENCOUNTER — Encounter (HOSPITAL_COMMUNITY): Payer: Self-pay

## 2016-08-15 DIAGNOSIS — Z7984 Long term (current) use of oral hypoglycemic drugs: Secondary | ICD-10-CM | POA: Diagnosis not present

## 2016-08-15 DIAGNOSIS — E119 Type 2 diabetes mellitus without complications: Secondary | ICD-10-CM | POA: Insufficient documentation

## 2016-08-15 DIAGNOSIS — Z96653 Presence of artificial knee joint, bilateral: Secondary | ICD-10-CM | POA: Insufficient documentation

## 2016-08-15 DIAGNOSIS — Z7982 Long term (current) use of aspirin: Secondary | ICD-10-CM | POA: Insufficient documentation

## 2016-08-15 DIAGNOSIS — I10 Essential (primary) hypertension: Secondary | ICD-10-CM | POA: Insufficient documentation

## 2016-08-15 DIAGNOSIS — M898X1 Other specified disorders of bone, shoulder: Secondary | ICD-10-CM

## 2016-08-15 DIAGNOSIS — M25512 Pain in left shoulder: Secondary | ICD-10-CM | POA: Insufficient documentation

## 2016-08-15 DIAGNOSIS — Z87891 Personal history of nicotine dependence: Secondary | ICD-10-CM | POA: Insufficient documentation

## 2016-08-15 MED ORDER — METHOCARBAMOL 500 MG PO TABS: 500.0000 mg | ORAL_TABLET | Freq: Two times a day (BID) | ORAL | 0 refills | Status: DC

## 2016-08-15 NOTE — ED Triage Notes (Signed)
Patient c/o left scapula area pain since yesterday. Patient has used heat and Ibuprofen this Am with no relief.

## 2016-08-15 NOTE — Discharge Instructions (Signed)
Continue to apply heat. Try gentile massage and stretching. Continue ibuprofen for pain. Take Robaxin as prescribed as needed for muscle spasms. Follow up with family doctor for recheck. Return if any chest pain, shortness of breath, fever, numbness or weakness in extremities.

## 2016-08-15 NOTE — ED Provider Notes (Signed)
[place] DEPT Provider Note   CSN: [id number] Arrival date & time: 08/15/16  1011   By signing my name below, I, Collene Leyden, attest that this documentation has been prepared under the direction and in the presence of Jeannett Senior, PA-C Electronically Signed: Collene Leyden, Scribe. 08/15/16. 11:50 AM.   History   Chief Complaint Chief Complaint  Patient presents with  . Shoulder Pain    HPI Comments: Savannah Lewis is a 75 y.o. female with a PMHx of DDD, HTN, and DM, who presents to the Emergency Department complaining of sudden-onset left posterior shoulder pain that began yesterday. Patient states she is unsure what she did to injure herself. States she kept switching beds last night due to discomfort. Patient reports using a heating pad and taking ibuprofen this morning with some relief in pain. States pain is better now. 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 She denies any neck pain, cough, shortness of breath, and chest pain. No pain radiating down her arm. No numbness or weakness in the arm.   The history is provided by the patient. No language interpreter was used.    Past Medical History:  Diagnosis Date  . Cervical spondylosis   . DDD (degenerative disc disease), cervical   . De Quervain's tenosynovitis, left    WRIST  . Diabetic retinopathy, nonproliferative ([place])   . Diverticulosis   . DJD (degenerative joint disease)    left knee  . Heart murmur    told a last physical a slight heart murmurper patient  . 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 History of Bell's palsy    2003  RIGHT SIDE-  resolved  . Hypertension   . Thoracic aortic aneurysm [facility])    followed by Dr Tharon Aquas Trigt- LOV- 02/2013 in [facility]   . Thoracic ascending aortic aneurysm ([place])    4.1 CM  STABLE SINCE 2004 PER DR VANTRIGHT NOTE 02/2013  . TMJ (temporomandibular joint syndrome)   . Type 2  diabetes mellitus ([place])   . Wears glasses     Patient Active Problem List   Diagnosis Date Noted  . 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 S/P knee replacement 08/31/2014  . Thoracic ascending aortic aneurysm ([place])   . Hypertension   . Arthritis   . Diabetes ([place])   . Diabetes mellitus ([place]) 09/06/2012  . HTN (hypertension) 09/06/2012    Past Surgical History:  Procedure Laterality Date  . ABDOMINAL HYSTERECTOMY  1995   W/ UNILATERAL SALPINGOOPHORECTOMY  . COLONOSCOPY N/A 04/14/2013   Procedure: COLONOSCOPY;  Surgeon: Garlan Fair, MD;  [facility];  Service: Endoscopy;  Laterality: N/A;  . DORSAL COMPARTMENT RELEASE Left 09/24/2013   Procedure: LEFT WRIST FIRST DORSAL COMPARTMENT TENOSYNOVITIS;  Surgeon: Linna Hoff, MD;  Location: [place];  Service: Orthopedics;  Laterality: Left;  ANESTHESIA: LOCAL/IV SEDATION  . JOINT REPLACEMENT    . KNEE ARTHROSCOPY Bilateral RIGHT 2002 & 1994/   LEFT  1995  . LAPAROSCOPY ASSISTED  RIGHT COLECTOMY  03-22-2005   CECUM POLYP  . ROTATOR CUFF REPAIR Right 08-31-2008  . TOTAL KNEE ARTHROPLASTY Right 07-01-2009  . TOTAL KNEE ARTHROPLASTY Left 08/31/2014   Procedure: LEFT TOTAL KNEE ARTHROPLASTY;  Surgeon: Sydnee Cabal, MD;  Location: [facility];  Service: Orthopedics;  Laterality: Left;    OB History    No data available  Home Medications    Prior to Admission medications   Medication Sig Start Date End Date Taking? Authorizing Provider  amLODipine (NORVASC) 10 MG tablet Take 10 mg by mouth every morning.     Historical Provider, MD  aspirin EC 325 MG tablet Take 1 tablet (325 mg total) by mouth 2 (two) times daily. 09/03/14   Bryson L Stilwell, PA-C  azithromycin (ZITHROMAX) 250 MG tablet Take 2 tabs PO x 1 dose, then 1 tab PO QD x 4 days 08/04/16   Alveda Reasons, MD  cetirizine (ZYRTEC) 10 MG tablet Take 10 mg by mouth daily.    Historical Provider, MD  ferrous sulfate 325 (65 FE) MG  tablet Take 1 tablet (325 mg total) by mouth 3 (three) times daily after meals. Patient not taking: Reported on 08/04/2016 09/03/14   Bryson L Stilwell, PA-C  Guaifenesin (MUCINEX MAXIMUM STRENGTH) 1200 MG TB12 Take 1 tablet (1,200 mg total) by mouth every 12 (twelve) hours as needed. 08/16/15   Dorian Heckle English, PA  HYDROcodone-homatropine (HYCODAN) 5-1.5 MG/5ML syrup Take 5 mLs by mouth every 8 (eight) hours as needed for cough. 08/04/16   Alveda Reasons, MD  losartan-hydrochlorothiazide (HYZAAR) 100-25 MG per tablet Take 1 tablet by mouth daily.    Historical Provider, MD  metFORMIN (GLUCOPHAGE) 1000 MG tablet Take 1,000 mg by mouth 2 (two) times daily with a meal.    Historical Provider, MD  Multiple Vitamins-Minerals (MULTIVITAMIN WITH MINERALS) tablet Take 1 tablet by mouth daily.    Historical Provider, MD  potassium chloride (K-DUR) 10 MEQ tablet Take 10 mEq by mouth 2 (two) times daily.    Historical Provider, MD    Family History Family History  Problem Relation Age of Onset  . Cancer Mother   . Diabetes Father     Social History Social History  Substance Use Topics  . Smoking status: Former Smoker    Years: 2.00    Types: Cigarettes    Quit date: 08/21/1959  . Smokeless tobacco: Never Used  . Alcohol use No     Allergies   Patient has no known allergies.   Review of Systems Review of Systems  Constitutional: Negative for chills and fever.  Respiratory: Negative for cough, chest tightness and shortness of breath.   Cardiovascular: Negative for chest pain.  Gastrointestinal: Negative for nausea.  Musculoskeletal: Positive for myalgias. Negative for neck pain.  Skin: Negative for rash.  All other systems reviewed and are negative.    Physical Exam Updated Vital Signs BP 159/78 (BP Location: Left Arm)   Pulse 88   Temp 97.8 F (36.6 C) (Oral)   Resp 16   Ht 5\' 1"  (1.549 m)   Wt 130 lb (59 kg)   SpO2 100%   BMI 24.56 kg/m   Physical Exam    Constitutional: She is oriented to person, place, and time. She appears well-developed and well-nourished. No distress.  HENT:  Head: Normocephalic.  Eyes: Conjunctivae are normal.  Neck: Neck supple.  Cardiovascular: Normal rate, regular rhythm and normal heart sounds.   Pulmonary/Chest: Effort normal and breath sounds normal. No respiratory distress. She has no wheezes. She has no rales.  Musculoskeletal: She exhibits no edema.  No cervical or thoracic spine tenderness. Tender to palpation over her left parascapular muscles. Specifically over the muscles just medial to the medial border of the scapula. pain with range of motion of the left shoulder both actively and passively. Distal radial pulses intact. Strength intact in left  arm.  Neurological: She is alert and oriented to person, place, and time.  Skin: Skin is warm and dry.  Psychiatric: She has a normal mood and affect. Her behavior is normal.  Nursing note and vitals reviewed.    ED Treatments / Results  DIAGNOSTIC STUDIES: Oxygen Saturation is 100% on RA, normal by my interpretation.    COORDINATION OF CARE: 11:55 AM Discussed treatment plan with pt at bedside and pt agreed to plan.  Labs (all labs ordered are listed, but only abnormal results are displayed) Labs Reviewed - No data to display  EKG  EKG Interpretation None       Radiology No results found.  Procedures Procedures (including critical care time)  Medications Ordered in ED Medications - No data to display   Initial Impression / Assessment and Plan / ED Course  I have reviewed the triage vital signs and the nursing notes.  Pertinent labs & imaging results that were available during my care of the patient were reviewed by me and considered in my medical decision making (see chart for details).  Clinical Course     Patient with muscular left periscapular pain, reproducible with palpation and movement of the arm. Denies any chest pain,  shortness of breath, coughing, fever, pain down her arm. Her vital signs are normal, she is not hypoxic, not tachycardic or tachypneic. Doubt pneumonia, PE, ACS, dissection. Will treat with a muscle relaxant, continue ibuprofen. We'll give her a note for work for today and tomorrow. Instructed to rest. Follow-up with family doctor. Return if worsening. Discussed with Dr. Lita Mains who agrees with the plan.   Vitals:   08/15/16 1104 08/15/16 1145  BP: 168/91 159/78  Pulse: 99 88  Resp: 14 16  Temp: 97.8 F (36.6 C)   TempSrc: Oral   SpO2: 100% 100%  Weight: 59 kg   Height: 5\' 1"  (1.549 m)      Final Clinical Impressions(s) / ED Diagnoses   Final diagnoses:  Periscapular pain    New Prescriptions Discharge Medication List as of 08/15/2016 12:10 PM    START taking these medications   Details  methocarbamol (ROBAXIN) 500 MG tablet Take 1 tablet (500 mg total) by mouth 2 (two) times daily., Starting Wed 08/15/2016, Print       I personally performed the services described in this documentation, which was scribed in my presence. The recorded information has been reviewed and is accurate.    Jeannett Senior, PA-C 08/15/16 1216    Julianne Rice, MD 08/15/16 [phone]

## 2016-08-15 NOTE — ED Notes (Signed)
Bed: [facility] Expected date:  Expected time:  Means of arrival:  Comments: 

## 2016-08-28 ENCOUNTER — Other Ambulatory Visit: Payer: Self-pay | Admitting: *Deleted

## 2016-08-28 DIAGNOSIS — I712 Thoracic aortic aneurysm, without rupture, unspecified: Secondary | ICD-10-CM

## 2016-08-31 ENCOUNTER — Other Ambulatory Visit: Payer: Self-pay | Admitting: Cardiothoracic Surgery

## 2016-08-31 DIAGNOSIS — I10 Essential (primary) hypertension: Secondary | ICD-10-CM | POA: Diagnosis not present

## 2016-08-31 DIAGNOSIS — Z7984 Long term (current) use of oral hypoglycemic drugs: Secondary | ICD-10-CM | POA: Diagnosis not present

## 2016-08-31 DIAGNOSIS — E119 Type 2 diabetes mellitus without complications: Secondary | ICD-10-CM | POA: Diagnosis not present

## 2016-09-05 ENCOUNTER — Ambulatory Visit: Payer: 59 | Admitting: Cardiothoracic Surgery

## 2016-09-05 ENCOUNTER — Other Ambulatory Visit: Payer: 59

## 2016-09-05 ENCOUNTER — Ambulatory Visit: Payer: Self-pay | Admitting: Cardiothoracic Surgery

## 2016-09-06 ENCOUNTER — Other Ambulatory Visit: Payer: Self-pay

## 2016-09-07 ENCOUNTER — Ambulatory Visit: Payer: 59 | Admitting: Cardiothoracic Surgery

## 2016-09-09 ENCOUNTER — Ambulatory Visit
Admission: RE | Admit: 2016-09-09 | Discharge: 2016-09-09 | Disposition: A | Discharge status: Home / Self Care | Payer: 59 | Source: Ambulatory Visit | Attending: Cardiothoracic Surgery | Admitting: Cardiothoracic Surgery

## 2016-09-09 DIAGNOSIS — I712 Thoracic aortic aneurysm, without rupture, unspecified: Secondary | ICD-10-CM

## 2016-09-09 MED ORDER — GADOBENATE DIMEGLUMINE 529 MG/ML IV SOLN
12.0000 mL | Freq: Once | INTRAVENOUS | Status: AC | PRN
  Administered 2016-09-09: 12 mL via INTRAVENOUS

## 2016-09-11 ENCOUNTER — Ambulatory Visit (INDEPENDENT_AMBULATORY_CARE_PROVIDER_SITE_OTHER): Payer: 59 | Admitting: Cardiothoracic Surgery

## 2016-09-11 VITALS — BP 120/64 | HR 84 | Resp 20 | Ht 61.0 in | Wt 128.0 lb

## 2016-09-11 DIAGNOSIS — I77810 Thoracic aortic ectasia: Secondary | ICD-10-CM | POA: Diagnosis not present

## 2016-09-11 NOTE — Progress Notes (Signed)
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 This is asymptomatic. She has hypertension which is well-controlled with losartan and amlodipine. 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 Her weight has been stable and she is actually lost some weight and she appears to be at a optimal weight now  The MRA shows minimal calcification of the ascending aorta and arch. It has not changed in diameter in almost 4 years.      Current Outpatient Prescriptions  Medication Sig Dispense Refill  . amLODipine (NORVASC) 10 MG tablet Take 10 mg by mouth every morning.     Marland Kitchen aspirin EC 325 MG tablet Take 1 tablet (325 mg total) by mouth 2 (two) times daily. 60 tablet 0  . cetirizine (ZYRTEC) 10 MG tablet Take 10 mg by mouth daily.    Marland Kitchen losartan-hydrochlorothiazide (HYZAAR) 100-25 MG per tablet Take 1 tablet by mouth daily.    . metFORMIN (GLUCOPHAGE) 1000 MG tablet Take 1,000 mg by mouth 2 (two) times daily with a meal.    . Multiple Vitamins-Minerals (MULTIVITAMIN WITH MINERALS) tablet Take 1 tablet by mouth daily.    . potassium chloride (K-DUR) 10 MEQ tablet Take 10 mEq by mouth 2 (two) times daily.    Marland Kitchen azithromycin (ZITHROMAX) 250 MG tablet Take 2 tabs PO x 1 dose, then 1 tab PO QD x 4 days (Patient not taking: Reported on 09/11/2016) 6 tablet 0  . ferrous sulfate 325 (65 FE) MG tablet Take 1 tablet (325 mg total) by mouth 3 (three) times daily after meals. (Patient not taking: Reported on 09/11/2016) 90 tablet 3  . Guaifenesin (MUCINEX MAXIMUM STRENGTH) 1200 MG TB12 Take 1 tablet (1,200 mg total) by mouth every 12 (twelve) hours as needed. (Patient not taking: Reported on 09/11/2016) 14 tablet 1  . HYDROcodone-homatropine (HYCODAN) 5-1.5 MG/5ML syrup Take 5 mLs by mouth every 8 (eight) hours as needed for cough. (Patient not taking: Reported on 09/11/2016)  120 mL 0  . methocarbamol (ROBAXIN) 500 MG tablet Take 1 tablet (500 mg total) by mouth 2 (two) times daily. (Patient not taking: Reported on 09/11/2016) 20 tablet 0   No current facility-administered medications for this visit.      Review of Systems: No chest pain Mild seasonal nasal congestion Weight stable No fever No ankle edema Status post bilateral knee replacement surgery which went well  Physical Exam Vitals:   09/11/16 1451  BP: 120/64  Pulse: 84  Resp: 20        Exam    General- alert and comfortable   Lungs- clear without rales, wheezes   Cor- regular rate and rhythm, no murmur , gallop   Abdomen- soft, non-tender   Extremities - warm, non-tender, minimal edema   Neuro- oriented, appropriate, no focal weakness   Diagnostic Tests: MRA shows minimal dilatation of the ascending thoracic aorta measuring at 3.9 centimeter diameter  Impression: Aortic thoracic disease with minimal risk for dissection. Patient is commended on excellent compliance with her antihypertensive medication and heart healthy lifestyle and regular exercise schedule Plan: Return for repeat scan in 18 months.   Len Childs, MD [facility] [phone]

## 2016-11-02 DIAGNOSIS — H0013 Chalazion right eye, unspecified eyelid: Secondary | ICD-10-CM | POA: Diagnosis not present

## 2016-11-05 DIAGNOSIS — E119 Type 2 diabetes mellitus without complications: Secondary | ICD-10-CM | POA: Diagnosis not present

## 2016-11-05 DIAGNOSIS — J0110 Acute frontal sinusitis, unspecified: Secondary | ICD-10-CM | POA: Diagnosis not present

## 2016-11-05 DIAGNOSIS — Z7984 Long term (current) use of oral hypoglycemic drugs: Secondary | ICD-10-CM | POA: Diagnosis not present

## 2016-12-26 DIAGNOSIS — L600 Ingrowing nail: Secondary | ICD-10-CM | POA: Diagnosis not present

## 2017-01-08 DIAGNOSIS — B351 Tinea unguium: Secondary | ICD-10-CM | POA: Diagnosis not present

## 2017-01-08 DIAGNOSIS — M2570 Osteophyte, unspecified joint: Secondary | ICD-10-CM | POA: Diagnosis not present

## 2017-01-10 ENCOUNTER — Ambulatory Visit (INDEPENDENT_AMBULATORY_CARE_PROVIDER_SITE_OTHER): Payer: 59 | Admitting: Family Medicine

## 2017-01-10 VITALS — BP 158/71 | HR 76 | Temp 98.7°F | Resp 16 | Ht 61.0 in | Wt 135.2 lb

## 2017-01-10 DIAGNOSIS — J301 Allergic rhinitis due to pollen: Secondary | ICD-10-CM | POA: Diagnosis not present

## 2017-01-10 DIAGNOSIS — J209 Acute bronchitis, unspecified: Secondary | ICD-10-CM | POA: Diagnosis not present

## 2017-01-10 MED ORDER — FLUTICASONE PROPIONATE 50 MCG/ACT NA SUSP: 2.0000 | Freq: Every day | NASAL | 6 refills | Status: DC

## 2017-01-10 MED ORDER — PREDNISONE 10 MG PO TABS: ORAL_TABLET | ORAL | 0 refills | Status: DC

## 2017-01-10 MED ORDER — AZITHROMYCIN 250 MG PO TABS: ORAL_TABLET | ORAL | 0 refills | Status: DC

## 2017-01-10 MED ORDER — CETIRIZINE HCL 10 MG PO TABS: 10.0000 mg | ORAL_TABLET | Freq: Every day | ORAL | 3 refills | Status: DC

## 2017-01-10 NOTE — Progress Notes (Signed)
   SUBJECTIVE: URI symptoms:  Savannah Lewis is a 76 y.o. female who complains of URI symptoms present for past several days.  Describes rhinorrhea, sore throat, sinus congestion, moderate and worsening cough.  Cough is mostly dry/hacking.  Started with some mucus production this AM.  Has tried prior bottle of Hycodan without much relief.  Sick contacts are none.  No fevers or chills. No nausea or vomiting.  Denies smoking cigarettes.    She is concerned because she states when she gets sick, it becomes bronchitis.   She states "my nose always runs this time of year."  Nasal drainage is clear.  Previously on cetirizine and flonase, but has been out of these for some time.    ROS as above.    PMH reviewed. Patient is a nonsmoker.   Medications reviewed.  Physical Exam:  BP (!) 158/71   Pulse 76   Temp 98.7 F (37.1 C) (Oral)   Resp 16   Ht 5\' 1"  (1.549 m)   Wt 135 lb 3.2 oz (61.3 kg)   SpO2 97%   BMI 25.55 kg/m  Gen:  Patient sitting on exam table, appears stated age in no acute distress Head: Normocephalic atraumatic Eyes: EOMI, PERRL, sclera and conjunctiva non-erythematous Ears:  Canals clear bilaterally.  TMs pearly gray bilaterally without erythema or bulging.   Nose:  Nasal turbinates boggy and edematous BL Mouth: Mucosa membranes moist. Tonsils +2, nonenlarged, non-erythematous. Neck: No cervical lymphadenopathy noted Heart:  RRR, no murmurs auscultated. Pulm:  Minimal scattered wheezing throughout   Assessment and Plan:  1.  Viral URI becoming bronchitis 2. Seasonal allergies: -Treating as such with prednisone taper - mild wheezing.  Denies any dyspnea so less need for albuterol - I have also sent in cetrizine and flonase refills for her.  - delayed antibiotic if no improvement 48 hours with prednisone.   - Instructed patient to return in 1 week for checkup or sooner if worsening or no improvement.

## 2017-01-10 NOTE — Patient Instructions (Addendum)
I think you have a cold which has triggered your cough.  You also are having issues with your allergies.   For the cough, take the prednisone as follows:  Take 6 pills today, 5 pills tomorrow, 4 pills today after, 3 pills after, 2 pills after, 1 pill last day.  This will help your cough.  Take the Cetirizine and Flonase nasal spray to help with your allergies and nasal drainage.    If you are still having issues in the next 48 hours, you can try the azithromycin.  Don't pick this up at first, only if you need it later in the week.    It was good to see you today!    IF you received an x-ray today, you will receive an invoice from [facility]. Please contact [facility] at [phone] with questions or concerns regarding your invoice.   IF you received labwork today, you will receive an invoice from [facility]. Please contact [facility] at [phone] with questions or concerns regarding your invoice.   Our billing staff will not be able to assist you with questions regarding bills from these companies.  You will be contacted with the lab results as soon as they are available. The fastest way to get your results is to activate your My Chart account. Instructions are located on the last page of this paperwork. If you have not heard from [place] regarding the results in 2 weeks, please contact this office.

## 2017-01-24 DIAGNOSIS — Z01419 Encounter for gynecological examination (general) (routine) without abnormal findings: Secondary | ICD-10-CM | POA: Diagnosis not present

## 2017-01-24 DIAGNOSIS — N907 Vulvar cyst: Secondary | ICD-10-CM | POA: Diagnosis not present

## 2017-02-05 DIAGNOSIS — B351 Tinea unguium: Secondary | ICD-10-CM | POA: Diagnosis not present

## 2017-02-05 DIAGNOSIS — M65872 Other synovitis and tenosynovitis, left ankle and foot: Secondary | ICD-10-CM | POA: Diagnosis not present

## 2017-02-05 DIAGNOSIS — M79674 Pain in right toe(s): Secondary | ICD-10-CM | POA: Diagnosis not present

## 2017-02-05 DIAGNOSIS — M79675 Pain in left toe(s): Secondary | ICD-10-CM | POA: Diagnosis not present

## 2017-02-05 DIAGNOSIS — M65871 Other synovitis and tenosynovitis, right ankle and foot: Secondary | ICD-10-CM | POA: Diagnosis not present

## 2017-02-05 DIAGNOSIS — D2371 Other benign neoplasm of skin of right lower limb, including hip: Secondary | ICD-10-CM | POA: Diagnosis not present

## 2017-03-12 DIAGNOSIS — B351 Tinea unguium: Secondary | ICD-10-CM | POA: Diagnosis not present

## 2017-03-12 DIAGNOSIS — D2371 Other benign neoplasm of skin of right lower limb, including hip: Secondary | ICD-10-CM | POA: Diagnosis not present

## 2017-03-12 DIAGNOSIS — M65871 Other synovitis and tenosynovitis, right ankle and foot: Secondary | ICD-10-CM | POA: Diagnosis not present

## 2017-04-09 DIAGNOSIS — M79674 Pain in right toe(s): Secondary | ICD-10-CM | POA: Diagnosis not present

## 2017-04-09 DIAGNOSIS — D2371 Other benign neoplasm of skin of right lower limb, including hip: Secondary | ICD-10-CM | POA: Diagnosis not present

## 2017-04-09 DIAGNOSIS — B351 Tinea unguium: Secondary | ICD-10-CM | POA: Diagnosis not present

## 2017-04-11 DIAGNOSIS — Z7984 Long term (current) use of oral hypoglycemic drugs: Secondary | ICD-10-CM | POA: Diagnosis not present

## 2017-04-11 DIAGNOSIS — E119 Type 2 diabetes mellitus without complications: Secondary | ICD-10-CM | POA: Diagnosis not present

## 2017-04-11 DIAGNOSIS — D649 Anemia, unspecified: Secondary | ICD-10-CM | POA: Diagnosis not present

## 2017-04-11 DIAGNOSIS — I10 Essential (primary) hypertension: Secondary | ICD-10-CM | POA: Diagnosis not present

## 2017-04-11 DIAGNOSIS — Z0000 Encounter for general adult medical examination without abnormal findings: Secondary | ICD-10-CM | POA: Diagnosis not present

## 2017-04-11 DIAGNOSIS — E1165 Type 2 diabetes mellitus with hyperglycemia: Secondary | ICD-10-CM | POA: Diagnosis not present

## 2017-04-15 DIAGNOSIS — M25512 Pain in left shoulder: Secondary | ICD-10-CM | POA: Diagnosis not present

## 2017-04-23 ENCOUNTER — Other Ambulatory Visit: Payer: Self-pay | Admitting: Physician Assistant

## 2017-04-23 DIAGNOSIS — M25512 Pain in left shoulder: Secondary | ICD-10-CM

## 2017-04-25 DIAGNOSIS — M65872 Other synovitis and tenosynovitis, left ankle and foot: Secondary | ICD-10-CM | POA: Diagnosis not present

## 2017-04-25 DIAGNOSIS — M65871 Other synovitis and tenosynovitis, right ankle and foot: Secondary | ICD-10-CM | POA: Diagnosis not present

## 2017-04-27 ENCOUNTER — Inpatient Hospital Stay
Admission: RE | Admit: 2017-04-27 | Discharge: 2017-04-27 | Disposition: A | Discharge status: Home / Self Care | Payer: Self-pay | Source: Ambulatory Visit | Attending: Physician Assistant | Admitting: Physician Assistant

## 2017-05-01 DIAGNOSIS — M25512 Pain in left shoulder: Secondary | ICD-10-CM | POA: Diagnosis not present

## 2017-05-09 DIAGNOSIS — M19012 Primary osteoarthritis, left shoulder: Secondary | ICD-10-CM | POA: Diagnosis not present

## 2017-05-09 DIAGNOSIS — M25312 Other instability, left shoulder: Secondary | ICD-10-CM | POA: Diagnosis not present

## 2017-06-06 DIAGNOSIS — Z803 Family history of malignant neoplasm of breast: Secondary | ICD-10-CM | POA: Diagnosis not present

## 2017-06-06 DIAGNOSIS — Z1231 Encounter for screening mammogram for malignant neoplasm of breast: Secondary | ICD-10-CM | POA: Diagnosis not present

## 2017-06-11 DIAGNOSIS — M25774 Osteophyte, right foot: Secondary | ICD-10-CM | POA: Diagnosis not present

## 2017-06-11 DIAGNOSIS — M25775 Osteophyte, left foot: Secondary | ICD-10-CM | POA: Diagnosis not present

## 2017-06-14 ENCOUNTER — Emergency Department (HOSPITAL_COMMUNITY): Payer: 59

## 2017-06-14 ENCOUNTER — Other Ambulatory Visit (HOSPITAL_COMMUNITY): Payer: Self-pay | Admitting: Radiology

## 2017-06-14 ENCOUNTER — Emergency Department (HOSPITAL_COMMUNITY)
Admission: EM | Admit: 2017-06-14 | Discharge: 2017-06-14 | Disposition: A | Discharge status: Home / Self Care | Payer: 59 | Attending: Emergency Medicine | Admitting: Emergency Medicine

## 2017-06-14 ENCOUNTER — Encounter (HOSPITAL_COMMUNITY): Payer: Self-pay | Admitting: *Deleted

## 2017-06-14 DIAGNOSIS — M25511 Pain in right shoulder: Secondary | ICD-10-CM | POA: Diagnosis not present

## 2017-06-14 DIAGNOSIS — Y929 Unspecified place or not applicable: Secondary | ICD-10-CM | POA: Diagnosis not present

## 2017-06-14 DIAGNOSIS — Z7982 Long term (current) use of aspirin: Secondary | ICD-10-CM | POA: Diagnosis not present

## 2017-06-14 DIAGNOSIS — S025XXA Fracture of tooth (traumatic), initial encounter for closed fracture: Secondary | ICD-10-CM

## 2017-06-14 DIAGNOSIS — Z96652 Presence of left artificial knee joint: Secondary | ICD-10-CM | POA: Diagnosis not present

## 2017-06-14 DIAGNOSIS — E119 Type 2 diabetes mellitus without complications: Secondary | ICD-10-CM | POA: Insufficient documentation

## 2017-06-14 DIAGNOSIS — S4991XA Unspecified injury of right shoulder and upper arm, initial encounter: Secondary | ICD-10-CM | POA: Diagnosis not present

## 2017-06-14 DIAGNOSIS — Z7984 Long term (current) use of oral hypoglycemic drugs: Secondary | ICD-10-CM | POA: Diagnosis not present

## 2017-06-14 DIAGNOSIS — I10 Essential (primary) hypertension: Secondary | ICD-10-CM | POA: Insufficient documentation

## 2017-06-14 DIAGNOSIS — S40011A Contusion of right shoulder, initial encounter: Secondary | ICD-10-CM | POA: Diagnosis not present

## 2017-06-14 DIAGNOSIS — S0990XA Unspecified injury of head, initial encounter: Secondary | ICD-10-CM | POA: Diagnosis not present

## 2017-06-14 DIAGNOSIS — S0993XA Unspecified injury of face, initial encounter: Secondary | ICD-10-CM | POA: Diagnosis not present

## 2017-06-14 DIAGNOSIS — Z87891 Personal history of nicotine dependence: Secondary | ICD-10-CM | POA: Diagnosis not present

## 2017-06-14 DIAGNOSIS — S199XXA Unspecified injury of neck, initial encounter: Secondary | ICD-10-CM | POA: Diagnosis not present

## 2017-06-14 DIAGNOSIS — W19XXXA Unspecified fall, initial encounter: Secondary | ICD-10-CM

## 2017-06-14 DIAGNOSIS — Y998 Other external cause status: Secondary | ICD-10-CM | POA: Insufficient documentation

## 2017-06-14 DIAGNOSIS — S00531A Contusion of lip, initial encounter: Secondary | ICD-10-CM

## 2017-06-14 DIAGNOSIS — R296 Repeated falls: Secondary | ICD-10-CM | POA: Diagnosis not present

## 2017-06-14 DIAGNOSIS — Z79899 Other long term (current) drug therapy: Secondary | ICD-10-CM | POA: Insufficient documentation

## 2017-06-14 DIAGNOSIS — Y939 Activity, unspecified: Secondary | ICD-10-CM | POA: Diagnosis not present

## 2017-06-14 NOTE — ED Triage Notes (Signed)
Pt states she fell yesterday afternoon, now complains of right facial pain, left knee pain, generalized pain all over. Pt has upper right chipped tooth. Pt denies loss of consciousness.

## 2017-06-14 NOTE — Discharge Instructions (Signed)
Apply ice to sore areas. Take ibuprofen, naproxen or acetaminophen as needed for pain. See your dentist regarding your chipped tooth.

## 2017-06-14 NOTE — ED Notes (Signed)
She remains in no distress.

## 2017-06-14 NOTE — ED Provider Notes (Signed)
[facility] DEPT Provider Note   CSN: [id number] Arrival date & time: 06/14/17  0741     History   Chief Complaint No chief complaint on file.   HPI Savannah Lewis is a 76 y.o. female.  The history is provided by the patient.  She Fell yesterday afternoon. She was coming out of the building and fell, she does not know how she fell. She landed on her right side and is complaining of pain in her right shoulder. She also chipped her right upper incisor. She denies loss of consciousness. She woke up this morning feeling more achy so she's decided to come in. She rates her pain at 8/10. She has been in the [employer] since the fall. She denies taking any anticoagulants.  Past Medical History:  Diagnosis Date  . Cervical spondylosis   . DDD (degenerative disc disease), cervical   . De Quervain's tenosynovitis, left    WRIST  . Diabetic retinopathy, nonproliferative ([place])   . Diverticulosis   . DJD (degenerative joint disease)    left knee  . Heart murmur    told a last physical a slight heart murmurper patient  . 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 History of Bell's palsy    2003  RIGHT SIDE-  resolved  . Hypertension   . Thoracic aortic aneurysm [facility])    followed by Dr Tharon Aquas Trigt- LOV- 02/2013 in [facility]   . Thoracic ascending aortic aneurysm ([place])    4.1 CM  STABLE SINCE 2004 PER DR VANTRIGHT NOTE 02/2013  . TMJ (temporomandibular joint syndrome)   . Type 2 diabetes mellitus ([place])   . Wears glasses     Patient Active Problem List   Diagnosis Date Noted  . 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 S/P knee replacement 08/31/2014  . Thoracic ascending aortic aneurysm ([place])   . Hypertension   . Arthritis   . Diabetes ([place])   . Diabetes mellitus ([place]) 09/06/2012  . HTN (hypertension) 09/06/2012    Past Surgical History:  Procedure Laterality Date  . ABDOMINAL  HYSTERECTOMY  1995   W/ UNILATERAL SALPINGOOPHORECTOMY  . COLONOSCOPY N/A 04/14/2013   Procedure: COLONOSCOPY;  Surgeon: Garlan Fair, MD;  Location: [facility];  Service: Endoscopy;  Laterality: N/A;  . DORSAL COMPARTMENT RELEASE Left 09/24/2013   Procedure: LEFT WRIST FIRST DORSAL COMPARTMENT TENOSYNOVITIS;  Surgeon: Linna Hoff, MD;  Location: [facility];  Service: Orthopedics;  Laterality: Left;  ANESTHESIA: LOCAL/IV SEDATION  . JOINT REPLACEMENT    . KNEE ARTHROSCOPY Bilateral RIGHT 2002 & 1994/   LEFT  1995  . LAPAROSCOPY ASSISTED  RIGHT COLECTOMY  03-22-2005   CECUM POLYP  . ROTATOR CUFF REPAIR Right 08-31-2008  . TOTAL KNEE ARTHROPLASTY Right 07-01-2009  . TOTAL KNEE ARTHROPLASTY Left 08/31/2014   Procedure: LEFT TOTAL KNEE ARTHROPLASTY;  Surgeon: Sydnee Cabal, MD;  Location: [facility];  Service: Orthopedics;  Laterality: Left;    OB History    No data available       Home Medications    Prior to Admission medications   Medication Sig Start Date End Date Taking? Authorizing Provider  amLODipine (NORVASC) 10 MG tablet Take 10 mg by mouth every morning.     [provider]  aspirin EC 325 MG tablet Take 1 tablet (325 mg total) by mouth 2 (two) times daily. 09/03/14   Wyatt Portela  L, PA-C  azithromycin (ZITHROMAX) 250 MG tablet Take 2 tabs PO x 1 dose, then 1 tab PO QD x 4 days 01/10/17   Alveda Reasons, MD  cetirizine (ZYRTEC) 10 MG tablet Take 1 tablet (10 mg total) by mouth daily. 01/10/17   Alveda Reasons, MD  ferrous sulfate 325 (65 FE) MG tablet Take 1 tablet (325 mg total) by mouth 3 (three) times daily after meals. Patient not taking: Reported on 09/11/2016 09/03/14   Wyatt Portela L, PA-C  fluticasone (FLONASE) 50 MCG/ACT nasal spray Place 2 sprays into both nostrils daily. 01/10/17   Alveda Reasons, MD  Guaifenesin [facility] MAXIMUM STRENGTH) 1200 MG TB12 Take 1 tablet (1,200 mg total) by mouth every 12 (twelve) hours as  needed. Patient not taking: Reported on 09/11/2016 08/16/15   Ivar Drape D, PA  HYDROcodone-homatropine [facility]) 5-1.5 MG/5ML syrup Take 5 mLs by mouth every 8 (eight) hours as needed for cough. Patient not taking: Reported on 09/11/2016 08/04/16   Alveda Reasons, MD  losartan-hydrochlorothiazide [facility]) 100-25 MG per tablet Take 1 tablet by mouth daily.    [provider]  metFORMIN (GLUCOPHAGE) 1000 MG tablet Take 1,000 mg by mouth 2 (two) times daily with a meal.    [provider]  methocarbamol (ROBAXIN) 500 MG tablet Take 1 tablet (500 mg total) by mouth 2 (two) times daily. Patient not taking: Reported on 09/11/2016 08/15/16   Jeannett Senior, PA-C  Multiple Vitamins-Minerals (MULTIVITAMIN WITH MINERALS) tablet Take 1 tablet by mouth daily.    [provider]  potassium chloride (K-DUR) 10 MEQ tablet Take 10 mEq by mouth 2 (two) times daily.    [provider]  predniSONE (DELTASONE) 10 MG tablet Take 6 pills today, 5 pills tomorrow, 4 pills today after, 3 pills after, 2 pills after, 1 pill last day 01/10/17   Alveda Reasons, MD    Family History Family History  Problem Relation Age of Onset  . Cancer Mother   . Diabetes Father     Social History Social History  Substance Use Topics  . Smoking status: Former Smoker    Years: 2.00    Types: Cigarettes    Quit date: 08/21/1959  . Smokeless tobacco: Never Used  . Alcohol use No     Allergies   Patient has no known allergies.   Review of Systems Review of Systems  All other systems reviewed and are negative.    Physical Exam Updated Vital Signs BP (!) 175/86 (BP Location: Right Arm)   Pulse 99   Temp 97.9 F (36.6 C) (Oral)   Resp 18   SpO2 100%   Physical Exam  Nursing note and vitals reviewed.  76 year old female, resting comfortably and in no acute distress. Vital signs are significant for hypertension. Oxygen saturation is 100%, which is normal. Head is  normocephalic and atraumatic. PERRLA, EOMI. Oropharynx is clear. Tooth #8 is chipped. Ecchymosis present upper lip without laceration. Neck is nontender without adenopathy or JVD. Back is nontender and there is no CVA tenderness. Lungs are clear without rales, wheezes, or rhonchi. Chest is nontender. Heart has regular rate and rhythm without murmur. Abdomen is soft, flat, nontender without masses or hepatosplenomegaly and peristalsis is normoactive. Extremities have no cyanosis or edema, full range of motion is present. Mild tenderness to palpation right shoulder without point tenderness. Skin is warm and dry without rash. Neurologic: Mental status is normal, cranial nerves are intact, there are no motor or  sensory deficits.  ED Treatments / Results   Radiology Dg Shoulder Right  Result Date: 06/14/2017 CLINICAL DATA:  [place].  Pain. EXAM: RIGHT SHOULDER - 2+ VIEW COMPARISON:  None. FINDINGS: There is no evidence of fracture or dislocation. There is no evidence of arthropathy or other focal bone abnormality. Soft tissues are unremarkable. IMPRESSION: Negative. Electronically Signed   By: Staci Righter M.D.   On: 06/14/2017 10:00   Ct Head Wo Contrast  Result Date: 06/14/2017 CLINICAL DATA:  Status post fall. EXAM: CT HEAD WITHOUT CONTRAST CT MAXILLOFACIAL WITHOUT CONTRAST CT CERVICAL SPINE WITHOUT CONTRAST TECHNIQUE: Multidetector CT imaging of the head, cervical spine, and maxillofacial structures were performed using the standard protocol without intravenous contrast. Multiplanar CT image reconstructions of the cervical spine and maxillofacial structures were also generated. COMPARISON:  None. FINDINGS: CT HEAD FINDINGS Brain: No evidence of acute infarction, hemorrhage, extra-axial collection, ventriculomegaly, or mass effect. Generalized cerebral atrophy. Periventricular white matter low attenuation likely secondary to microangiopathy. Vascular: Cerebrovascular atherosclerotic calcifications  are noted. Skull: Negative for fracture or focal lesion. Sinuses/Orbits: Visualized portions of the orbits are unremarkable. Visualized portions of the paranasal sinuses and mastoid air cells are unremarkable. Other: None. CT MAXILLOFACIAL FINDINGS Osseous: No fracture or mandibular dislocation. No destructive process. 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 Orbits: Negative. No traumatic or inflammatory finding. Sinuses: Clear. Soft tissues: Negative. CT CERVICAL SPINE FINDINGS Alignment: Normal. Skull base and vertebrae: No acute fracture. No primary bone lesion or focal pathologic process. Soft tissues and spinal canal: No prevertebral fluid or swelling. No visible canal hematoma. Disc levels: Degenerative disc disease with mild disc height loss at C3-4, C4-5 and C5-6. Mild broad-based disc osteophyte complex at C3-4, C4-5, C5-6 and C6-7. Bilateral facet arthropathy at C5-6. Upper chest: Bilateral lung apices are clear. Other: No fluid collection or hematoma. IMPRESSION: 1. No acute intracranial pathology. 2.  No acute osseous injury of the maxillofacial bones. 3.  No acute osseous injury of the cervical spine. Electronically Signed   By: Kathreen Devoid   On: 06/14/2017 09:23   Ct Cervical Spine Wo Contrast  Result Date: 06/14/2017 CLINICAL DATA:  Status post fall. EXAM: CT HEAD WITHOUT CONTRAST CT MAXILLOFACIAL WITHOUT CONTRAST CT CERVICAL SPINE WITHOUT CONTRAST TECHNIQUE: Multidetector CT imaging of the head, cervical spine, and maxillofacial structures were performed using the standard protocol without intravenous contrast. Multiplanar CT image reconstructions of the cervical spine and maxillofacial structures were also generated. COMPARISON:  None. FINDINGS: CT HEAD FINDINGS Brain: No evidence of acute infarction, hemorrhage, extra-axial collection, ventriculomegaly, or mass effect. Generalized cerebral atrophy. Periventricular white matter low attenuation likely secondary to  microangiopathy. Vascular: Cerebrovascular atherosclerotic calcifications are noted. Skull: Negative for fracture or focal lesion. Sinuses/Orbits: Visualized portions of the orbits are unremarkable. Visualized portions of the paranasal sinuses and mastoid air cells are unremarkable. Other: None. CT MAXILLOFACIAL FINDINGS Osseous: No fracture or mandibular dislocation. No destructive process. 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 Orbits: Negative. No traumatic or inflammatory finding. Sinuses: Clear. Soft tissues: Negative. CT CERVICAL SPINE FINDINGS Alignment: Normal. Skull base and vertebrae: No acute fracture. No primary bone lesion or focal pathologic process. Soft tissues and spinal canal: No prevertebral fluid or swelling. No visible canal hematoma. Disc levels: Degenerative disc disease with mild disc height loss at C3-4, C4-5 and C5-6. Mild broad-based disc osteophyte complex at C3-4, C4-5, C5-6 and C6-7. Bilateral facet arthropathy at C5-6. Upper chest: Bilateral lung apices are clear. Other: No fluid collection or hematoma. IMPRESSION: 1.  No acute intracranial pathology. 2.  No acute osseous injury of the maxillofacial bones. 3.  No acute osseous injury of the cervical spine. Electronically Signed   By: Kathreen Devoid   On: 06/14/2017 09:23   Ct Maxillofacial Wo Contrast  Result Date: 06/14/2017 CLINICAL DATA:  Status post fall. EXAM: CT HEAD WITHOUT CONTRAST CT MAXILLOFACIAL WITHOUT CONTRAST CT CERVICAL SPINE WITHOUT CONTRAST TECHNIQUE: Multidetector CT imaging of the head, cervical spine, and maxillofacial structures were performed using the standard protocol without intravenous contrast. Multiplanar CT image reconstructions of the cervical spine and maxillofacial structures were also generated. COMPARISON:  None. FINDINGS: CT HEAD FINDINGS Brain: No evidence of acute infarction, hemorrhage, extra-axial collection, ventriculomegaly, or mass effect. Generalized cerebral atrophy.  Periventricular white matter low attenuation likely secondary to microangiopathy. Vascular: Cerebrovascular atherosclerotic calcifications are noted. Skull: Negative for fracture or focal lesion. Sinuses/Orbits: Visualized portions of the orbits are unremarkable. Visualized portions of the paranasal sinuses and mastoid air cells are unremarkable. Other: None. CT MAXILLOFACIAL FINDINGS Osseous: No fracture or mandibular dislocation. No destructive process. 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 Orbits: Negative. No traumatic or inflammatory finding. Sinuses: Clear. Soft tissues: Negative. CT CERVICAL SPINE FINDINGS Alignment: Normal. Skull base and vertebrae: No acute fracture. No primary bone lesion or focal pathologic process. Soft tissues and spinal canal: No prevertebral fluid or swelling. No visible canal hematoma. Disc levels: Degenerative disc disease with mild disc height loss at C3-4, C4-5 and C5-6. Mild broad-based disc osteophyte complex at C3-4, C4-5, C5-6 and C6-7. Bilateral facet arthropathy at C5-6. Upper chest: Bilateral lung apices are clear. Other: No fluid collection or hematoma. IMPRESSION: 1. No acute intracranial pathology. 2.  No acute osseous injury of the maxillofacial bones. 3.  No acute osseous injury of the cervical spine. Electronically Signed   By: Kathreen Devoid   On: 06/14/2017 09:23    Procedures Procedures (including critical care time)  Medications Ordered in ED Medications - No data to display   Initial Impression / Assessment and Plan / ED Course  I have reviewed the triage vital signs and the nursing notes.  Pertinent labs & imaging results that were available during my care of the patient were reviewed by me and considered in my medical decision making (see chart for details).  Fall with chipped tooth and pain in the right shoulder. No evidence of serious injury on exam, but will send for CT of head, maxillofacial, cervical spine and plain  x-rays of right shoulder. Old records are reviewed, and she does have prior ED visits for falls.  CT and x-rays are negative for acute injury. She is discharged with instructions to follow-up with her dentist, take over-the-counter analgesics as needed for pain.  Final Clinical Impressions(s) / ED Diagnoses   Final diagnoses:  Fall, initial encounter  Contusion of lip, initial encounter  Closed fracture of tooth, initial encounter  Contusion of multiple sites of right shoulder, initial encounter    New Prescriptions New Prescriptions   No medications on file     Delora Fuel, MD 06/15/24 1036

## 2017-06-29 ENCOUNTER — Emergency Department (HOSPITAL_COMMUNITY)
Admission: EM | Admit: 2017-06-29 | Discharge: 2017-06-29 | Disposition: A | Discharge status: Home / Self Care | Payer: 59 | Attending: Emergency Medicine | Admitting: Emergency Medicine

## 2017-06-29 ENCOUNTER — Encounter (HOSPITAL_COMMUNITY): Payer: Self-pay | Admitting: *Deleted

## 2017-06-29 ENCOUNTER — Emergency Department (HOSPITAL_COMMUNITY): Payer: 59

## 2017-06-29 DIAGNOSIS — S4992XA Unspecified injury of left shoulder and upper arm, initial encounter: Secondary | ICD-10-CM | POA: Diagnosis not present

## 2017-06-29 DIAGNOSIS — Y92000 Kitchen of unspecified non-institutional (private) residence as  the place of occurrence of the external cause: Secondary | ICD-10-CM | POA: Diagnosis not present

## 2017-06-29 DIAGNOSIS — X509XXA Other and unspecified overexertion or strenuous movements or postures, initial encounter: Secondary | ICD-10-CM | POA: Diagnosis not present

## 2017-06-29 DIAGNOSIS — W19XXXA Unspecified fall, initial encounter: Secondary | ICD-10-CM

## 2017-06-29 DIAGNOSIS — Y998 Other external cause status: Secondary | ICD-10-CM | POA: Insufficient documentation

## 2017-06-29 DIAGNOSIS — S40012A Contusion of left shoulder, initial encounter: Secondary | ICD-10-CM | POA: Diagnosis not present

## 2017-06-29 DIAGNOSIS — E119 Type 2 diabetes mellitus without complications: Secondary | ICD-10-CM | POA: Insufficient documentation

## 2017-06-29 DIAGNOSIS — Z7984 Long term (current) use of oral hypoglycemic drugs: Secondary | ICD-10-CM | POA: Diagnosis not present

## 2017-06-29 DIAGNOSIS — Z87891 Personal history of nicotine dependence: Secondary | ICD-10-CM | POA: Insufficient documentation

## 2017-06-29 DIAGNOSIS — I10 Essential (primary) hypertension: Secondary | ICD-10-CM | POA: Diagnosis not present

## 2017-06-29 DIAGNOSIS — Y9301 Activity, walking, marching and hiking: Secondary | ICD-10-CM | POA: Diagnosis not present

## 2017-06-29 DIAGNOSIS — Z96651 Presence of right artificial knee joint: Secondary | ICD-10-CM | POA: Insufficient documentation

## 2017-06-29 DIAGNOSIS — Z7982 Long term (current) use of aspirin: Secondary | ICD-10-CM | POA: Diagnosis not present

## 2017-06-29 DIAGNOSIS — Z79899 Other long term (current) drug therapy: Secondary | ICD-10-CM | POA: Diagnosis not present

## 2017-06-29 DIAGNOSIS — S7002XA Contusion of left hip, initial encounter: Secondary | ICD-10-CM | POA: Diagnosis not present

## 2017-06-29 DIAGNOSIS — M25552 Pain in left hip: Secondary | ICD-10-CM | POA: Diagnosis not present

## 2017-06-29 DIAGNOSIS — S79912A Unspecified injury of left hip, initial encounter: Secondary | ICD-10-CM | POA: Diagnosis not present

## 2017-06-29 DIAGNOSIS — M25512 Pain in left shoulder: Secondary | ICD-10-CM | POA: Diagnosis not present

## 2017-06-29 MED ORDER — OXYCODONE-ACETAMINOPHEN 5-325 MG PO TABS
1.0000 | ORAL_TABLET | Freq: Once | ORAL | Status: AC
  Administered 2017-06-29: 1 via ORAL
  Filled 2017-06-29: qty 1

## 2017-06-29 MED ORDER — TRAMADOL HCL 50 MG PO TABS: 50.0000 mg | ORAL_TABLET | Freq: Four times a day (QID) | ORAL | 0 refills | Status: DC | PRN

## 2017-06-29 NOTE — ED Triage Notes (Signed)
Patient was at  Work in [employer] and tripped over a food cart. C/o pain on her left side. No obv. Injuries. States her left shoulder hurts.

## 2017-06-29 NOTE — ED Provider Notes (Signed)
[facility] EMERGENCY DEPARTMENT Provider Note   CSN: [id number] Arrival date & time: 06/29/17  5852     History   Chief Complaint Chief Complaint  Patient presents with  . Fall    HPI Savannah Lewis is a 76 y.o. female.  Patient is a 76 year old female who presents after a fall.  She states that she was walking in the kitchen and tripped on a cart that was in the kitchen.  She fell over onto her left side.  She states that she did not hit her head.  She denies any head or neck pain.  No back pain.  She complains of pain to her left shoulder and left hip.  She denies any numbness or weakness to her extremities.  No chest pain or shortness of breath.  She is not on anticoagulants.      Past Medical History:  Diagnosis Date  . Cervical spondylosis   . DDD (degenerative disc disease), cervical   . De Quervain's tenosynovitis, left    WRIST  . Diabetic retinopathy, nonproliferative ([place])   . Diverticulosis   . DJD (degenerative joint disease)    left knee  . Heart murmur    told a last physical a slight heart murmurper patient  . 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 History of Bell's palsy    2003  RIGHT SIDE-  resolved  . Hypertension   . Thoracic aortic aneurysm [facility])    followed by Dr Tharon Aquas Trigt- LOV- 02/2013 in [facility]   . Thoracic ascending aortic aneurysm ([place])    4.1 CM  STABLE SINCE 2004 PER DR VANTRIGHT NOTE 02/2013  . TMJ (temporomandibular joint syndrome)   . Type 2 diabetes mellitus ([place])   . Wears glasses     Patient Active Problem List   Diagnosis Date Noted  . 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 S/P knee replacement 08/31/2014  . Thoracic ascending aortic aneurysm ([place])   . Hypertension   . Arthritis   . Diabetes ([place])   . Diabetes mellitus ([place]) 09/06/2012  . HTN (hypertension) 09/06/2012    Past Surgical History:  Procedure Laterality Date  .  ABDOMINAL HYSTERECTOMY  1995   W/ UNILATERAL SALPINGOOPHORECTOMY  . JOINT REPLACEMENT    . KNEE ARTHROSCOPY Bilateral RIGHT 2002 & 1994/   LEFT  1995  . LAPAROSCOPY ASSISTED  RIGHT COLECTOMY  03-22-2005   CECUM POLYP  . ROTATOR CUFF REPAIR Right 08-31-2008  . TOTAL KNEE ARTHROPLASTY Right 07-01-2009    OB History    No data available       Home Medications    Prior to Admission medications   Medication Sig Start Date End Date Taking? Authorizing Provider  acetaminophen (TYLENOL) 500 MG tablet Take 1,000 mg by mouth every 6 (six) hours as needed for mild pain, moderate pain, fever or headache.    [provider]  amLODipine (NORVASC) 10 MG tablet Take 10 mg by mouth every morning.     [provider]  aspirin EC 81 MG tablet Take 81 mg by mouth daily.    [provider]  ferrous sulfate 325 (65 FE) MG tablet Take 1 tablet (325 mg total) by mouth 3 (three) times daily after meals. Patient taking differently: Take 325 mg by mouth daily with breakfast.  09/03/14   Stilwell, Bryson L, PA-C  fluticasone (FLONASE) 50 MCG/ACT nasal spray Place  2 sprays into both nostrils daily. Patient taking differently: Place 2 sprays into both nostrils daily as needed for allergies.  01/10/17   Alveda Reasons, MD  losartan-hydrochlorothiazide (HYZAAR) 100-25 MG per tablet Take 1 tablet by mouth daily.    [provider]  metFORMIN (GLUCOPHAGE) 1000 MG tablet Take 1,000 mg by mouth 2 (two) times daily with a meal.    [provider]  Multiple Vitamins-Minerals (MULTIVITAMIN WITH MINERALS) tablet Take 1 tablet by mouth daily.    [provider]  potassium chloride (K-DUR) 10 MEQ tablet Take 10 mEq by mouth 2 (two) times daily.    [provider]  traMADol (ULTRAM) 50 MG tablet Take 1 tablet (50 mg total) every 6 (six) hours as needed by mouth. 06/29/17   Malvin Johns, MD    Family History Family History  Problem Relation Age of Onset  .  Cancer Mother   . Diabetes Father     Social History Social History   Tobacco Use  . Smoking status: Former Smoker    Years: 2.00    Types: Cigarettes    Last attempt to quit: 08/21/1959    Years since quitting: 57.8  . Smokeless tobacco: Never Used  Substance Use Topics  . Alcohol use: No  . Drug use: No     Allergies   Patient has no known allergies.   Review of Systems Review of Systems  Constitutional: Negative for activity change, appetite change and fever.  HENT: Negative for dental problem, nosebleeds and trouble swallowing.   Eyes: Negative for pain and visual disturbance.  Respiratory: Negative for shortness of breath.   Cardiovascular: Negative for chest pain.  Gastrointestinal: Negative for abdominal pain, nausea and vomiting.  Genitourinary: Negative for dysuria and hematuria.  Musculoskeletal: Positive for arthralgias. Negative for back pain, joint swelling and neck pain.  Skin: Negative for wound.  Neurological: Negative for weakness, numbness and headaches.  Psychiatric/Behavioral: Negative for confusion.     Physical Exam Updated Vital Signs BP (!) 156/66 (BP Location: Right Arm)   Pulse 78   Temp 98.6 F (37 C) (Oral)   Resp 16   Ht 5\' 2"  (1.575 m)   Wt 58.1 kg (128 lb)   SpO2 100%   BMI 23.41 kg/m   Physical Exam  Constitutional: She is oriented to person, place, and time. She appears well-developed and well-nourished.  HENT:  Head: Normocephalic and atraumatic.  Nose: Nose normal.  Eyes: Conjunctivae are normal. Pupils are equal, round, and reactive to light.  Neck:  No pain to the cervical, thoracic, or LS spine.  No step-offs or deformities noted  Cardiovascular: Normal rate and regular rhythm.  No murmur heard. Pulmonary/Chest: Effort normal and breath sounds normal. No respiratory distress. She has no wheezes. She exhibits no tenderness.  Abdominal: Soft. Bowel sounds are normal. She exhibits no distension. There is no tenderness.    Musculoskeletal: Normal range of motion.  Patient has chronic pain to the left shoulder but it currently is exacerbated.  She has generalized tenderness throughout the left shoulder.  There is no swelling or deformity.  No pain to the elbow or wrist.  She has normal sensation and motor function in the hand.  There is also pain on range of motion of the left hip.  No deformities noted.   there is no pain to the knee or ankle.  Pedal pulses are intact.  She has normal sensation and motor function in the lower extremities.  Neurological: She  is alert and oriented to person, place, and time.  Skin: Skin is warm and dry. Capillary refill takes less than 2 seconds.  Psychiatric: She has a normal mood and affect.  Vitals reviewed.    ED Treatments / Results  Labs (all labs ordered are listed, but only abnormal results are displayed) Labs Reviewed - No data to display  EKG  EKG Interpretation None       Radiology Dg Shoulder Left  Result Date: 06/29/2017 CLINICAL DATA:  Post fall, now with left shoulder pain. EXAM: LEFT SHOULDER - 2+ VIEW COMPARISON:  11/11/2015 FINDINGS: No fracture or dislocation. Glenohumeral joint spaces appear preserved given obliquity mild degenerative change of the left AC joint with joint space loss, subchondral sclerosis and inferiorly directed osteophytosis. No evidence of calcific tendinitis. Regional soft tissues appear normal. Limited visualization of the adjacent thorax is normal. IMPRESSION: 1. No acute findings. 2. Mild degenerative change of the left AC joint. Electronically Signed   By: Sandi Mariscal M.D.   On: 06/29/2017 09:25   Dg Hip Unilat W Or Wo Pelvis 2-3 Views Left  Result Date: 06/29/2017 CLINICAL DATA:  Pain following fall EXAM: DG HIP (WITH OR WITHOUT PELVIS) 2-3V LEFT COMPARISON:  None. FINDINGS: Frontal pelvis as well as frontal and lateral left hip images were obtained. No fracture or dislocation. There is slight symmetric narrowing of both hip  joints. No erosive change. Sacroiliac joints appear unremarkable bilaterally. IMPRESSION: Slight symmetric narrowing both hip joints. No fracture or dislocation. Electronically Signed   By: Lowella Grip III M.D.   On: 06/29/2017 09:23    Procedures Procedures (including critical care time)  Medications Ordered in ED Medications  oxyCODONE-acetaminophen (PERCOCET/ROXICET) 5-325 MG per tablet 1 tablet (1 tablet Oral Given 06/29/17 0901)     Initial Impression / Assessment and Plan / ED Course  I have reviewed the triage vital signs and the nursing notes.  Pertinent labs & imaging results that were available during my care of the patient were reviewed by me and considered in my medical decision making (see chart for details).     Patient is a 76 year old female who presents after mechanical fall.  She did not hit her head.  She has no back pain.  She has pain in her left shoulder and hip.  X-rays are negative for acute fracture or subluxation.  She is able to ambulate without difficulty.  She was discharged home in good condition.  She was given a prescription for tramadol for pain.  She was encouraged to follow-up with her PCP.  Return precautions were given.  Final Clinical Impressions(s) / ED Diagnoses   Final diagnoses:  Fall, initial encounter  Contusion of left hip, initial encounter  Contusion of left shoulder, initial encounter    ED Discharge Orders        Ordered    traMADol (ULTRAM) 50 MG tablet  Every 6 hours PRN     06/29/17 0940       Malvin Johns, MD 06/29/17 [phone]

## 2017-06-29 NOTE — ED Notes (Signed)
Ambulated to bathroom , very little assistance needed.

## 2017-06-29 NOTE — ED Notes (Signed)
Returned from xray

## 2017-07-01 ENCOUNTER — Other Ambulatory Visit: Payer: Self-pay | Admitting: Internal Medicine

## 2017-07-01 ENCOUNTER — Ambulatory Visit
Admission: RE | Admit: 2017-07-01 | Discharge: 2017-07-01 | Disposition: A | Discharge status: Home / Self Care | Payer: 59 | Source: Ambulatory Visit | Attending: Internal Medicine | Admitting: Internal Medicine

## 2017-07-01 DIAGNOSIS — R1030 Lower abdominal pain, unspecified: Secondary | ICD-10-CM

## 2017-07-01 DIAGNOSIS — M75122 Complete rotator cuff tear or rupture of left shoulder, not specified as traumatic: Secondary | ICD-10-CM | POA: Diagnosis not present

## 2017-07-01 DIAGNOSIS — K5730 Diverticulosis of large intestine without perforation or abscess without bleeding: Secondary | ICD-10-CM | POA: Diagnosis not present

## 2017-07-01 DIAGNOSIS — M545 Low back pain: Secondary | ICD-10-CM | POA: Diagnosis not present

## 2017-07-01 DIAGNOSIS — G8929 Other chronic pain: Secondary | ICD-10-CM | POA: Diagnosis not present

## 2017-07-01 DIAGNOSIS — M546 Pain in thoracic spine: Secondary | ICD-10-CM | POA: Diagnosis not present

## 2017-07-01 DIAGNOSIS — R296 Repeated falls: Secondary | ICD-10-CM | POA: Diagnosis not present

## 2017-07-01 MED ORDER — IOPAMIDOL (ISOVUE-300) INJECTION 61%
100.0000 mL | Freq: Once | INTRAVENOUS | Status: AC | PRN
  Administered 2017-07-01: 100 mL via INTRAVENOUS

## 2017-07-03 DIAGNOSIS — W19XXXA Unspecified fall, initial encounter: Secondary | ICD-10-CM | POA: Diagnosis not present

## 2017-07-03 DIAGNOSIS — M25312 Other instability, left shoulder: Secondary | ICD-10-CM | POA: Diagnosis not present

## 2017-07-03 DIAGNOSIS — M19012 Primary osteoarthritis, left shoulder: Secondary | ICD-10-CM | POA: Diagnosis not present

## 2017-07-03 DIAGNOSIS — S7002XA Contusion of left hip, initial encounter: Secondary | ICD-10-CM | POA: Diagnosis not present

## 2017-07-09 DIAGNOSIS — R634 Abnormal weight loss: Secondary | ICD-10-CM | POA: Diagnosis not present

## 2017-07-09 DIAGNOSIS — M546 Pain in thoracic spine: Secondary | ICD-10-CM | POA: Diagnosis not present

## 2017-07-09 DIAGNOSIS — M545 Low back pain: Secondary | ICD-10-CM | POA: Diagnosis not present

## 2017-07-09 DIAGNOSIS — M25552 Pain in left hip: Secondary | ICD-10-CM | POA: Diagnosis not present

## 2017-07-24 DIAGNOSIS — Z96652 Presence of left artificial knee joint: Secondary | ICD-10-CM | POA: Diagnosis not present

## 2017-07-24 DIAGNOSIS — M546 Pain in thoracic spine: Secondary | ICD-10-CM | POA: Diagnosis not present

## 2017-07-24 DIAGNOSIS — M25562 Pain in left knee: Secondary | ICD-10-CM | POA: Diagnosis not present

## 2017-07-24 DIAGNOSIS — M545 Low back pain: Secondary | ICD-10-CM | POA: Diagnosis not present

## 2017-08-07 DIAGNOSIS — M5442 Lumbago with sciatica, left side: Secondary | ICD-10-CM | POA: Diagnosis not present

## 2017-08-09 DIAGNOSIS — M5442 Lumbago with sciatica, left side: Secondary | ICD-10-CM | POA: Diagnosis not present

## 2017-08-14 DIAGNOSIS — M5442 Lumbago with sciatica, left side: Secondary | ICD-10-CM | POA: Diagnosis not present

## 2017-08-19 DIAGNOSIS — M5442 Lumbago with sciatica, left side: Secondary | ICD-10-CM | POA: Diagnosis not present

## 2017-08-21 DIAGNOSIS — M545 Low back pain: Secondary | ICD-10-CM | POA: Diagnosis not present

## 2017-08-21 DIAGNOSIS — M546 Pain in thoracic spine: Secondary | ICD-10-CM | POA: Insufficient documentation

## 2017-08-21 DIAGNOSIS — M25562 Pain in left knee: Secondary | ICD-10-CM | POA: Diagnosis not present

## 2017-10-02 DIAGNOSIS — H0013 Chalazion right eye, unspecified eyelid: Secondary | ICD-10-CM | POA: Diagnosis not present

## 2017-10-15 DIAGNOSIS — I10 Essential (primary) hypertension: Secondary | ICD-10-CM | POA: Diagnosis not present

## 2017-10-15 DIAGNOSIS — E119 Type 2 diabetes mellitus without complications: Secondary | ICD-10-CM | POA: Diagnosis not present

## 2017-11-20 DIAGNOSIS — E119 Type 2 diabetes mellitus without complications: Secondary | ICD-10-CM | POA: Diagnosis not present

## 2017-12-18 DIAGNOSIS — M25512 Pain in left shoulder: Secondary | ICD-10-CM | POA: Diagnosis not present

## 2018-01-23 ENCOUNTER — Other Ambulatory Visit: Payer: Self-pay

## 2018-01-23 ENCOUNTER — Encounter (HOSPITAL_COMMUNITY): Payer: Self-pay | Admitting: Emergency Medicine

## 2018-01-23 ENCOUNTER — Emergency Department (HOSPITAL_COMMUNITY)
Admission: EM | Admit: 2018-01-23 | Discharge: 2018-01-23 | Disposition: A | Discharge status: Home / Self Care | Payer: 59 | Attending: Emergency Medicine | Admitting: Emergency Medicine

## 2018-01-23 ENCOUNTER — Emergency Department (HOSPITAL_COMMUNITY): Payer: 59

## 2018-01-23 DIAGNOSIS — W19XXXA Unspecified fall, initial encounter: Secondary | ICD-10-CM

## 2018-01-23 DIAGNOSIS — S4992XA Unspecified injury of left shoulder and upper arm, initial encounter: Secondary | ICD-10-CM | POA: Diagnosis not present

## 2018-01-23 DIAGNOSIS — Z87891 Personal history of nicotine dependence: Secondary | ICD-10-CM | POA: Insufficient documentation

## 2018-01-23 DIAGNOSIS — M25512 Pain in left shoulder: Secondary | ICD-10-CM | POA: Insufficient documentation

## 2018-01-23 DIAGNOSIS — Z7984 Long term (current) use of oral hypoglycemic drugs: Secondary | ICD-10-CM | POA: Diagnosis not present

## 2018-01-23 DIAGNOSIS — Z96653 Presence of artificial knee joint, bilateral: Secondary | ICD-10-CM | POA: Diagnosis not present

## 2018-01-23 DIAGNOSIS — Z7982 Long term (current) use of aspirin: Secondary | ICD-10-CM | POA: Insufficient documentation

## 2018-01-23 DIAGNOSIS — E119 Type 2 diabetes mellitus without complications: Secondary | ICD-10-CM | POA: Diagnosis not present

## 2018-01-23 DIAGNOSIS — Z79899 Other long term (current) drug therapy: Secondary | ICD-10-CM | POA: Insufficient documentation

## 2018-01-23 DIAGNOSIS — I10 Essential (primary) hypertension: Secondary | ICD-10-CM | POA: Diagnosis not present

## 2018-01-23 NOTE — ED Notes (Signed)
Pt in Xray, Xray notified to bring pt to room A5.

## 2018-01-23 NOTE — ED Provider Notes (Signed)
[facility] EMERGENCY DEPARTMENT Provider Note   CSN: [id number] Arrival date & time: 01/23/18  1517     History   Chief Complaint Chief Complaint  Patient presents with  . Fall    HPI Savannah Lewis is a 77 y.o. female.  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  Patient states that her manager was backing up and turned in front of her earlier today.  She lost her footing and fell on her left side.  She denies hitting her head or loss of consciousness.  Reports that she now has left shoulder pain grossly over the head of the humerus.  States that she has no pain at rest, but shoulder pain becomes 4/10 in severity and aching when she moves the shoulder in any way.  She has not taken any over-the-counter medications for her symptoms. She denies numbness, weakness, joint swelling, open wound, arthralgias elsewhere, neck pain, back pain.   Past Medical History:  Diagnosis Date  . Cervical spondylosis   . DDD (degenerative disc disease), cervical   . De Quervain's tenosynovitis, left    WRIST  . Diabetic retinopathy, nonproliferative ([place])   . Diverticulosis   . DJD (degenerative joint disease)    left knee  . Heart murmur    told a last physical a slight heart murmurper patient  . 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 History of Bell's palsy    2003  RIGHT SIDE-  resolved  . Hypertension   . Thoracic aortic aneurysm [facility])    followed by Dr Tharon Aquas Trigt- LOV- 02/2013 in [facility]   . Thoracic ascending aortic aneurysm ([place])    4.1 CM  STABLE SINCE 2004 PER DR VANTRIGHT NOTE 02/2013  . TMJ (temporomandibular joint syndrome)   . Type 2 diabetes mellitus ([place])   . Wears glasses      Patient Active Problem List   Diagnosis Date Noted  . 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 S/P knee replacement 08/31/2014  . Thoracic ascending aortic aneurysm ([place])   . Hypertension   . Arthritis   . Diabetes ([place])   . Diabetes mellitus ([place]) 09/06/2012  . HTN (hypertension) 09/06/2012    Past Surgical History:  Procedure Laterality Date  . ABDOMINAL HYSTERECTOMY  1995   W/ UNILATERAL SALPINGOOPHORECTOMY  . COLONOSCOPY N/A 04/14/2013   Procedure: COLONOSCOPY;  Surgeon: Garlan Fair, MD;  Location: [facility];  Service: Endoscopy;  Laterality: N/A;  . DORSAL COMPARTMENT RELEASE Left 09/24/2013   Procedure: LEFT WRIST FIRST DORSAL COMPARTMENT TENOSYNOVITIS;  Surgeon: Linna Hoff, MD;  Location: [facility];  Service: Orthopedics;  Laterality: Left;  ANESTHESIA: LOCAL/IV SEDATION  . JOINT REPLACEMENT    . KNEE ARTHROSCOPY Bilateral RIGHT 2002 & 1994/   LEFT  1995  . LAPAROSCOPY ASSISTED  RIGHT COLECTOMY  03-22-2005   CECUM POLYP  . ROTATOR CUFF REPAIR Right 08-31-2008  . TOTAL KNEE ARTHROPLASTY Right 07-01-2009  . TOTAL KNEE ARTHROPLASTY Left 08/31/2014   Procedure: LEFT TOTAL KNEE ARTHROPLASTY;  Surgeon: Sydnee Cabal, MD;  Location: [facility];  Service: Orthopedics;  Laterality: Left;     OB History   None      Home Medications  Prior to Admission medications   Medication Sig Start Date End Date Taking? Authorizing Provider  acetaminophen (TYLENOL) 500 MG tablet Take 1,000 mg by mouth every 6 (six) hours as needed for mild pain, moderate pain, fever or headache.    [provider]  amLODipine (NORVASC) 10 MG tablet Take 10 mg by mouth every morning.     [provider]  aspirin EC 81 MG tablet Take 81 mg by mouth daily.    [provider]  ferrous sulfate 325 (65 FE) MG tablet Take 1 tablet (325 mg total) by mouth 3 (three) times daily after meals. Patient taking differently: Take 325 mg by mouth daily with  breakfast.  09/03/14   Stilwell, Bryson L, PA-C  fluticasone (FLONASE) 50 MCG/ACT nasal spray Place 2 sprays into both nostrils daily. Patient taking differently: Place 2 sprays into both nostrils daily as needed for allergies.  01/10/17   Alveda Reasons, MD  ibuprofen (ADVIL,MOTRIN) 200 MG tablet Take 200 mg every 6 (six) hours as needed by mouth for headache or mild pain.    [provider]  losartan-hydrochlorothiazide (HYZAAR) 100-25 MG per tablet Take 1 tablet by mouth daily.    [provider]  metFORMIN (GLUCOPHAGE) 1000 MG tablet Take 1,000 mg by mouth 2 (two) times daily with a meal.    [provider]  Multiple Vitamins-Minerals (MULTIVITAMIN WITH MINERALS) tablet Take 1 tablet by mouth daily.    [provider]  potassium chloride (K-DUR) 10 MEQ tablet Take 10 mEq by mouth 2 (two) times daily.    [provider]  traMADol (ULTRAM) 50 MG tablet Take 1 tablet (50 mg total) every 6 (six) hours as needed by mouth. 06/29/17   Malvin Johns, MD    Family History Family History  Problem Relation Age of Onset  . Cancer Mother   . Diabetes Father     Social History Social History   Tobacco Use  . Smoking status: Former Smoker    Years: 2.00    Types: Cigarettes    Last attempt to quit: 08/21/1959    Years since quitting: 58.4  . Smokeless tobacco: Never Used  Substance Use Topics  . Alcohol use: No  . Drug use: No     Allergies   Patient has no known allergies.   Review of Systems Review of Systems  Musculoskeletal: Positive for arthralgias (left shoulder). Negative for back pain, gait problem and neck pain.  Skin: Negative for color change and wound.  Neurological: Negative for weakness, numbness and headaches.     Physical Exam Updated Vital Signs BP 129/69 (BP Location: Left Arm)   Pulse 99   Temp 98.1 F (36.7 C) (Oral)   Resp 14   SpO2 100%   Physical Exam  Constitutional: She is oriented to person, place, and  time. She appears well-developed and well-nourished. No distress.  HENT:  Head: Normocephalic and atraumatic.  Eyes: Right eye exhibits no discharge. Left eye exhibits no discharge.  Pulmonary/Chest: Effort normal. No respiratory distress.  Musculoskeletal:  Left shoulder with tenderness to palpation generally over the scapular spine and head of the humurus as depicted in image. Full flexion and extension of the shoulder, abduction painful and somewhat limited. Negative empty can test. Positive Neer's. No swelling, erythema or ecchymosis present. No step-off, crepitus, or deformity appreciated. 5/5 muscle strength of UE. 2+ radial pulse, sensation intact and all compartments soft. No tenderness over the elbow joint.   Neurological: She is alert and  oriented to person, place, and time. Coordination normal.  Skin: Skin is warm and dry. Capillary refill takes less than 2 seconds. She is not diaphoretic.  Psychiatric: She has a normal mood and affect. Her behavior is normal.  Nursing note and vitals reviewed.    ED Treatments / Results  Labs (all labs ordered are listed, but only abnormal results are displayed) Labs Reviewed - No data to display  EKG None  Radiology Dg Shoulder Left  Result Date: 01/23/2018 CLINICAL DATA:  Fall on outstretched arm. Previous rotator cuff tear. Left shoulder pain. Initial encounter. EXAM: LEFT SHOULDER - 2+ VIEW COMPARISON:  Left shoulder radiographs 06/29/2017. FINDINGS: The left shoulder is located. Degenerative changes are again noted at the [facility] joint. No acute abnormalities present. Clavicle is intact. The visualized left hemithorax is clear. IMPRESSION: 1. Degenerative changes of the left shoulder. 2. No acute abnormality. Electronically Signed   By: San Morelle M.D.   On: 01/23/2018 16:48    Procedures Procedures (including critical care time)  Medications Ordered in ED Medications - No data to display   Initial Impression / Assessment and  Plan / ED Course  I have reviewed the triage vital signs and the nursing notes.  Pertinent labs & imaging results that were available during my care of the patient were reviewed by me and considered in my medical decision making (see chart for details).  Clinical Course as of Jan 23 1801  Thu Jan 23, 2018  1801 Pleasant 77 year old female who works here had a fall with injury to her left shoulder.  She is got pain with range of motion but x-rays were negative for fracture dislocation.  She will be treated with a sling and follow-up with her PCP or orthopedist.   [MB]    Clinical Course User Index [MB] Hayden Rasmussen, MD    Xray left shoulder without acute fracture or abnormality, shows degenerative changes. Shoulder neurovascularly intact. No swelling, warmth or signs of infection. Likely shoulder impingement given positive Neers. Patient is discharged with instructions to use NSAIDs for pain and counseled her on RICE protocol. She can follow up with her orthopedic doctor in a week if her symptoms are not improving.  This was a shared visit with Dr. Melina Copa who also saw the patient and agrees with plan.  Final Clinical Impressions(s) / ED Diagnoses   Final diagnoses:  Fall, initial encounter  Acute pain of left shoulder    ED Discharge Orders    None       Bernarda Caffey 01/23/18 1805    Hayden Rasmussen, MD 01/25/18 1224

## 2018-01-23 NOTE — ED Triage Notes (Signed)
Pt states she was at work and tripped and fell on left arm. Pt complaining of left shoulder pain and buttocks pain.

## 2018-01-23 NOTE — Discharge Instructions (Signed)
Your x-ray was reassuring.  Please follow-up with your orthopedic doctor in a week if your symptoms are not improving.  I have written you a work note for limited lifting as you rest the shoulder.  Please take Aleve at home to help with your symptoms.  Ice as we discussed.  Elevate the shoulder when he can.

## 2018-01-31 ENCOUNTER — Other Ambulatory Visit: Payer: Self-pay | Admitting: *Deleted

## 2018-01-31 DIAGNOSIS — I712 Thoracic aortic aneurysm, without rupture, unspecified: Secondary | ICD-10-CM

## 2018-03-06 DIAGNOSIS — M25512 Pain in left shoulder: Secondary | ICD-10-CM | POA: Diagnosis not present

## 2018-03-06 DIAGNOSIS — M19012 Primary osteoarthritis, left shoulder: Secondary | ICD-10-CM | POA: Diagnosis not present

## 2018-03-12 ENCOUNTER — Ambulatory Visit (INDEPENDENT_AMBULATORY_CARE_PROVIDER_SITE_OTHER): Payer: 59 | Admitting: Cardiothoracic Surgery

## 2018-03-12 ENCOUNTER — Ambulatory Visit
Admission: RE | Admit: 2018-03-12 | Discharge: 2018-03-12 | Disposition: A | Discharge status: Home / Self Care | Payer: 59 | Source: Ambulatory Visit | Attending: Cardiothoracic Surgery | Admitting: Cardiothoracic Surgery

## 2018-03-12 ENCOUNTER — Encounter: Payer: Self-pay | Admitting: Cardiothoracic Surgery

## 2018-03-12 VITALS — BP 127/67 | HR 84 | Resp 20 | Ht 62.0 in | Wt 129.0 lb

## 2018-03-12 DIAGNOSIS — I712 Thoracic aortic aneurysm, without rupture, unspecified: Secondary | ICD-10-CM

## 2018-03-12 DIAGNOSIS — I77810 Thoracic aortic ectasia: Secondary | ICD-10-CM

## 2018-03-12 MED ORDER — GADOBENATE DIMEGLUMINE 529 MG/ML IV SOLN
11.0000 mL | Freq: Once | INTRAVENOUS | Status: AC | PRN
  Administered 2018-03-12: 11 mL via INTRAVENOUS

## 2018-03-12 NOTE — Progress Notes (Signed)
PCP is Lavone Orn, MD Referring Provider is Lavone Orn, MD  Chief Complaint  Patient presents with  . Thoracic Aortic Aneurysm    18 month f/u with MRA Chest    HPI: Patient returns for routine scheduled follow-up visit with MRA of thoracic aorta to assess mild fusiform dilatation of the ascending aorta stable at 4.1 cm.  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  She denies any chest pain.  MRI of the thoracic aorta shows no change in the 4.1 cm diameter.  There is no evidence of mural thickening or ulceration.  The patient is pending left shoulder surgery-rotator cuff repair by Dr. Linda Hedges later this summer.  The patient's blood pressure is very well controlled with losartan and amlodipine.  Past Medical History:  Diagnosis Date  . Cervical spondylosis   . DDD (degenerative disc disease), cervical   . De Quervain's tenosynovitis, left    WRIST  . Diabetic retinopathy, nonproliferative ([place])   . Diverticulosis   . DJD (degenerative joint disease)    left knee  . Heart murmur    told a last physical a slight heart murmurper patient  . 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 History of Bell's palsy    2003  RIGHT SIDE-  resolved  . Hypertension   . Thoracic aortic aneurysm [facility])    followed by Dr Tharon Aquas Trigt- LOV- 02/2013 in [facility]   . Thoracic ascending aortic aneurysm ([place])    4.1 CM  STABLE SINCE 2004 PER DR VANTRIGHT NOTE 02/2013  . TMJ (temporomandibular joint syndrome)   . Type 2 diabetes mellitus ([place])   . Wears glasses     Past Surgical History:  Procedure Laterality Date  . ABDOMINAL HYSTERECTOMY  1995   W/ UNILATERAL SALPINGOOPHORECTOMY  . COLONOSCOPY N/A 04/14/2013   Procedure: COLONOSCOPY;  Surgeon: Garlan Fair, MD;  [facility];  Service: Endoscopy;  Laterality: N/A;  . DORSAL COMPARTMENT RELEASE Left 09/24/2013   Procedure: LEFT WRIST FIRST DORSAL COMPARTMENT TENOSYNOVITIS;  Surgeon: Linna Hoff, MD;  Location: [place];  Service: Orthopedics;  Laterality: Left;  ANESTHESIA: LOCAL/IV SEDATION  . JOINT REPLACEMENT    . KNEE ARTHROSCOPY Bilateral RIGHT 2002 & 1994/   LEFT  1995  . LAPAROSCOPY ASSISTED  RIGHT COLECTOMY  03-22-2005   CECUM POLYP  . ROTATOR CUFF REPAIR Right 08-31-2008  . TOTAL KNEE ARTHROPLASTY Right 07-01-2009  . TOTAL KNEE ARTHROPLASTY Left 08/31/2014   Procedure: LEFT TOTAL KNEE ARTHROPLASTY;  Surgeon: Sydnee Cabal, MD;  [facility];  Service: Orthopedics;  Laterality: Left;    Family History  Problem Relation Age of Onset  . Cancer Mother   . Diabetes Father     Social History Social History   Tobacco Use  . Smoking status: Former Smoker    Years: 2.00    Types: Cigarettes    Last attempt to quit: 08/21/1959    Years since quitting: 58.5  . Smokeless tobacco: Never Used  Substance Use Topics  . Alcohol use: No  . Drug use: No    Current Outpatient Medications  Medication Sig Dispense Refill  . acetaminophen (TYLENOL) 500 MG tablet Take 1,000 mg by mouth every 6 (six) hours as needed for mild pain, moderate  pain, fever or headache.    Marland Kitchen amLODipine (NORVASC) 10 MG tablet Take 10 mg by mouth every morning.     . calcium carbonate (OSCAL) 1500 (600 Ca) MG TABS tablet Take by mouth daily with breakfast.    . fluticasone (FLONASE) 50 MCG/ACT nasal spray Place 2 sprays into both nostrils daily. (Patient taking differently: Place 2 sprays into both nostrils daily as needed for allergies. ) 16 g 6  . ibuprofen (ADVIL,MOTRIN) 200 MG tablet Take 200 mg every 6 (six) hours as needed by mouth for headache or mild pain.    Marland Kitchen losartan-hydrochlorothiazide (HYZAAR) 100-25 MG per tablet Take 1 tablet by mouth daily.    . metFORMIN (GLUCOPHAGE) 1000 MG tablet Take  1,000 mg by mouth 2 (two) times daily with a meal.    . Multiple Vitamins-Minerals (MULTIVITAMIN WITH MINERALS) tablet Take 1 tablet by mouth daily.    . potassium chloride (K-DUR) 10 MEQ tablet Take 10 mEq by mouth 2 (two) times daily.    . traMADol (ULTRAM) 50 MG tablet Take 1 tablet (50 mg total) every 6 (six) hours as needed by mouth. 15 tablet 0   No current facility-administered medications for this visit.     No Known Allergies  Review of Systems   Weight stable No orthopnea No ankle edema No chest pain No leg neuropathy No abdominal pain  BP 127/67   Pulse 84   Resp 20   Ht 5\' 2"  (1.575 m)   Wt 129 lb (58.5 kg)   SpO2 99% Comment: RA  BMI 23.59 kg/m  Physical Exam      Exam    General- alert and comfortable    Neck- no JVD, no cervical adenopathy palpable, no carotid bruit   Lungs- clear without rales, wheezes   Cor- regular rate and rhythm, no murmur , gallop   Abdomen- soft, non-tender   Extremities - warm, non-tender, minimal edema   Neuro- oriented, appropriate, no focal weakness   Diagnostic Tests: Images from MRA personally reviewed and discussed with patient. The aortic diameter remains mildly increased.  Impression: Fusiform aneurysm with very low risk of dissection at current measurement, less than 2%.  Best treatment is blood pressure control and compliance with blood pressure medications and surveillance scans.  Plan: Patient will return for repeat MRI of thoracic aorta in 18 months.  She plans on  Continuing  her heart healthy diet and lifestyle.   Len Childs, MD [facility] [phone]

## 2018-03-25 DIAGNOSIS — D649 Anemia, unspecified: Secondary | ICD-10-CM | POA: Diagnosis not present

## 2018-03-25 DIAGNOSIS — I10 Essential (primary) hypertension: Secondary | ICD-10-CM | POA: Diagnosis not present

## 2018-03-25 DIAGNOSIS — Z0000 Encounter for general adult medical examination without abnormal findings: Secondary | ICD-10-CM | POA: Diagnosis not present

## 2018-03-25 DIAGNOSIS — E1169 Type 2 diabetes mellitus with other specified complication: Secondary | ICD-10-CM | POA: Diagnosis not present

## 2018-03-25 DIAGNOSIS — D489 Neoplasm of uncertain behavior, unspecified: Secondary | ICD-10-CM | POA: Diagnosis not present

## 2018-03-25 DIAGNOSIS — I712 Thoracic aortic aneurysm, without rupture: Secondary | ICD-10-CM | POA: Diagnosis not present

## 2018-03-25 DIAGNOSIS — M25512 Pain in left shoulder: Secondary | ICD-10-CM | POA: Diagnosis not present

## 2018-03-25 DIAGNOSIS — Z7984 Long term (current) use of oral hypoglycemic drugs: Secondary | ICD-10-CM | POA: Diagnosis not present

## 2018-04-01 ENCOUNTER — Other Ambulatory Visit (HOSPITAL_COMMUNITY): Payer: Self-pay | Admitting: Internal Medicine

## 2018-04-01 ENCOUNTER — Ambulatory Visit (HOSPITAL_COMMUNITY): Payer: 59 | Attending: Cardiology

## 2018-04-01 ENCOUNTER — Other Ambulatory Visit: Payer: Self-pay

## 2018-04-01 DIAGNOSIS — I351 Nonrheumatic aortic (valve) insufficiency: Secondary | ICD-10-CM | POA: Diagnosis not present

## 2018-04-01 DIAGNOSIS — R011 Cardiac murmur, unspecified: Secondary | ICD-10-CM | POA: Insufficient documentation

## 2018-04-01 DIAGNOSIS — E119 Type 2 diabetes mellitus without complications: Secondary | ICD-10-CM | POA: Insufficient documentation

## 2018-04-01 DIAGNOSIS — I77819 Aortic ectasia, unspecified site: Secondary | ICD-10-CM

## 2018-04-30 NOTE — Pre-Procedure Instructions (Signed)
Savannah Lewis  04/30/2018      [place], [address]. [place]. [place] [address] Phone: [phone] Fax: [phone]    Your procedure is scheduled on Sept. 20  Report to [facility] Admitting at 10:30 A.M.  Call this number if you have problems the morning of surgery:  [phone]   Remember:  Do not eat or drink after midnight.      Take these medicines the morning of surgery with A SIP OF WATER :              Amlodipine (norvasc)             Eye drops                         7 days prior to surgery STOP taking any Aspirin(unless otherwise instructed by your surgeon), Aleve, Naproxen, Ibuprofen, Motrin, Advil, Goody's, BC's, all herbal medications, fish oil, and all vitamins                        How to Manage Your Diabetes Before and After Surgery  Why is it important to control my blood sugar before and after surgery? . Improving blood sugar levels before and after surgery helps healing and can limit problems. . A way of improving blood sugar control is eating a healthy diet by: o  Eating less sugar and carbohydrates o  Increasing activity/exercise o  Talking with your doctor about reaching your blood sugar goals . High blood sugars (greater than 180 mg/dL) can raise your risk of infections and slow your recovery, so you will need to focus on controlling your diabetes during the weeks before surgery. . Make sure that the doctor who takes care of your diabetes knows about your planned surgery including the date and location.  How do I manage my blood sugar before surgery? . Check your blood sugar at least 4 times a day, starting 2 days before surgery, to make sure that the level is not too high or low. o Check your blood sugar the morning of your surgery when you wake up and every 2 hours until you get to the Short Stay unit. . If your blood sugar is less than 70 mg/dL, you will need to  treat for low blood sugar: o Do not take insulin. o Treat a low blood sugar (less than 70 mg/dL) with  cup of clear juice (cranberry or apple), 4 glucose tablets, OR glucose gel. Recheck blood sugar in 15 minutes after treatment (to make sure it is greater than 70 mg/dL). If your blood sugar is not greater than 70 mg/dL on recheck, call [phone] o  for further instructions. . Report your blood sugar to the short stay nurse when you get to Short Stay.  . If you are admitted to the hospital after surgery: o Your blood sugar will be checked by the staff and you will probably be given insulin after surgery (instead of oral diabetes medicines) to make sure you have good blood sugar levels. o The goal for blood sugar control after surgery is 80-180 mg/dL.        WHAT DO I DO ABOUT MY DIABETES MEDICATION?   Marland Kitchen Do not take oral diabetes medicines (pills) the morning of surgery.      Do not wear jewelry, make-up or nail polish.  Do not  wear lotions, powders, or perfumes, or deodorant.  Do not shave 48 hours prior to surgery.  Men may shave face and neck.  Do not bring valuables to the hospital.  [facility] is not responsible for any belongings or valuables.  Contacts, dentures or bridgework may not be worn into surgery.  Leave your suitcase in the car.  After surgery it may be brought to your room.  For patients admitted to the hospital, discharge time will be determined by your treatment team.  Patients discharged the day of surgery will not be allowed to drive home.    Special instructions:   [place]- Preparing For Surgery  Before surgery, you can play an important role. Because skin is not sterile, your skin needs to be as free of germs as possible. You can reduce the number of germs on your skin by washing with CHG (chlorahexidine gluconate) Soap before surgery.  CHG is an antiseptic cleaner which kills germs and bonds with the skin to continue killing germs even after  washing.    Oral Hygiene is also important to reduce your risk of infection.  Remember - BRUSH YOUR TEETH THE MORNING OF SURGERY WITH YOUR REGULAR TOOTHPASTE  Please do not use if you have an allergy to CHG or antibacterial soaps. If your skin becomes reddened/irritated stop using the CHG.  Do not shave (including legs and underarms) for at least 48 hours prior to first CHG shower. It is OK to shave your face.  Please follow these instructions carefully.   1. Shower the NIGHT BEFORE SURGERY and the MORNING OF SURGERY with CHG.   2. If you chose to wash your hair, wash your hair first as usual with your normal shampoo.  3. After you shampoo, rinse your hair and body thoroughly to remove the shampoo.  4. Use CHG as you would any other liquid soap. You can apply CHG directly to the skin and wash gently with a scrungie or a clean washcloth.   5. Apply the CHG Soap to your body ONLY FROM THE NECK DOWN.  Do not use on open wounds or open sores. Avoid contact with your eyes, ears, mouth and genitals (private parts). Wash Face and genitals (private parts)  with your normal soap.  6. Wash thoroughly, paying special attention to the area where your surgery will be performed.  7. Thoroughly rinse your body with warm water from the neck down.  8. DO NOT shower/wash with your normal soap after using and rinsing off the CHG Soap.  9. Pat yourself dry with a CLEAN TOWEL.  10. Wear CLEAN PAJAMAS to bed the night before surgery, wear comfortable clothes the morning of surgery  11. Place CLEAN SHEETS on your bed the night of your first shower and DO NOT SLEEP WITH PETS.    Day of Surgery:  Do not apply any deodorants/lotions.  Please wear clean clothes to the hospital/surgery center.   Remember to brush your teeth WITH YOUR REGULAR TOOTHPASTE.    Please read over the following fact sheets that you were given. Pain Booklet and Surgical Site Infection Prevention

## 2018-04-30 NOTE — H&P (Signed)
Patient's anticipated LOS is less than 2 midnights, meeting these requirements: - Younger than 29 - Lives within 1 hour of care - Has a competent adult at home to recover with post-op recover - NO history of  - Chronic pain requiring opiods  - Diabetes  - Coronary Artery Disease  - Heart failure  - Heart attack  - Stroke  - DVT/VTE  - Cardiac arrhythmia  - Respiratory Failure/COPD  - Renal failure  - Anemia  - Advanced Liver disease       Savannah Lewis is an 77 y.o. female.    Chief Complaint: left shoulder pain  HPI: Pt is a 77 y.o. female complaining of left shoulder pain for multiple years. Pain had continually increased since the beginning. X-rays in the clinic show end-stage arthritic changes of the left shoulder. Pt has tried various conservative treatments which have failed to alleviate their symptoms, including injections and therapy. Various options are discussed with the patient. Risks, benefits and expectations were discussed with the patient. Patient understand the risks, benefits and expectations and wishes to proceed with surgery.   PCP:  Lavone Orn, MD  D/C Plans: Home  PMH: Past Medical History:  Diagnosis Date  . Cervical spondylosis   . DDD (degenerative disc disease), cervical   . De Quervain's tenosynovitis, left    WRIST  . Diabetic retinopathy, nonproliferative ([place])   . Diverticulosis   . DJD (degenerative joint disease)    left knee  . Heart murmur    told a last physical a slight heart murmurper patient  . 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 History of Bell's palsy    2003  RIGHT SIDE-  resolved  . Hypertension   . Thoracic aortic aneurysm [facility])    followed by Dr Tharon Aquas Trigt- LOV- 02/2013 in [facility]   . Thoracic ascending aortic aneurysm ([place])    4.1 CM  STABLE SINCE 2004 PER DR VANTRIGHT NOTE 02/2013  . TMJ (temporomandibular joint syndrome)   . Type 2 diabetes  mellitus ([place])   . Wears glasses     PSH: Past Surgical History:  Procedure Laterality Date  . ABDOMINAL HYSTERECTOMY  1995   W/ UNILATERAL SALPINGOOPHORECTOMY  . COLONOSCOPY N/A 04/14/2013   Procedure: COLONOSCOPY;  Surgeon: Garlan Fair, MD;  [facility];  Service: Endoscopy;  Laterality: N/A;  . DORSAL COMPARTMENT RELEASE Left 09/24/2013   Procedure: LEFT WRIST FIRST DORSAL COMPARTMENT TENOSYNOVITIS;  Surgeon: Linna Hoff, MD;  Location: [place];  Service: Orthopedics;  Laterality: Left;  ANESTHESIA: LOCAL/IV SEDATION  . JOINT REPLACEMENT    . KNEE ARTHROSCOPY Bilateral RIGHT 2002 & 1994/   LEFT  1995  . LAPAROSCOPY ASSISTED  RIGHT COLECTOMY  03-22-2005   CECUM POLYP  . ROTATOR CUFF REPAIR Right 08-31-2008  . TOTAL KNEE ARTHROPLASTY Right 07-01-2009  . TOTAL KNEE ARTHROPLASTY Left 08/31/2014   Procedure: LEFT TOTAL KNEE ARTHROPLASTY;  Surgeon: Sydnee Cabal, MD;  [facility];  Service: Orthopedics;  Laterality: Left;    Social History:  reports that she quit smoking about 58 years ago. Her smoking use included cigarettes. She quit after 2.00 years of use. She has never used smokeless tobacco. She reports that she does not drink alcohol or use drugs.  Allergies:  No Known Allergies  Medications: No current facility-administered medications for this encounter.    Current Outpatient Medications  Medication Sig Dispense  Refill  . amLODipine (NORVASC) 10 MG tablet Take 10 mg by mouth every morning.     . calcium carbonate (OSCAL) 1500 (600 Ca) MG TABS tablet Take 600 mg of elemental calcium by mouth daily with breakfast.     . hydroxypropyl methylcellulose / hypromellose (ISOPTO TEARS / GONIOVISC) 2.5 % ophthalmic solution Place 1 drop into both eyes daily.    . metFORMIN (GLUCOPHAGE) 1000 MG tablet Take 1,000 mg by mouth 2 (two) times daily with a meal.    . Multiple Vitamins-Minerals (MULTIVITAMIN WITH MINERALS) tablet Take 1 tablet by mouth  daily.    Marland Kitchen telmisartan-hydrochlorothiazide (MICARDIS HCT) 80-25 MG tablet Take 1 tablet by mouth daily.    . fluticasone (FLONASE) 50 MCG/ACT nasal spray Place 2 sprays into both nostrils daily. (Patient not taking: Reported on 04/29/2018) 16 g 6  . traMADol (ULTRAM) 50 MG tablet Take 1 tablet (50 mg total) every 6 (six) hours as needed by mouth. (Patient not taking: Reported on 04/29/2018) 15 tablet 0    No results found for this or any previous visit (from the past 48 hour(s)). No results found.  ROS: Pain with rom of the left upper extremity  Physical Exam: Alert and oriented 77 y.o. female in no acute distress Cranial nerves 2-12 intact Cervical spine: full rom with no tenderness, nv intact distally Chest: active breath sounds bilaterally, no wheeze rhonchi or rales Heart: regular rate and rhythm, no murmur Abd: non tender non distended with active bowel sounds Hip is stable with rom  Left shoulder with limited rom and strength due to arthropathy nv intact distally No rashes or edema  Assessment/Plan Assessment: left shoulder cuff arthropathy  Plan:  Patient will undergo a left reverse total shoulder by Dr. Veverly Fells at [facility]. Risks benefits and expectations were discussed with the patient. Patient understand risks, benefits and expectations and wishes to proceed. Preoperative templating of the joint replacement has been completed, documented, and submitted to the Operating Room personnel in order to optimize intra-operative equipment management.   Merla Riches PA-C, MPAS [facility] is now [facility] [address], [place], [place], [place] [address] Phone: [phone] [email or web address]  [employer]

## 2018-05-01 ENCOUNTER — Encounter (HOSPITAL_COMMUNITY)
Admission: RE | Admit: 2018-05-01 | Discharge: 2018-05-01 | Disposition: A | Discharge status: Home / Self Care | Payer: 59 | Source: Ambulatory Visit | Attending: Orthopedic Surgery | Admitting: Orthopedic Surgery

## 2018-05-01 ENCOUNTER — Encounter (HOSPITAL_COMMUNITY): Payer: Self-pay

## 2018-05-01 ENCOUNTER — Other Ambulatory Visit: Payer: Self-pay

## 2018-05-01 DIAGNOSIS — Z01812 Encounter for preprocedural laboratory examination: Secondary | ICD-10-CM | POA: Insufficient documentation

## 2018-05-01 LAB — BASIC METABOLIC PANEL
ANION GAP: 7 (ref 5–15)
BUN: 14 mg/dL (ref 8–23)
CALCIUM: 9.9 mg/dL (ref 8.9–10.3)
CO2: 28 mmol/L (ref 22–32)
Chloride: 107 mmol/L (ref 98–111)
Creatinine, Ser: 0.82 mg/dL (ref 0.44–1.00)
GFR calc Af Amer: >60 mL/min (ref >60)
GLUCOSE: 103 mg/dL — AB (ref 70–99)
Potassium: 4.5 mmol/L (ref 3.5–5.1)
Sodium: 142 mmol/L (ref 135–145)

## 2018-05-01 LAB — CBC
HCT: 37.6 % (ref 36.0–46.0)
Hemoglobin: 11.6 g/dL — ABNORMAL LOW (ref 12.0–15.0)
MCH: 26.3 pg (ref 26.0–34.0)
MCHC: 30.9 g/dL (ref 30.0–36.0)
MCV: 85.3 fL (ref 78.0–100.0)
Platelets: 232 10*3/uL (ref 150–400)
RBC: 4.41 MIL/uL (ref 3.87–5.11)
RDW: 14.7 % (ref 11.5–15.5)
WBC: 3 10*3/uL — ABNORMAL LOW (ref 4.0–10.5)

## 2018-05-01 LAB — HEMOGLOBIN A1C
HEMOGLOBIN A1C: 6.7 % — AB (ref 4.8–5.6)
MEAN PLASMA GLUCOSE: 145.59 mg/dL

## 2018-05-01 LAB — SURGICAL PCR SCREEN
MRSA, PCR: NEGATIVE
STAPHYLOCOCCUS AUREUS: NEGATIVE

## 2018-05-01 LAB — GLUCOSE, CAPILLARY: Glucose-Capillary: 115 mg/dL — ABNORMAL HIGH (ref 70–99)

## 2018-05-01 NOTE — Pre-Procedure Instructions (Addendum)
Savannah Lewis  05/01/2018      [place], [address]. [place]. [place] [address] Phone: [phone] Fax: [phone]    Your procedure is scheduled on Sept. 20  Report to [facility] Admitting at 10:30 A.M.  Call this number if you have problems the morning of surgery:  [phone]   Remember:  Do not eat or drink after midnight.      Take these medicines the morning of surgery with A SIP OF WATER :              Amlodipine (norvasc)             Eye drops                         7 days prior to surgery STOP taking any Aspirin(unless otherwise instructed by your surgeon), Aleve, Naproxen, Ibuprofen, Motrin, Advil, Goody's, BC's, all herbal medications, fish oil, and all vitamins                        How to Manage Your Diabetes Before and After Surgery  Why is it important to control my blood sugar before and after surgery? . Improving blood sugar levels before and after surgery helps healing and can limit problems. . A way of improving blood sugar control is eating a healthy diet by: o  Eating less sugar and carbohydrates o  Increasing activity/exercise o  Talking with your doctor about reaching your blood sugar goals . High blood sugars (greater than 180 mg/dL) can raise your risk of infections and slow your recovery, so you will need to focus on controlling your diabetes during the weeks before surgery. . Make sure that the doctor who takes care of your diabetes knows about your planned surgery including the date and location.  How do I manage my blood sugar before surgery? . Check your blood sugar at least 4 times a day, starting 2 days before surgery, to make sure that the level is not too high or low. o Check your blood sugar the morning of your surgery when you wake up and every 2 hours until you get to the Short Stay unit. . If your blood sugar is less than 70 mg/dL, you will need to  treat for low blood sugar: o Do not take insulin. o Treat a low blood sugar (less than 70 mg/dL) with  cup of clear juice (cranberry or apple), 4 glucose tablets, OR glucose gel. Recheck blood sugar in 15 minutes after treatment (to make sure it is greater than 70 mg/dL). If your blood sugar is not greater than 70 mg/dL on recheck, call [phone] o  for further instructions. . Report your blood sugar to the short stay nurse when you get to Short Stay.  . If you are admitted to the hospital after surgery: o Your blood sugar will be checked by the staff and you will probably be given insulin after surgery (instead of oral diabetes medicines) to make sure you have good blood sugar levels. o The goal for blood sugar control after surgery is 80-180 mg/dL.        WHAT DO I DO ABOUT MY DIABETES MEDICATION?   Marland Kitchen Do not take oral diabetes medicines (pills) the morning of surgery.      Do not wear jewelry, make-up or nail polish.  Do not  wear lotions, powders, or perfumes, or deodorant.  Do not shave 48 hours prior to surgery.  Men may shave face and neck.  Do not bring valuables to the hospital.  [facility] is not responsible for any belongings or valuables.  Contacts, dentures or bridgework may not be worn into surgery.  Leave your suitcase in the car.  After surgery it may be brought to your room.  For patients admitted to the hospital, discharge time will be determined by your treatment team.  Patients discharged the day of surgery will not be allowed to drive home.    Special instructions:   [place]- Preparing For Surgery  Before surgery, you can play an important role. Because skin is not sterile, your skin needs to be as free of germs as possible. You can reduce the number of germs on your skin by washing with CHG (chlorahexidine gluconate) Soap before surgery.  CHG is an antiseptic cleaner which kills germs and bonds with the skin to continue killing germs even after  washing.    Oral Hygiene is also important to reduce your risk of infection.  Remember - BRUSH YOUR TEETH THE MORNING OF SURGERY WITH YOUR REGULAR TOOTHPASTE  Please do not use if you have an allergy to CHG or antibacterial soaps. If your skin becomes reddened/irritated stop using the CHG.  Do not shave (including legs and underarms) for at least 48 hours prior to first CHG shower. It is OK to shave your face.  Please follow these instructions carefully.   1. Shower the NIGHT BEFORE SURGERY and the MORNING OF SURGERY with CHG.   2. If you chose to wash your hair, wash your hair first as usual with your normal shampoo.  3. After you shampoo, rinse your hair and body thoroughly to remove the shampoo.  4. Use CHG as you would any other liquid soap. You can apply CHG directly to the skin and wash gently with a scrungie or a clean washcloth.   5. Apply the CHG Soap to your body ONLY FROM THE NECK DOWN.  Do not use on open wounds or open sores. Avoid contact with your eyes, ears, mouth and genitals (private parts). Wash Face and genitals (private parts)  with your normal soap.  6. Wash thoroughly, paying special attention to the area where your surgery will be performed.  7. Thoroughly rinse your body with warm water from the neck down.  8. DO NOT shower/wash with your normal soap after using and rinsing off the CHG Soap.  9. Pat yourself dry with a CLEAN TOWEL.  10. Wear CLEAN PAJAMAS to bed the night before surgery, wear comfortable clothes the morning of surgery  11. Place CLEAN SHEETS on your bed the night of your first shower and DO NOT SLEEP WITH PETS.    Day of Surgery:  Do not apply any deodorants/lotions.  Please wear clean clothes to the hospital/surgery center.   Remember to brush your teeth WITH YOUR REGULAR TOOTHPASTE.    Please read over the following fact sheets that you were given. Surgical Site Infections, Cough & Deep Breathing, & Mupirocin  Instructions

## 2018-05-01 NOTE — Progress Notes (Signed)
PCP: Lavone Orn  Cardiologist: Denies  DM: Type II (monitored by PCP) Fasting Sugars: 80-90s A1C - 6.2 (per pt - taken early 2019)  Echo: 04-01-18 Stress Test: >5 years Cath: denies  Pt had EKG done at time of physical (early 2019).  Requested records from Dr. Laurann [place] (05-01-18).   Pt denies cough, SOB, fever, and chest pain.  Pt states understanding of instructions given for surgery.

## 2018-05-01 NOTE — Progress Notes (Addendum)
Anesthesia Chart Review:  Case:  [id number] Date/Time:  05/09/18 1215   Procedure:  REVERSE LEFT SHOULDER ARTHROPLASTY (Left Shoulder)   Anesthesia type:  Choice   Pre-op diagnosis:  Left shoulder osteoarthritis   Location:  [facility] OR ROOM 05 / [place] [facility]   Surgeon:  Netta Cedars, MD      DISCUSSION: 77 yo female former smoker for above procedure. Pertinent hx incldues HTN, DMII, Thoracic ascending aortic aneurysm (followed by Dr. Prescott Gum, stable per last note 03/12/2018).   Pt follows with Dr. Prescott Gum for monitoring of thoracic aortic aneurysm. At last visit 03/12/2018 updated MRI reviewed showing it was stable. It was discussed that she was planning for upcoming rotator cuff repair by Dr. Veverly Fells.   Anticipate she can proceed with surgery as planned barring acute status change.  VS: BP (!) 159/73   Pulse 75   Temp 36.6 C   Resp 20   Ht 5\' 2"  (1.575 m)   Wt 59.1 kg   SpO2 100%   BMI 23.83 kg/m   PROVIDERS: Lavone Orn, MD is PCP   LABS: Labs reviewed: Acceptable for surgery. (all labs ordered are listed, but only abnormal results are displayed)  Labs Reviewed  GLUCOSE, CAPILLARY - Abnormal; Notable for the following components:      Result Value   Glucose-Capillary 115 (*)    All other components within normal limits  BASIC METABOLIC PANEL - Abnormal; Notable for the following components:   Glucose, Bld 103 (*)    All other components within normal limits  CBC - Abnormal; Notable for the following components:   WBC 3.0 (*)    Hemoglobin 11.6 (*)    All other components within normal limits  HEMOGLOBIN A1C - Abnormal; Notable for the following components:   Hgb A1c MFr Bld 6.7 (*)    All other components within normal limits  SURGICAL PCR SCREEN     IMAGES: MRA CHEST WITH CONTRAST 03/12/2018  TECHNIQUE: Multiplanar, multiecho pulse sequences of the chest were obtained with intravenous contrast. Angiographic images of chest were obtained using MRA technique with  intravenous contrast.  CONTRAST:  33mL MULTIHANCE GADOBENATE DIMEGLUMINE 529 MG/ML IV SOLN  COMPARISON:  09/09/2016  FINDINGS: There is no evidence of aortic dissection or intramural hematoma. Maximal diameter of the ascending aorta is 3.9 cm compared with 3.9 cm on the prior study. Great vessels are patent. There is no obvious acute pulmonary thromboembolism.  No obvious mediastinal mass effect. Lungs are grossly clear. A dilated right-sided midthoracic nerve root sleeve is stable. Visualized abdomen is benign.  IMPRESSION: Stable ectasia of the ascending aorta at 3.9 cm. There is no evidence of dissection or intramural hematoma.   EKG: Requested, if not received will need DOS. Addendum 05/06/18: EKG recv'd from PCP dated 03/25/18 shows sinus rhythm, low voltage in precordial leads, old inferior infarct. Per Dr. Delene Ruffini note this is unchanged from 2011, but Echo was ordered for preop eval, see results below.  CV: TTE 04/01/2018: Study Conclusions  - Left ventricle: The cavity size was normal. Wall thickness was   increased in a pattern of mild LVH. There was moderate focal   basal hypertrophy of the septum. Systolic function was vigorous.   The estimated ejection fraction was in the range of 65% to 70%.   Wall motion was normal; there were no regional wall motion   abnormalities. Doppler parameters are consistent with abnormal   left ventricular relaxation (grade 1 diastolic dysfunction). - Aortic valve: There was  mild regurgitation.  Impressions:  - Vigorous LV systolic function; mild LVH with proximal septal   thickening; mild diastolic dysfunction; sclerotic aortic valve   with mild AI; mild TR.  Past Medical History:  Diagnosis Date  . Cervical spondylosis   . DDD (degenerative disc disease), cervical   . De Quervain's tenosynovitis, left    WRIST  . Diabetic retinopathy, nonproliferative ([place])   . Diverticulosis   . DJD (degenerative joint disease)     left knee  . Heart murmur    told a last physical a slight heart murmurper patient  . 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 History of Bell's palsy    2003  RIGHT SIDE-  resolved  . Hypertension   . Thoracic aortic aneurysm [facility])    followed by Dr Tharon Aquas Trigt- LOV- 02/2013 in EPIC   . Thoracic ascending aortic aneurysm ([place])    4.1 CM  STABLE SINCE 2004 PER DR VANTRIGHT NOTE 02/2013  . TMJ (temporomandibular joint syndrome)   . Type 2 diabetes mellitus ([place])   . Wears glasses     Past Surgical History:  Procedure Laterality Date  . ABDOMINAL HYSTERECTOMY  1995   W/ UNILATERAL SALPINGOOPHORECTOMY  . COLONOSCOPY N/A 04/14/2013   Procedure: COLONOSCOPY;  Surgeon: Garlan Fair, MD;  Location: [facility];  Service: Endoscopy;  Laterality: N/A;  . DORSAL COMPARTMENT RELEASE Left 09/24/2013   Procedure: LEFT WRIST FIRST DORSAL COMPARTMENT TENOSYNOVITIS;  Surgeon: Linna Hoff, MD;  Location: [place];  Service: Orthopedics;  Laterality: Left;  ANESTHESIA: LOCAL/IV SEDATION  . JOINT REPLACEMENT    . KNEE ARTHROSCOPY Bilateral RIGHT 2002 & 1994/   LEFT  1995  . LAPAROSCOPY ASSISTED  RIGHT COLECTOMY  03-22-2005   CECUM POLYP  . ROTATOR CUFF REPAIR Right 08-31-2008  . TOTAL KNEE ARTHROPLASTY Right 07-01-2009  . TOTAL KNEE ARTHROPLASTY Left 08/31/2014   Procedure: LEFT TOTAL KNEE ARTHROPLASTY;  Surgeon: Sydnee Cabal, MD;  Location: [facility] ORS;  Service: Orthopedics;  Laterality: Left;    MEDICATIONS: . amLODipine (NORVASC) 10 MG tablet  . calcium carbonate (OSCAL) 1500 (600 Ca) MG TABS tablet  . fluticasone (FLONASE) 50 MCG/ACT nasal spray  . hydroxypropyl methylcellulose / hypromellose (ISOPTO TEARS / GONIOVISC) 2.5 % ophthalmic solution  . metFORMIN (GLUCOPHAGE) 1000 MG tablet  . Multiple Vitamins-Minerals (MULTIVITAMIN WITH MINERALS) tablet  . telmisartan-hydrochlorothiazide (MICARDIS HCT) 80-25 MG  tablet  . traMADol (ULTRAM) 50 MG tablet   No current facility-administered medications for this encounter.     Wynonia Musty [facility] Short Stay Center/Anesthesiology Phone [phone] 05/01/2018 3:07 PM

## 2018-05-09 ENCOUNTER — Inpatient Hospital Stay (HOSPITAL_COMMUNITY): Payer: 59

## 2018-05-09 ENCOUNTER — Inpatient Hospital Stay (HOSPITAL_COMMUNITY): Payer: 59 | Admitting: Anesthesiology

## 2018-05-09 ENCOUNTER — Encounter (HOSPITAL_COMMUNITY)
Admission: RE | Disposition: A | Discharge status: Home / Self Care | Payer: Self-pay | Source: Ambulatory Visit | Attending: Orthopedic Surgery

## 2018-05-09 ENCOUNTER — Encounter (HOSPITAL_COMMUNITY): Payer: Self-pay

## 2018-05-09 ENCOUNTER — Inpatient Hospital Stay (HOSPITAL_COMMUNITY): Payer: 59 | Admitting: Physician Assistant

## 2018-05-09 ENCOUNTER — Inpatient Hospital Stay (HOSPITAL_COMMUNITY)
Admission: RE | Admit: 2018-05-09 | Discharge: 2018-05-10 | DRG: 483 | Disposition: A | Discharge status: Home / Self Care | Payer: 59 | Source: Ambulatory Visit | Attending: Orthopedic Surgery | Admitting: Orthopedic Surgery

## 2018-05-09 DIAGNOSIS — M19012 Primary osteoarthritis, left shoulder: Secondary | ICD-10-CM | POA: Diagnosis not present

## 2018-05-09 DIAGNOSIS — Z7984 Long term (current) use of oral hypoglycemic drugs: Secondary | ICD-10-CM

## 2018-05-09 DIAGNOSIS — M75102 Unspecified rotator cuff tear or rupture of left shoulder, not specified as traumatic: Secondary | ICD-10-CM | POA: Diagnosis not present

## 2018-05-09 DIAGNOSIS — M25812 Other specified joint disorders, left shoulder: Secondary | ICD-10-CM | POA: Diagnosis not present

## 2018-05-09 DIAGNOSIS — Z96612 Presence of left artificial shoulder joint: Secondary | ICD-10-CM

## 2018-05-09 DIAGNOSIS — I10 Essential (primary) hypertension: Secondary | ICD-10-CM | POA: Diagnosis not present

## 2018-05-09 DIAGNOSIS — G8918 Other acute postprocedural pain: Secondary | ICD-10-CM | POA: Diagnosis not present

## 2018-05-09 DIAGNOSIS — Z96653 Presence of artificial knee joint, bilateral: Secondary | ICD-10-CM | POA: Diagnosis present

## 2018-05-09 DIAGNOSIS — Z471 Aftercare following joint replacement surgery: Secondary | ICD-10-CM | POA: Diagnosis not present

## 2018-05-09 DIAGNOSIS — E113299 Type 2 diabetes mellitus with mild nonproliferative diabetic retinopathy without macular edema, unspecified eye: Secondary | ICD-10-CM | POA: Diagnosis present

## 2018-05-09 HISTORY — PX: REVERSE SHOULDER ARTHROPLASTY: SHX5054

## 2018-05-09 LAB — GLUCOSE, CAPILLARY
GLUCOSE-CAPILLARY: 85 mg/dL (ref 70–99)
Glucose-Capillary: 86 mg/dL (ref 70–99)
Glucose-Capillary: 123 mg/dL — ABNORMAL HIGH (ref 70–99)
Glucose-Capillary: 153 mg/dL — ABNORMAL HIGH (ref 70–99)

## 2018-05-09 SURGERY — ARTHROPLASTY, SHOULDER, TOTAL, REVERSE
Anesthesia: Regional | Site: Shoulder | Laterality: Left

## 2018-05-09 MED ORDER — ONDANSETRON HCL 4 MG PO TABS: 4.0000 mg | ORAL_TABLET | Freq: Four times a day (QID) | ORAL | Status: DC | PRN

## 2018-05-09 MED ORDER — METOCLOPRAMIDE HCL 5 MG/ML IJ SOLN: 5.0000 mg | Freq: Three times a day (TID) | INTRAMUSCULAR | Status: DC | PRN

## 2018-05-09 MED ORDER — FENTANYL CITRATE (PF) 250 MCG/5ML IJ SOLN
INTRAMUSCULAR | Status: AC
  Filled 2018-05-09: qty 5

## 2018-05-09 MED ORDER — AMLODIPINE BESYLATE 5 MG PO TABS: 10.0000 mg | ORAL_TABLET | Freq: Every morning | ORAL | Status: DC

## 2018-05-09 MED ORDER — HYDROCHLOROTHIAZIDE 25 MG PO TABS
25.0000 mg | ORAL_TABLET | Freq: Every day | ORAL | Status: DC
  Administered 2018-05-09: 25 mg via ORAL
  Filled 2018-05-09: qty 1

## 2018-05-09 MED ORDER — ONDANSETRON HCL 4 MG/2ML IJ SOLN
4.0000 mg | Freq: Four times a day (QID) | INTRAMUSCULAR | Status: DC | PRN
  Administered 2018-05-10: 4 mg via INTRAVENOUS
  Filled 2018-05-09: qty 2

## 2018-05-09 MED ORDER — SODIUM CHLORIDE 0.9 % IV SOLN: INTRAVENOUS | Status: DC

## 2018-05-09 MED ORDER — BISACODYL 10 MG RE SUPP: 10.0000 mg | Freq: Every day | RECTAL | Status: DC | PRN

## 2018-05-09 MED ORDER — METHOCARBAMOL 1000 MG/10ML IJ SOLN
500.0000 mg | Freq: Four times a day (QID) | INTRAVENOUS | Status: DC | PRN
  Filled 2018-05-09: qty 5

## 2018-05-09 MED ORDER — FENTANYL CITRATE (PF) 100 MCG/2ML IJ SOLN
50.0000 ug | Freq: Once | INTRAMUSCULAR | Status: AC
  Administered 2018-05-09: 50 ug via INTRAVENOUS

## 2018-05-09 MED ORDER — METOCLOPRAMIDE HCL 5 MG PO TABS: 5.0000 mg | ORAL_TABLET | Freq: Three times a day (TID) | ORAL | Status: DC | PRN

## 2018-05-09 MED ORDER — ONDANSETRON HCL 4 MG/2ML IJ SOLN
INTRAMUSCULAR | Status: AC
  Filled 2018-05-09: qty 2

## 2018-05-09 MED ORDER — 0.9 % SODIUM CHLORIDE (POUR BTL) OPTIME
TOPICAL | Status: DC | PRN
  Administered 2018-05-09: 1000 mL

## 2018-05-09 MED ORDER — BUPIVACAINE-EPINEPHRINE 0.25% -1:200000 IJ SOLN
INTRAMUSCULAR | Status: DC | PRN
  Administered 2018-05-09: 8 mL

## 2018-05-09 MED ORDER — IRBESARTAN 300 MG PO TABS
300.0000 mg | ORAL_TABLET | Freq: Every day | ORAL | Status: DC
  Administered 2018-05-09: 300 mg via ORAL
  Filled 2018-05-09 (×3): qty 1

## 2018-05-09 MED ORDER — MIDAZOLAM HCL 2 MG/2ML IJ SOLN
INTRAMUSCULAR | Status: AC
  Administered 2018-05-09: 1 mg via INTRAVENOUS
  Filled 2018-05-09: qty 2

## 2018-05-09 MED ORDER — INSULIN ASPART 100 UNIT/ML ~~LOC~~ SOLN
0.0000 [IU] | Freq: Three times a day (TID) | SUBCUTANEOUS | Status: DC
  Administered 2018-05-10: 1 [IU] via SUBCUTANEOUS

## 2018-05-09 MED ORDER — POLYETHYLENE GLYCOL 3350 17 G PO PACK: 17.0000 g | PACK | Freq: Every day | ORAL | Status: DC | PRN

## 2018-05-09 MED ORDER — SODIUM CHLORIDE 0.9 % IV SOLN
INTRAVENOUS | Status: DC | PRN
  Administered 2018-05-09: 50 ug/min via INTRAVENOUS

## 2018-05-09 MED ORDER — TRAMADOL HCL 50 MG PO TABS: 50.0000 mg | ORAL_TABLET | Freq: Four times a day (QID) | ORAL | 0 refills | Status: AC | PRN

## 2018-05-09 MED ORDER — HYPROMELLOSE (GONIOSCOPIC) 2.5 % OP SOLN
1.0000 [drp] | Freq: Every day | OPHTHALMIC | Status: DC
  Filled 2018-05-09: qty 15

## 2018-05-09 MED ORDER — CEFAZOLIN SODIUM-DEXTROSE 2-4 GM/100ML-% IV SOLN
2.0000 g | INTRAVENOUS | Status: AC
  Administered 2018-05-09: 2 g via INTRAVENOUS

## 2018-05-09 MED ORDER — FENTANYL CITRATE (PF) 100 MCG/2ML IJ SOLN
INTRAMUSCULAR | Status: AC
  Administered 2018-05-09: 50 ug via INTRAVENOUS
  Filled 2018-05-09: qty 2

## 2018-05-09 MED ORDER — MORPHINE SULFATE (PF) 2 MG/ML IV SOLN: 0.5000 mg | INTRAVENOUS | Status: DC | PRN

## 2018-05-09 MED ORDER — ROCURONIUM BROMIDE 50 MG/5ML IV SOSY
PREFILLED_SYRINGE | INTRAVENOUS | Status: DC | PRN
  Administered 2018-05-09: 30 mg via INTRAVENOUS

## 2018-05-09 MED ORDER — ROCURONIUM BROMIDE 50 MG/5ML IV SOSY
PREFILLED_SYRINGE | INTRAVENOUS | Status: AC
  Filled 2018-05-09: qty 5

## 2018-05-09 MED ORDER — INSULIN ASPART 100 UNIT/ML ~~LOC~~ SOLN
3.0000 [IU] | Freq: Three times a day (TID) | SUBCUTANEOUS | Status: DC
  Administered 2018-05-10: 3 [IU] via SUBCUTANEOUS

## 2018-05-09 MED ORDER — LIDOCAINE 2% (20 MG/ML) 5 ML SYRINGE
INTRAMUSCULAR | Status: AC
  Filled 2018-05-09: qty 5

## 2018-05-09 MED ORDER — PROPOFOL 10 MG/ML IV BOLUS
INTRAVENOUS | Status: AC
  Filled 2018-05-09: qty 20

## 2018-05-09 MED ORDER — TRAMADOL HCL 50 MG PO TABS
50.0000 mg | ORAL_TABLET | Freq: Four times a day (QID) | ORAL | Status: DC
  Administered 2018-05-09 – 2018-05-10 (×3): 50 mg via ORAL
  Filled 2018-05-09 (×3): qty 1

## 2018-05-09 MED ORDER — HYDROCODONE-ACETAMINOPHEN 5-325 MG PO TABS
1.0000 | ORAL_TABLET | ORAL | Status: DC | PRN
  Administered 2018-05-10: 1 via ORAL
  Filled 2018-05-09: qty 1

## 2018-05-09 MED ORDER — ACETAMINOPHEN 325 MG PO TABS: 325.0000 mg | ORAL_TABLET | Freq: Four times a day (QID) | ORAL | Status: DC | PRN

## 2018-05-09 MED ORDER — PHENOL 1.4 % MT LIQD: 1.0000 | OROMUCOSAL | Status: DC | PRN

## 2018-05-09 MED ORDER — BUPIVACAINE HCL (PF) 0.5 % IJ SOLN
INTRAMUSCULAR | Status: DC | PRN
  Administered 2018-05-09: 10 mL

## 2018-05-09 MED ORDER — CEFAZOLIN SODIUM-DEXTROSE 2-4 GM/100ML-% IV SOLN
2.0000 g | Freq: Four times a day (QID) | INTRAVENOUS | Status: DC
  Administered 2018-05-09 – 2018-05-10 (×2): 2 g via INTRAVENOUS
  Filled 2018-05-09 (×2): qty 100

## 2018-05-09 MED ORDER — LIDOCAINE 2% (20 MG/ML) 5 ML SYRINGE
INTRAMUSCULAR | Status: DC | PRN
  Administered 2018-05-09: 60 mg via INTRAVENOUS

## 2018-05-09 MED ORDER — FENTANYL CITRATE (PF) 100 MCG/2ML IJ SOLN
INTRAMUSCULAR | Status: DC | PRN
  Administered 2018-05-09: 100 ug via INTRAVENOUS

## 2018-05-09 MED ORDER — MENTHOL 3 MG MT LOZG: 1.0000 | LOZENGE | OROMUCOSAL | Status: DC | PRN

## 2018-05-09 MED ORDER — DEXAMETHASONE SODIUM PHOSPHATE 10 MG/ML IJ SOLN
INTRAMUSCULAR | Status: DC | PRN
  Administered 2018-05-09: 5 mg via INTRAVENOUS

## 2018-05-09 MED ORDER — SUGAMMADEX SODIUM 200 MG/2ML IV SOLN
INTRAVENOUS | Status: DC | PRN
  Administered 2018-05-09: 200 mg via INTRAVENOUS

## 2018-05-09 MED ORDER — LACTATED RINGERS IV SOLN
INTRAVENOUS | Status: DC
  Administered 2018-05-09: 11:00:00 via INTRAVENOUS

## 2018-05-09 MED ORDER — METHOCARBAMOL 500 MG PO TABS
500.0000 mg | ORAL_TABLET | Freq: Four times a day (QID) | ORAL | Status: DC | PRN
  Administered 2018-05-09 – 2018-05-10 (×2): 500 mg via ORAL
  Filled 2018-05-09 (×2): qty 1

## 2018-05-09 MED ORDER — BUPIVACAINE LIPOSOME 1.3 % IJ SUSP
INTRAMUSCULAR | Status: DC | PRN
  Administered 2018-05-09: 10 mL

## 2018-05-09 MED ORDER — INSULIN ASPART 100 UNIT/ML ~~LOC~~ SOLN: 0.0000 [IU] | Freq: Every day | SUBCUTANEOUS | Status: DC

## 2018-05-09 MED ORDER — METFORMIN HCL 500 MG PO TABS
1000.0000 mg | ORAL_TABLET | Freq: Two times a day (BID) | ORAL | Status: DC
  Administered 2018-05-09 – 2018-05-10 (×2): 1000 mg via ORAL
  Filled 2018-05-09 (×2): qty 2

## 2018-05-09 MED ORDER — ONDANSETRON HCL 4 MG/2ML IJ SOLN
INTRAMUSCULAR | Status: DC | PRN
  Administered 2018-05-09: 4 mg via INTRAVENOUS

## 2018-05-09 MED ORDER — ADULT MULTIVITAMIN W/MINERALS CH: 1.0000 | ORAL_TABLET | Freq: Every day | ORAL | Status: DC

## 2018-05-09 MED ORDER — BUPIVACAINE-EPINEPHRINE (PF) 0.25% -1:200000 IJ SOLN
INTRAMUSCULAR | Status: AC
  Filled 2018-05-09: qty 30

## 2018-05-09 MED ORDER — CALCIUM CARBONATE 1500 (600 CA) MG PO TABS: 600.0000 mg | ORAL_TABLET | Freq: Every day | ORAL | Status: DC

## 2018-05-09 MED ORDER — DOCUSATE SODIUM 100 MG PO CAPS
100.0000 mg | ORAL_CAPSULE | Freq: Two times a day (BID) | ORAL | Status: DC
  Administered 2018-05-09: 100 mg via ORAL
  Filled 2018-05-09: qty 1

## 2018-05-09 MED ORDER — PROPOFOL 10 MG/ML IV BOLUS
INTRAVENOUS | Status: DC | PRN
  Administered 2018-05-09: 120 mg via INTRAVENOUS

## 2018-05-09 MED ORDER — MIDAZOLAM HCL 2 MG/2ML IJ SOLN
1.0000 mg | Freq: Once | INTRAMUSCULAR | Status: AC
  Administered 2018-05-09: 1 mg via INTRAVENOUS

## 2018-05-09 MED ORDER — CHLORHEXIDINE GLUCONATE 4 % EX LIQD: 60.0000 mL | Freq: Once | CUTANEOUS | Status: DC

## 2018-05-09 MED ORDER — CEFAZOLIN SODIUM-DEXTROSE 2-4 GM/100ML-% IV SOLN
INTRAVENOUS | Status: AC
  Filled 2018-05-09: qty 100

## 2018-05-09 MED ORDER — TELMISARTAN-HCTZ 80-25 MG PO TABS: 1.0000 | ORAL_TABLET | Freq: Every day | ORAL | Status: DC

## 2018-05-09 SURGICAL SUPPLY — 71 items
BIT DRILL 170X2.5X (BIT) ×1 IMPLANT
BIT DRILL 5/64X5 DISP (BIT) ×2 IMPLANT
BIT DRL 170X2.5X (BIT) ×1
BLADE SAG 18X100X1.27 (BLADE) ×2 IMPLANT
CEMENT HV SMART SET (Cement) ×2 IMPLANT
CLSR STERI-STRIP ANTIMIC 1/2X4 (GAUZE/BANDAGES/DRESSINGS) ×2 IMPLANT
COVER SURGICAL LIGHT HANDLE (MISCELLANEOUS) ×2 IMPLANT
DRAPE INCISE IOBAN 66X45 STRL (DRAPES) ×2 IMPLANT
DRAPE ORTHO SPLIT 77X108 STRL (DRAPES) ×2
DRAPE SURG ORHT 6 SPLT 77X108 (DRAPES) ×2 IMPLANT
DRAPE U-SHAPE 47X51 STRL (DRAPES) ×2 IMPLANT
DRILL 2.5 (BIT) ×1
DRSG ADAPTIC 3X8 NADH LF (GAUZE/BANDAGES/DRESSINGS) ×2 IMPLANT
DRSG PAD ABDOMINAL 8X10 ST (GAUZE/BANDAGES/DRESSINGS) ×2 IMPLANT
DURAPREP 26ML APPLICATOR (WOUND CARE) ×2 IMPLANT
ELECT BLADE 4.0 EZ CLEAN MEGAD (MISCELLANEOUS) ×2
ELECT NEEDLE TIP 2.8 STRL (NEEDLE) ×2 IMPLANT
ELECT REM PT RETURN 9FT ADLT (ELECTROSURGICAL) ×2
ELECTRODE BLDE 4.0 EZ CLN MEGD (MISCELLANEOUS) ×1 IMPLANT
ELECTRODE REM PT RTRN 9FT ADLT (ELECTROSURGICAL) ×1 IMPLANT
EPI LT SZ 1 (Orthopedic Implant) ×2 IMPLANT
EPIPHYSIS LT SZ 1 (Orthopedic Implant) ×1 IMPLANT
GAUZE SPONGE 4X4 12PLY STRL (GAUZE/BANDAGES/DRESSINGS) ×2 IMPLANT
GLENOSPHERE DXTEND STD 38 (Orthopedic Implant) ×1 IMPLANT
GLENSOPHERE DXTEND STD 38 (Orthopedic Implant) ×2 IMPLANT
GLOVE BIOGEL PI ORTHO PRO 7.5 (GLOVE) ×1
GLOVE BIOGEL PI ORTHO PRO SZ8 (GLOVE) ×1
GLOVE ORTHO TXT STRL SZ7.5 (GLOVE) ×2 IMPLANT
GLOVE PI ORTHO PRO STRL 7.5 (GLOVE) ×1 IMPLANT
GLOVE PI ORTHO PRO STRL SZ8 (GLOVE) ×1 IMPLANT
GLOVE SURG ORTHO 8.5 STRL (GLOVE) ×2 IMPLANT
GOWN STRL REUS W/ TWL LRG LVL3 (GOWN DISPOSABLE) ×2 IMPLANT
GOWN STRL REUS W/ TWL XL LVL3 (GOWN DISPOSABLE) ×2 IMPLANT
GOWN STRL REUS W/TWL LRG LVL3 (GOWN DISPOSABLE) ×2
GOWN STRL REUS W/TWL XL LVL3 (GOWN DISPOSABLE) ×4
KIT BASIN OR (CUSTOM PROCEDURE TRAY) ×2 IMPLANT
KIT TURNOVER KIT B (KITS) ×2 IMPLANT
MANIFOLD NEPTUNE II (INSTRUMENTS) ×2 IMPLANT
METAGLENE DELTA EXTEND (Trauma) ×1 IMPLANT
METAGLENE DXTEND (Trauma) ×2 IMPLANT
NEEDLE 1/2 CIR MAYO (NEEDLE) ×2 IMPLANT
NEEDLE HYPO 25GX1X1/2 BEV (NEEDLE) ×2 IMPLANT
NS IRRIG 1000ML POUR BTL (IV SOLUTION) ×2 IMPLANT
PACK SHOULDER (CUSTOM PROCEDURE TRAY) ×2 IMPLANT
PAD ARMBOARD 7.5X6 YLW CONV (MISCELLANEOUS) ×4 IMPLANT
PIN GUIDE GLENOPHERE 1.5MX300M (PIN) ×2 IMPLANT
PIN METAGLENE 2.5 (PIN) ×2 IMPLANT
RESTRAINT HEAD UNIVERSAL NS (MISCELLANEOUS) ×2 IMPLANT
SCREW 4.5X18MM (Screw) ×2 IMPLANT
SCREW 4.5X36MM (Screw) ×2 IMPLANT
SCREW 48L (Screw) ×2 IMPLANT
SCREW BN 18X4.5XSTRL SHLDR (Screw) ×1 IMPLANT
SLING ARM FOAM STRAP LRG (SOFTGOODS) ×2 IMPLANT
SPACER 38 PLUS 3 (Spacer) ×2 IMPLANT
SPONGE LAP 18X18 X RAY DECT (DISPOSABLE) IMPLANT
SPONGE LAP 4X18 RFD (DISPOSABLE) ×2 IMPLANT
STEM DELTA DIA 10 HA (Stem) ×2 IMPLANT
STRIP CLOSURE SKIN 1/2X4 (GAUZE/BANDAGES/DRESSINGS) ×2 IMPLANT
SUCTION FRAZIER HANDLE 10FR (MISCELLANEOUS) ×1
SUCTION TUBE FRAZIER 10FR DISP (MISCELLANEOUS) ×1 IMPLANT
SUT FIBERWIRE #2 38 T-5 BLUE (SUTURE)
SUT MNCRL AB 4-0 PS2 18 (SUTURE) ×2 IMPLANT
SUT VIC AB 0 CT2 27 (SUTURE) ×2 IMPLANT
SUT VIC AB 2-0 CT1 27 (SUTURE) ×1
SUT VIC AB 2-0 CT1 TAPERPNT 27 (SUTURE) ×1 IMPLANT
SUT VICRYL 0 CT 1 36IN (SUTURE) ×2 IMPLANT
SUTURE FIBERWR #2 38 T-5 BLUE (SUTURE) IMPLANT
SYR CONTROL 10ML LL (SYRINGE) ×2 IMPLANT
TOWEL OR 17X26 10 PK STRL BLUE (TOWEL DISPOSABLE) ×2 IMPLANT
TOWER CARTRIDGE SMART MIX (DISPOSABLE) IMPLANT
YANKAUER SUCT BULB TIP NO VENT (SUCTIONS) ×2 IMPLANT

## 2018-05-09 NOTE — Transfer of Care (Signed)
Immediate Anesthesia Transfer of Care Note  Patient: Savannah Lewis  Procedure(s) Performed: REVERSE LEFT SHOULDER ARTHROPLASTY (Left Shoulder)  Patient Location: PACU  Anesthesia Type:GA combined with regional for post-op pain  Level of Consciousness: awake, alert  and oriented  Airway & Oxygen Therapy: Patient Spontanous Breathing and Patient connected to nasal cannula oxygen  Post-op Assessment: Report given to RN, Post -op Vital signs reviewed and stable and Patient moving all extremities  Post vital signs: Reviewed and stable  Last Vitals:  Vitals Value Taken Time  BP 140/67 05/09/2018  2:55 PM  Temp 36.5 C 05/09/2018  2:55 PM  Pulse 81 05/09/2018  2:57 PM  Resp 15 05/09/2018  2:57 PM  SpO2 94 % 05/09/2018  2:57 PM  Vitals shown include unvalidated device data.  Last Pain:  Vitals:   05/09/18 1455  TempSrc:   PainSc: Asleep      Patients Stated Pain Goal: 2 (74/[id number])  Complications: No apparent anesthesia complications

## 2018-05-09 NOTE — Anesthesia Procedure Notes (Signed)
Procedure Name: Intubation Date/Time: 05/09/2018 1:16 PM Performed by: Kyung Rudd, CRNA Pre-anesthesia Checklist: Patient identified, Emergency Drugs available, Suction available and Patient being monitored Patient Re-evaluated:Patient Re-evaluated prior to induction Oxygen Delivery Method: Circle system utilized Preoxygenation: Pre-oxygenation with 100% oxygen Induction Type: IV induction Ventilation: Mask ventilation without difficulty Laryngoscope Size: Mac and 4 Grade View: Grade I Tube type: Oral Tube size: 7.0 mm Number of attempts: 1 Airway Equipment and Method: Stylet Placement Confirmation: ETT inserted through vocal cords under direct vision,  positive ETCO2 and breath sounds checked- equal and bilateral Secured at: 20 cm Tube secured with: Tape Dental Injury: Teeth and Oropharynx as per pre-operative assessment

## 2018-05-09 NOTE — Anesthesia Procedure Notes (Addendum)
Anesthesia Regional Block: Interscalene brachial plexus block   Pre-Anesthetic Checklist: ,, timeout performed, Correct Patient, Correct Site, Correct Laterality, Correct Procedure, Correct Position, site marked, Risks and benefits discussed,  Surgical consent,  Pre-op evaluation,  At surgeon's request and post-op pain management  Laterality: Left  Prep: chloraprep       Needles:  Injection technique: Single-shot  Needle Type: Echogenic Stimulator Needle     Needle Length: 10cm  Needle Gauge: 21     Additional Needles:   Procedures:,,,, ultrasound used (permanent image in chart),,,,  Narrative:  Start time: 05/09/2018 12:00 PM End time: 05/09/2018 12:09 PM Injection made incrementally with aspirations every 5 mL.  Performed by: Personally  Anesthesiologist: Lidia Collum, MD

## 2018-05-09 NOTE — Brief Op Note (Signed)
05/09/2018  2:50 PM  PATIENT:  Savannah Lewis  77 y.o. female  PRE-OPERATIVE DIAGNOSIS:  Left shoulder rotator cuff tear arthropathy  POST-OPERATIVE DIAGNOSIS:  Left shoulder osteoarthritis  rotator cuff tear arthropathy  PROCEDURE:  Procedure(s): REVERSE LEFT SHOULDER ARTHROPLASTY (Left) DePuy Delta Xtend  SURGEON:  Surgeon(s) and Role:    Netta Cedars, MD - Primary  PHYSICIAN ASSISTANT:   ASSISTANTS: [place] Bruns, PA-C   ANESTHESIA:   regional and general  EBL: 150 cc   BLOOD ADMINISTERED:none  DRAINS: none   LOCAL MEDICATIONS USED:  MARCAINE     SPECIMEN:  No Specimen  DISPOSITION OF SPECIMEN:  N/A  COUNTS:  YES  TOURNIQUET:  * No tourniquets in log *  DICTATION: .Other Dictation: Dictation Number [id number]  PLAN OF CARE: Admit to inpatient   PATIENT DISPOSITION:  PACU - hemodynamically stable.   Delay start of Pharmacological VTE agent (>24hrs) due to surgical blood loss or risk of bleeding: not applicable

## 2018-05-09 NOTE — Discharge Instructions (Signed)
Keep pillow propped behind the left elbow to keep your arm across your waist.   Do exercises as instructed.  Do not reach behind your back or push your full weight out of a chair.  Follow up in two weeks in the office. No driving.   Call office at [phone] for appt.

## 2018-05-09 NOTE — Interval H&P Note (Signed)
History and Physical Interval Note:  05/09/2018 12:33 PM  Savannah Lewis  has presented today for surgery, with the diagnosis of Left shoulder osteoarthritis  The various methods of treatment have been discussed with the patient and family. After consideration of risks, benefits and other options for treatment, the patient has consented to  Procedure(s): REVERSE LEFT SHOULDER ARTHROPLASTY (Left) as a surgical intervention .  The patient's history has been reviewed, patient examined, no change in status, stable for surgery.  I have reviewed the patient's chart and labs.  Questions were answered to the patient's satisfaction.     Sheranda Seabrooks,STEVEN R

## 2018-05-09 NOTE — Anesthesia Preprocedure Evaluation (Addendum)
Anesthesia Evaluation  Patient identified by MRN, date of birth, ID band Patient awake    Reviewed: Allergy & Precautions, NPO status , Patient's Chart, lab work & pertinent test results  History of Anesthesia Complications Negative for: history of anesthetic complications  Airway Mallampati: II  TM Distance: >3 FB Neck ROM: Full    Dental  (+) Partial Upper, Dental Advisory Given   Pulmonary neg pulmonary ROS, former smoker,    Pulmonary exam normal        Cardiovascular hypertension, + Peripheral Vascular Disease  Normal cardiovascular exam+ Valvular Problems/Murmurs AI   TTE 04/01/18: Study Conclusions - Left ventricle: The cavity size was normal. Wall thickness was   increased in a pattern of mild LVH. There was moderate focal   basal hypertrophy of the septum. Systolic function was vigorous.   The estimated ejection fraction was in the range of 65% to 70%.   Wall motion was normal; there were no regional wall motion   abnormalities. Doppler parameters are consistent with abnormal   left ventricular relaxation (grade 1 diastolic dysfunction). - Aortic valve: There was mild regurgitation.  Impressions: - Vigorous LV systolic function; mild LVH with proximal septal   thickening; mild diastolic dysfunction; sclerotic aortic valve   with mild AI; mild TR.   Neuro/Psych negative neurological ROS  negative psych ROS   GI/Hepatic negative GI ROS, Neg liver ROS,   Endo/Other  diabetes, Well Controlled  Renal/GU negative Renal ROS  negative genitourinary   Musculoskeletal  (+) Arthritis ,   Abdominal   Peds  Hematology negative hematology ROS (+)   Anesthesia Other Findings   Reproductive/Obstetrics                           Anesthesia Physical Anesthesia Plan  ASA: II  Anesthesia Plan: General   Post-op Pain Management: GA combined w/ Regional for post-op pain   Induction:    PONV Risk Score and Plan: 3 and Ondansetron, Dexamethasone and Treatment may vary due to age or medical condition  Airway Management Planned: Oral ETT  Additional Equipment:   Intra-op Plan:   Post-operative Plan: Extubation in OR  Informed Consent: I have reviewed the patients History and Physical, chart, labs and discussed the procedure including the risks, benefits and alternatives for the proposed anesthesia with the patient or authorized representative who has indicated his/her understanding and acceptance.     Plan Discussed with:   Anesthesia Plan Comments:         Anesthesia Quick Evaluation

## 2018-05-09 NOTE — Op Note (Signed)
NAME: Savannah Lewis, Savannah Lewis. MEDICAL RECORD DJ:[id number] ACCOUNT [id number] DATE OF BIRTH:29-Jan-1941 FACILITY: [facility] LOCATION: [facility] PHYSICIAN:STEVEN Orlena Sheldon, MD  OPERATIVE REPORT  DATE OF PROCEDURE:  05/09/2018  PREOPERATIVE DIAGNOSIS:  Left shoulder rotator cuff tear arthropathy.  POSTOPERATIVE DIAGNOSIS:  Left shoulder rotator cuff tear arthropathy.  PROCEDURE PERFORMED:  Left reverse total shoulder replacement using DePuy Delta Xtend prosthesis.  ATTENDING SURGEON:  Esmond Plants, MD  ASSISTANT:  Darol Destine, [place], who was scrubbed during the entire procedure and necessary for satisfactory completion of surgery.  ANESTHESIA:  General anesthesia was used plus interscalene block.  ESTIMATED BLOOD LOSS:  150 mL  FLUID REPLACEMENT:  1500 mL crystalloid.  INSTRUMENT COUNTS:  Correct.  COMPLICATIONS:  No complications.  ANTIBIOTICS:  Perioperative antibiotics were given.  INDICATIONS:  The patient is a 78 year old female with worsening left shoulder pain and dysfunction secondary to rotator cuff tear arthropathy.  The patient has failed an extended period of conservative management and desires operative treatment to  relieve pain and restore function of her shoulder.  Informed consent obtained.  DESCRIPTION OF PROCEDURE:  After an adequate level of anesthesia was achieved, the patient was positioned in the modified beach chair position.  Left shoulder correctly identified and sterilely prepped and draped in the usual manner.  Time-out called,  verifying correct patient, correct site.  We then entered the shoulder using standard deltopectoral incision.  We started at the coracoid process extending down to the anterior humerus.  Dissection down through subcutaneous tissues using needle tip  Bovie.  We identified cephalic vein, took it laterally with the deltoid pectoralis taken medially.  Conjoined tendon identified and retracted medially.  Biceps was tenodesed in situ using  figure-of-eight sutures of 0 Vicryl x2.  Next, we released the  subscap remnant.  This was in poor condition and not amenable to repair.  We tagged it with #2 FiberWire to retract.  We then released the inferior capsule progressively externally rotating and extending the shoulder and exposing the entire humeral head.   Supraspinatus, infraspinatus, teres minor was diminutive but basically intact.  We went ahead and entered the proximal humerus with a 6 mm reamer, reamed up to size 10.  We placed our 10 mm head resection guide and resected the head 10 degrees of  retroversion with an oscillating saw.  We removed excess osteophytes with a rongeur.  We then reduced the humerus back into the wound and retracted that posteriorly.  We gained 360 degree exposure of the glenoid face.  We removed the capsule and the  labrum.  We were careful to protect the axillary nerve.  We found the center point for the glenoid and drilled our guide pin.  We then reamed for the metaglene baseplate and then drilled our central peg hole and impacted the metaglene baseplate careful  with that inferior screw hole at the 6 o'clock position referencing off the scapular neck and body.  We placed a 48 screw locked inferiorly, a 36 locked to the base of the coracoid and an 18 nonlocked anteriorly for great baseplate fixation.  We  irrigated thoroughly and placed a 38 standard glenosphere into position and screwed that onto the baseplate.  Next, we went ahead and finished our preparation on the humeral side.  We used the reamer to ream for the metaphyseal portion.  We sized this to  a size 1 and it was a slightly eccentric so we used the EPI-1 left and set on the 0 setting and placed in  10 degrees of retroversion on the stem.  We impacted that into position.  We reduced the shoulder to 38+3 poly and we were happy with that soft  tissue balancing and range of motion with no impingement.  We removed the trial components from the humeral side.   We irrigated thoroughly.  We used a small amount of cement in a potting technique around the neck of the humeral implant.  This was an  HA-coated press-fit implant used in this hybrid manner.  With that small amount of cement, we impacted the stem into place.  Again, the 10 stem EPI-1 left set on 0 setting and placed in 10 degrees of retroversion and impacted into position.  It was very  secure.  We went ahead and took a 38+3 poly real and impacted that into position, reduced the shoulder and pleased with soft tissue balancing, nice and stable with no gapping with inferior pole or external rotation and no impingement.  We irrigated again and then closed the wound.  We resected the remnant of the subscap and closed deltopectoral incision with 0 Vicryl suture followed by 2-0 Vicryl for subcutaneous closure and 4-0 Monocryl for skin.  Steri-Strips were applied followed by  sterile dressing.  The patient tolerated surgery well.  TN/NUANCE  D:05/09/2018 T:05/09/2018 JOB:[id number]

## 2018-05-10 LAB — BASIC METABOLIC PANEL
ANION GAP: 10 (ref 5–15)
BUN: 19 mg/dL (ref 8–23)
CALCIUM: 8.9 mg/dL (ref 8.9–10.3)
CO2: 27 mmol/L (ref 22–32)
Chloride: 102 mmol/L (ref 98–111)
Creatinine, Ser: 0.93 mg/dL (ref 0.44–1.00)
GFR calc non Af Amer: 58 mL/min — ABNORMAL LOW (ref >60)
GLUCOSE: 152 mg/dL — AB (ref 70–99)
Potassium: 4 mmol/L (ref 3.5–5.1)
Sodium: 139 mmol/L (ref 135–145)

## 2018-05-10 LAB — HEMOGLOBIN AND HEMATOCRIT, BLOOD
HCT: 31 % — ABNORMAL LOW (ref 36.0–46.0)
HEMOGLOBIN: 10 g/dL — AB (ref 12.0–15.0)

## 2018-05-10 LAB — GLUCOSE, CAPILLARY: Glucose-Capillary: 137 mg/dL — ABNORMAL HIGH (ref 70–99)

## 2018-05-10 MED ORDER — HYDROXYZINE HCL 50 MG/ML IM SOLN
50.0000 mg | Freq: Four times a day (QID) | INTRAMUSCULAR | Status: DC | PRN
  Administered 2018-05-10: 50 mg via INTRAMUSCULAR
  Filled 2018-05-10: qty 1

## 2018-05-10 NOTE — Progress Notes (Signed)
   Subjective:  Patient reports pain as mild.  No pain.  Doing very well. No complaints.  Objective:   VITALS:   Vitals:   05/09/18 1954 05/09/18 2310 05/10/18 0416 05/10/18 0711  BP: (!) 112/59 (!) 94/52 (!) 110/53 110/64  Pulse: 88 90 73 71  Resp: 18 18 18 17   Temp: 98.6 F (37 C) 97.9 F (36.6 C) 97.7 F (36.5 C) 98.2 F (36.8 C)  TempSrc: Oral Oral Oral Oral  SpO2: 99% 97% 97% 99%  Weight:      Height:        Neurologically intact Neurovascular intact Sensation intact distally Intact pulses distally Incision: dressing C/D/I Compartment soft   Lab Results  Component Value Date   WBC 3.0 (L) 05/01/2018   HGB 10.0 (L) 05/10/2018   HCT 31.0 (L) 05/10/2018   MCV 85.3 05/01/2018   PLT 232 05/01/2018   BMET    Component Value Date/Time   NA 139 05/10/2018 0621   K 4.0 05/10/2018 0621   CL 102 05/10/2018 0621   CO2 27 05/10/2018 0621   GLUCOSE 152 (H) 05/10/2018 0621   BUN 19 05/10/2018 0621   CREATININE 0.93 05/10/2018 0621   CALCIUM 8.9 05/10/2018 0621   GFRNONAA 58 (L) 05/10/2018 0621   GFRAA >60 05/10/2018 6815     Assessment/Plan: 1 Day Post-Op   Active Problems:   H/O total shoulder replacement, left   Up with therapy Dc home today Dressing changed. Fu with Dr. Veverly Fells in 2 weeks.   Nicholes Stairs 05/10/2018, 10:21 AM   Geralynn Rile, MD [phone]

## 2018-05-10 NOTE — Progress Notes (Signed)
Orthopedic Tech Progress Note Patient Details:  Savannah Lewis 12-Jun-1941 [id number]  Ortho Devices Type of Ortho Device: Arm sling Ortho Device/Splint Location: replacement arm slint   Post Interventions Instructions Provided: Care of device   [place] Pink 05/10/2018, 10:51 AM

## 2018-05-10 NOTE — Evaluation (Signed)
Occupational Therapy Evaluation Patient Details Name: Savannah Lewis MRN: [id number] DOB: [birth year] Today's Date: 05/10/2018    History of Present Illness Pt is a 77 y/o female s/p L reverse total shoulder due to end stage arthritic changes and pain failing conservative measures.  PMH signfiicant for but not limited to: cervical spondylosis, HTN, type 2 DM, B TKA.    Clinical Impression   PTA patient independent.  Admitted for above and limited by decreased functional use and precautions to L UE, pain, nausea, impaired balance and decreased activity tolerance.  Educated on precautions, safety, sling mgmt and wear schedule, positioning, ADL compensatory techniques, energy conservation and recommendations.  Requires AA/PROM to complete exercises to elbow/wrist/hand due to nerve block, but educated on exercises to tolerance. Patient able to complete UB dressing with modA, UB bathing with minA, LB ADLs with min assist, toilet transfer with supervision.  Limited during session by nausea during ADL training.  Will continue to follow while admitted in order to provide education to family before discharge.  Will have 24/7 support from daughter and at this time no further OT recommended after dc.      Follow Up Recommendations  Follow surgeon's recommendation for DC plan and follow-up therapies;Supervision/Assistance - 24 hour    Equipment Recommendations  3 in 1 bedside commode    Recommendations for Other Services       Precautions / Restrictions Precautions Precautions: Shoulder Type of Shoulder Precautions: Passive protocol: no ROM at shoulder (only for ADLs A/PROM FF90, ABD60, WU98), ok elbow/wrist/hand Shoulder Interventions: Shoulder sling/immobilizer;Off for dressing/bathing/exercises;For comfort(for comfort and sleeping) Precaution Booklet Issued: Yes (comment) Precaution Comments: reviewed with patient  Required Braces or Orthoses: Sling Restrictions Weight Bearing Restrictions:  Yes LUE Weight Bearing: Non weight bearing      Mobility Bed Mobility Overal bed mobility: Needs Assistance Bed Mobility: Supine to Sit;Sit to Supine     Supine to sit: Supervision Sit to supine: Supervision   General bed mobility comments: supervision for safety  Transfers Overall transfer level: Needs assistance Equipment used: None Transfers: Sit to/from Stand Sit to Stand: Supervision         General transfer comment: supervision for safety    Balance Overall balance assessment: Mild deficits observed, not formally tested                                         ADL either performed or assessed with clinical judgement   ADL Overall ADL's : Needs assistance/impaired Eating/Feeding: Modified independent;Sitting Eating/Feeding Details (indicate cue type and reason): using L UE for holding items while opening  Grooming: Set up;Sitting   Upper Body Bathing: Minimal assistance;Sitting Upper Body Bathing Details (indicate cue type and reason): reviewed techniques to bathe under L UE, position for UE with sling off Lower Body Bathing: Set up;Sit to/from stand Lower Body Bathing Details (indicate cue type and reason): reviewed safety bathing seated  Upper Body Dressing : Moderate assistance;Sitting Upper Body Dressing Details (indicate cue type and reason): assist to thread B UEs today Lower Body Dressing: Minimal assistance;Sit to/from stand Lower Body Dressing Details (indicate cue type and reason): assist to thread L LE into pants  Toilet Transfer: Supervision/safety;Ambulation(simulated in room)   Toileting- Clothing Manipulation and Hygiene: Supervision/safety;Sit to/from stand       Functional mobility during ADLs: Supervision/safety General ADL Comments: Pt limited by decreased activity tolerance and nausea.  Reviewed precautions  and ADL compesnatory techniques.      Vision Baseline Vision/History: Wears glasses Wears Glasses: At all  times Patient Visual Report: No change from baseline Vision Assessment?: No apparent visual deficits     Perception     Praxis      Pertinent Vitals/Pain Pain Assessment: Faces Faces Pain Scale: Hurts little more Pain Location: shoulder L Pain Descriptors / Indicators: Discomfort;Operative site guarding Pain Intervention(s): Limited activity within patient's tolerance;Repositioned     Hand Dominance Right   Extremity/Trunk Assessment Upper Extremity Assessment Upper Extremity Assessment: LUE deficits/detail LUE Deficits / Details: s/p total shoulder replacement, with nerve block intact  LUE Sensation: (light touch intact, nerve block intact) LUE Coordination: decreased fine motor;decreased gross motor   Lower Extremity Assessment Lower Extremity Assessment: Overall WFL for tasks assessed   Cervical / Trunk Assessment Cervical / Trunk Assessment: Normal   Communication Communication Communication: No difficulties   Cognition Arousal/Alertness: Awake/alert Behavior During Therapy: WFL for tasks assessed/performed Overall Cognitive Status: Within Functional Limits for tasks assessed                                     General Comments  patient becoming nauseated towards end of session    Exercises Exercises: Shoulder;Other exercises Shoulder Exercises Elbow Flexion: AAROM;Left;5 reps;Seated Elbow Extension: AAROM;Left;5 reps;Seated Wrist Flexion: PROM;5 reps;Left;Seated Wrist Extension: PROM;Left;5 reps;Seated Digit Composite Flexion: Left;5 reps;Seated;AROM Composite Extension: AAROM;Left;5 reps;Seated Other Exercises Other Exercises: PROM L 5 reps, supination/pronation seated   Shoulder Instructions Shoulder Instructions Donning/doffing shirt without moving shoulder: Moderate assistance Method for sponge bathing under operated UE: Minimal assistance Donning/doffing sling/immobilizer: Maximal assistance Correct positioning of sling/immobilizer:  Minimal assistance ROM for elbow, wrist and digits of operated UE: Moderate assistance Sling wearing schedule (on at all times/off for ADL's): Supervision/safety Proper positioning of operated UE when showering: Supervision/safety Positioning of UE while sleeping: [place] expects to be discharged to:: Private residence Living Arrangements: Alone Available Help at Discharge: Family;Available 24 hours/day(daughter assisting 24/7 initally) Type of Home: Other(Comment)(town house) Home Access: Level entry     Home Layout: Two level;Bed/bath upstairs Alternate Level Stairs-Number of Steps: flight Alternate Level Stairs-Rails: Can reach both;Right;Left Bathroom Shower/Tub: Teacher, early years/pre: Standard     Home Equipment: None          Prior Functioning/Environment Level of Independence: Independent                 OT Problem List: Decreased activity tolerance;Impaired balance (sitting and/or standing);Decreased coordination;Decreased safety awareness;Decreased knowledge of precautions;Decreased knowledge of use of DME or AE;Pain;Impaired UE functional use;Impaired sensation;Decreased range of motion;Decreased strength      OT Treatment/Interventions: Self-care/ADL training;Therapeutic exercise;Energy conservation;DME and/or AE instruction;Therapeutic activities;Patient/family education;Balance training    OT Goals(Current goals can be found in the care plan section) Acute Rehab OT Goals Patient Stated Goal: to get home and feel better OT Goal Formulation: With patient Time For Goal Achievement: 05/24/18 Potential to Achieve Goals: Good  OT Frequency: Min 2X/week   Barriers to D/C:            Co-evaluation              AM-PAC PT "6 Clicks" Daily Activity     Outcome Measure Help from another person eating meals?: None Help from another person taking care of personal grooming?: None Help from another  person toileting, which includes using  toliet, bedpan, or urinal?: None Help from another person bathing (including washing, rinsing, drying)?: A Little Help from another person to put on and taking off regular upper body clothing?: A Lot Help from another person to put on and taking off regular lower body clothing?: A Little 6 Click Score: 20   End of Session Equipment Utilized During Treatment: Other (comment)(sling) Nurse Communication: Mobility status;Other (comment)(request for nausea medication)  Activity Tolerance: Patient tolerated treatment well;Other (comment)(limited by nausea) Patient left: in bed;with call bell/phone within reach  OT Visit Diagnosis: Pain Pain - Right/Left: Left Pain - part of body: Shoulder                Time: 5625-6389 OT Time Calculation (min): 27 min Charges:  OT General Charges $OT Visit: 1 Visit OT Evaluation $OT Eval Moderate Complexity: 1 Mod OT Treatments $Self Care/Home Management : 8-22 mins  Delight Stare, OT Acute Rehabilitation Services Pager [phone] Office [phone]   Delight Stare 05/10/2018, 8:51 AM

## 2018-05-10 NOTE — Progress Notes (Signed)
Occupational Therapy Treatment Patient Details Name: Savannah Lewis MRN: [id number] DOB: 1940-11-09 Today's Date: 05/10/2018    History of present illness Pt is a 77 y/o female s/p L reverse total shoulder due to end stage arthritic changes and pain failing conservative measures.  PMH signfiicant for but not limited to: cervical spondylosis, HTN, type 2 DM, B TKA.    OT comments  Session to review precautions and ADL compensatory techniques with patient and daughter.  Patient able to complete exercises and verbalize precautions, daughter demonstrates ability to don/doff sling after education with independence.  Daughter verbalizes understanding of assistance needs for ADLs (bathing under L UE and R UE, min assist for UB dressing and sling mgmt).  Patient and daughter have no further questions or concerns at this time.  Will continue to follow while admitted.    Follow Up Recommendations  Follow surgeon's recommendation for DC plan and follow-up therapies;Supervision/Assistance - 24 hour    Equipment Recommendations  3 in 1 bedside commode    Recommendations for Other Services      Precautions / Restrictions Precautions Precautions: Shoulder Type of Shoulder Precautions: Passive protocol: no ROM at shoulder (only for ADLs A/PROM FF90, ABD60, QP61), ok elbow/wrist/hand Shoulder Interventions: Shoulder sling/immobilizer;Off for dressing/bathing/exercises;For comfort(for comfort and sleeping) Precaution Booklet Issued: Yes (comment) Precaution Comments: reviewed with patient and daughter Required Braces or Orthoses: Sling Restrictions Weight Bearing Restrictions: Yes LUE Weight Bearing: Non weight bearing       Mobility Bed Mobility Overal bed mobility: Needs Assistance Bed Mobility: Supine to Sit     Supine to sit: Supervision Sit to supine: Supervision   General bed mobility comments: supervision for safety  Transfers Overall transfer level: Needs assistance Equipment  used: None Transfers: Sit to/from Stand Sit to Stand: Supervision         General transfer comment: supervision for safety    Balance Overall balance assessment: Mild deficits observed, not formally tested                                         ADL either performed or assessed with clinical judgement   ADL Overall ADL's : Needs assistance/impaired Eating/Feeding: Modified independent;Sitting Eating/Feeding Details (indicate cue type and reason): using L UE for holding items while opening  Grooming: Set up;Sitting   Upper Body Bathing: With caregiver independent assisting;Minimal assistance;Sitting Upper Body Bathing Details (indicate cue type and reason): reviewed techniques to bathe under L UE, position for UE with sling off Lower Body Bathing: Set up;Sit to/from stand Lower Body Bathing Details (indicate cue type and reason): reviewed safety bathing seated  Upper Body Dressing : Minimal assistance;Sitting;With caregiver independent assisting Upper Body Dressing Details (indicate cue type and reason): reviewed technique and precautions with patient  Lower Body Dressing: Minimal assistance;Sit to/from stand Lower Body Dressing Details (indicate cue type and reason): assist to thread L LE into pants  Toilet Transfer: Supervision/safety;Ambulation(simulated in room)   Toileting- Clothing Manipulation and Hygiene: Supervision/safety;Sit to/from stand       Functional mobility during ADLs: Supervision/safety General ADL Comments: reviewed ADL compensatory techniques and safety with patinet and daughter     Vision Baseline Vision/History: Wears glasses Wears Glasses: At all times Patient Visual Report: No change from baseline Vision Assessment?: No apparent visual deficits   Perception     Praxis      Cognition Arousal/Alertness: Awake/alert Behavior During Therapy: [facility] for  tasks assessed/performed Overall Cognitive Status: Within Functional Limits for  tasks assessed                                          Exercises Exercises: Shoulder;Other exercises Shoulder Exercises Elbow Flexion: Left;5 reps;Seated;AROM Elbow Extension: Left;5 reps;Seated;AROM Wrist Flexion: 5 reps;Left;Seated;AROM Wrist Extension: Left;5 reps;Seated;AAROM Digit Composite Flexion: Left;5 reps;Seated;AROM Composite Extension: Left;5 reps;[facility] Other Exercises Other Exercises: AROM L 5 reps, supination/pronation seated   Shoulder Instructions Shoulder Instructions Donning/doffing shirt without moving shoulder: Minimal assistance;Caregiver independent with task Method for sponge bathing under operated UE: Minimal assistance;Caregiver independent with task Donning/doffing sling/immobilizer: Caregiver independent with task;Moderate assistance;Patient able to independently direct caregiver Correct positioning of sling/immobilizer: Modified independent;Caregiver independent with task;Patient able to independently direct caregiver ROM for elbow, wrist and digits of operated UE: Minimal assistance Sling wearing schedule (on at all times/off for ADL's): Supervision/safety Proper positioning of operated UE when showering: Supervision/safety Positioning of UE while sleeping: Supervision/safety     General Comments patient becoming nauseated towards end of session    Pertinent Vitals/ Pain       Pain Assessment: Faces Faces Pain Scale: Hurts a little bit Pain Location: shoulder L Pain Descriptors / Indicators: Discomfort;Operative site guarding Pain Intervention(s): Repositioned  Home Living Family/patient expects to be discharged to:: Private residence Living Arrangements: Alone Available Help at Discharge: Family;Available 24 hours/day(daughter assisting 24/7 initally) Type of Home: Other(Comment)(town house) Home Access: Level entry     Home Layout: Two level;Bed/bath upstairs Alternate Level Stairs-Number of Steps: flight Alternate  Level Stairs-Rails: Can reach both;Right;Left Bathroom Shower/Tub: Teacher, early years/pre: Standard     Home Equipment: None          Prior Functioning/Environment Level of Independence: Independent            Frequency  Min 2X/week        Progress Toward Goals  OT Goals(current goals can now be found in the care plan section)  Progress towards OT goals: Progressing toward goals  Acute Rehab OT Goals Patient Stated Goal: to get home and feel better OT Goal Formulation: With patient Time For Goal Achievement: 05/24/18 Potential to Achieve Goals: Good ADL Goals Pt Will Perform Upper Body Bathing: with caregiver independent in assisting;with min assist Pt Will Perform Upper Body Dressing: with caregiver independent in assisting;with min assist Additional ADL Goal #1: Caregiver will demonstrate donning/doffing sling with independence, patient will verbalize L shoulder precautions with independence.  Plan Discharge plan remains appropriate    Co-evaluation                 AM-PAC PT "6 Clicks" Daily Activity     Outcome Measure   Help from another person eating meals?: None Help from another person taking care of personal grooming?: None Help from another person toileting, which includes using toliet, bedpan, or urinal?: None Help from another person bathing (including washing, rinsing, drying)?: A Little Help from another person to put on and taking off regular upper body clothing?: A Lot Help from another person to put on and taking off regular lower body clothing?: A Little 6 Click Score: 20    End of Session Equipment Utilized During Treatment: Other (comment)(sling)  OT Visit Diagnosis: Pain Pain - Right/Left: Left Pain - part of body: Shoulder   Activity Tolerance Patient tolerated treatment well;Other (comment)   Patient Left with call bell/phone  within reach;with family/visitor present(seated EOB)   Nurse Communication Mobility  status        Time: 9038-3338 OT Time Calculation (min): 12 min  Charges: OT General Charges $OT Visit: 1 Visit OT Evaluation $OT Eval Moderate Complexity: 1 Mod OT Treatments $Self Care/Home Management : 8-22 mins  Delight Stare, OT Acute Rehabilitation Services Pager [phone] Office [phone]    Delight Stare 05/10/2018, 9:53 AM

## 2018-05-10 NOTE — Progress Notes (Signed)
Patient alert and oriented, mae's well, voiding adequate amount of urine, swallowing without difficulty, no c/o pain at time of discharge. Patient discharged home with family. Script and discharged instructions given to patient. Patient and family stated understanding of instructions given. Patient has an appointment with Dr. Norris 

## 2018-05-12 ENCOUNTER — Encounter (HOSPITAL_COMMUNITY): Payer: Self-pay | Admitting: Orthopedic Surgery

## 2018-05-12 ENCOUNTER — Other Ambulatory Visit: Payer: Self-pay | Admitting: *Deleted

## 2018-05-12 NOTE — Patient Outreach (Signed)
[place] [facility]) Care Management  05/12/2018  Maciel B [place] Mar 23, 1941 [id number]   Subjective: Telephone call to patient's home number, no answer, left HIPAA compliant voicemail message, and requested call back.     Objective: Per KPN (Knowledge Performance Now, point of care tool) and chart review, patient hospitalized 05/09/18 -05/10/18 for left shoulder pain, status post REVERSE LEFT SHOULDER ARTHROPLASTY (Left) DePuy Delta Xtend on 05/09/18.    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      Assessment:  Received [facility]MR Transition of care referral on 05/12/18.   Transition of care follow up pending patient contact.       Plan: RNCM will send unsuccessful outreach  letter, [facility] pamphlet, will call patient for 2nd telephone outreach attempt, transition of care follow up, and proceed with case closure, within 10 business days if no return call.        Kaleesi Guyton H. Annia Friendly, BSN, [place] Management [facility] Telephonic CM Phone: [phone] Fax: [phone]

## 2018-05-12 NOTE — Anesthesia Postprocedure Evaluation (Signed)
Anesthesia Post Note  Patient: Savannah Lewis  Procedure(s) Performed: REVERSE LEFT SHOULDER ARTHROPLASTY (Left Shoulder)     Patient location during evaluation: PACU Anesthesia Type: Regional Level of consciousness: awake and alert Pain management: pain level controlled Vital Signs Assessment: post-procedure vital signs reviewed and stable Respiratory status: spontaneous breathing, nonlabored ventilation, respiratory function stable and patient connected to nasal cannula oxygen Cardiovascular status: blood pressure returned to baseline and stable Postop Assessment: no apparent nausea or vomiting Anesthetic complications: no    Last Vitals:  Vitals:   05/10/18 0416 05/10/18 0711  BP: (!) 110/53 110/64  Pulse: 73 71  Resp: 18 17  Temp: 36.5 C 36.8 C  SpO2: 97% 99%    Last Pain:  Vitals:   05/10/18 0711  TempSrc: Oral  PainSc:    Pain Goal: Patients Stated Pain Goal: 2 (05/10/18 0623)               Lidia Collum

## 2018-05-13 ENCOUNTER — Other Ambulatory Visit: Payer: Self-pay | Admitting: *Deleted

## 2018-05-13 ENCOUNTER — Encounter: Payer: Self-pay | Admitting: *Deleted

## 2018-05-13 NOTE — Patient Outreach (Addendum)
[place] [facility]) Care Management  05/13/2018  Judyth B Lewis 1941/06/14 [id number]    Subjective: Received voicemail message from Savannah Lewis, states she is returning call, and requested call back. Telephone call to patient's home number, spoke with patient, and HIPAA verified.  Discussed [facility] Care Management UMR Transition of care follow up, patient voiced understanding, and is in agreement to follow up.  Patient states she is feeling good, home exercise program going well, and has a follow up appointment with surgeon on 05/22/18.   Patient states she did not receive a copy of discharge instructions and is planning to follow up with hospital to obtain copy.   RNCM verbally reviewed discharge instructions, patient voiced understanding, is aware of signs / symptoms to report to MD, and will follow up as needed.  Patient states she is able to manage self care and has assistance as needed.  Patient voices understanding of medical diagnosis, surgery,  and treatment plan. States she is accessing the following [facility] benefits: outpatient pharmacy (uses local pharmacy, aware of benefit of [facility] outpatient pharmacy versus local pharmacy , is aware of how to access [facility] outpatient pharmacy if needed) hospital indemnity (verbally given contact number for [employer] [phone] file claim if appropriate, verbally given contact number for [employer] [phone] to request itemized bill), and has Americans with Disabilities Act (ADA) accommodation in place.  Discussed diabetes disease management resources for [facility] employees and advised RNCM will send web sites in the successful outreach letter.    Patient also in agreement to [place] Management referral.   Patient states she does not have any education material, transition of care, care coordination, transportation, community resource, or pharmacy needs at this time.  States she is very appreciative of the follow up and is in  agreement to receive [place] Management information.     Objective: Per KPN (Knowledge Performance Now, point of care tool) and chart review, patient hospitalized 05/09/18 -05/10/18 for left shoulder pain, status post REVERSE LEFT SHOULDER ARTHROPLASTY (Left)DePuy Delta Xtend on 05/09/18.    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      Assessment:  Received [facility]MR Transition of care referral on 05/12/18.  Transition of care follow up completed, no care management needs, and will proceed with case closure.      Plan: RNCM will send patient successful outreach letter, [facility] pamphlet, and magnet. RNCM will complete case closure due to follow up completed / no care management needs.  RNCM will send [employer] Referral request to [facility] [place] [facility].        Michell Kader H. Annia Friendly, BSN, [place] Management [facility] Telephonic CM Phone: [phone] Fax: [phone]

## 2018-05-13 NOTE — Discharge Summary (Signed)
Orthopedic Discharge Summary        Physician Discharge Summary  Patient ID: Savannah Lewis MRN: [id number] DOB/AGE: February 24, 1941 77 y.o.  Admit date: 05/09/2018 Discharge date: 05/10/18   Procedures:  Procedure(s) (LRB): REVERSE LEFT SHOULDER ARTHROPLASTY (Left)  Attending Physician:  Dr. Esmond Plants  Admission Diagnoses:   Left shoulder rotator cuff arthropathy  Discharge Diagnoses:  Left shoulder rotator cuff arthropathy   Past Medical History:  Diagnosis Date  . Cervical spondylosis   . DDD (degenerative disc disease), cervical   . De Quervain's tenosynovitis, left    WRIST  . Diabetic retinopathy, nonproliferative ([place])   . Diverticulosis   . DJD (degenerative joint disease)    left knee  . Heart murmur    told a last physical a slight heart murmurper patient  . 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 History of Bell's palsy    2003  RIGHT SIDE-  resolved  . Hypertension   . Thoracic aortic aneurysm [facility])    followed by Dr Tharon Aquas Trigt- LOV- 02/2013 in [facility]   . Thoracic ascending aortic aneurysm ([place])    4.1 CM  STABLE SINCE 2004 PER DR VANTRIGHT NOTE 02/2013  . TMJ (temporomandibular joint syndrome)   . Type 2 diabetes mellitus ([place])   . Wears glasses     PCP: Lavone Orn, MD   Discharged Condition: good  Hospital Course:  Patient underwent the above stated procedure on 05/09/2018. Patient tolerated the procedure well and brought to the recovery room in good condition and subsequently to the floor. Patient had an uncomplicated hospital course and was stable for discharge.   Disposition:  with follow up in 2 weeks   Follow-up Information    Netta Cedars, MD. Call in 2 weeks.   Specialty:  Orthopedic Surgery Why:  [phone] Contact information: [address] [place] [address] [phone]           Discharge Instructions    Call MD / Call 911    Complete by:  As directed    If you experience chest pain or shortness of breath, CALL 911 and be transported to the hospital emergency room.  If you develope a fever above 101 F, pus (white drainage) or increased drainage or redness at the wound, or calf pain, call your surgeon's office.   Constipation Prevention   Complete by:  As directed    Drink plenty of fluids.  Prune juice may be helpful.  You may use a stool softener, such as Colace (over the counter) 100 mg twice a day.  Use MiraLax (over the counter) for constipation as needed.   Diet - low sodium heart healthy   Complete by:  As directed    Increase activity slowly as tolerated   Complete by:  As directed       Allergies as of 05/10/2018   No Known Allergies     Medication List    STOP taking these medications   fluticasone 50 MCG/ACT nasal spray Commonly known as:  FLONASE     TAKE these medications   amLODipine 10 MG tablet Commonly known as:  NORVASC Take 10 mg by mouth every morning.   calcium carbonate 1500 (600 Ca) MG Tabs tablet Commonly known as:  OSCAL Take 600 mg of elemental calcium by mouth daily with breakfast.   hydroxypropyl methylcellulose / hypromellose 2.5 % ophthalmic solution Commonly known as:  ISOPTO TEARS / GONIOVISC Place 1 drop into both eyes daily.   metFORMIN 1000 MG tablet Commonly known as:  GLUCOPHAGE Take 1,000 mg by mouth 2 (two) times daily with a meal.   multivitamin with minerals tablet Take 1 tablet by mouth daily.   telmisartan-hydrochlorothiazide 80-25 MG tablet Commonly known as:  MICARDIS HCT Take 1 tablet by mouth daily.   traMADol 50 MG tablet Commonly known as:  ULTRAM Take 1-2 tablets (50-100 mg total) by mouth every 6 (six) hours as needed for moderate pain or severe pain. What changed:    how much to take  reasons to take this         Signed: Ventura Bruns 05/13/2018, 7:52 AM  [facility] is now [facility] [address] [place], [place], [place] [address] Phone: [place]

## 2018-05-14 ENCOUNTER — Ambulatory Visit: Payer: Self-pay | Admitting: *Deleted

## 2018-05-14 NOTE — Addendum Note (Signed)
Addendum  created 05/14/18 6725 by Lidia Collum, MD   Intraprocedure Blocks edited, Sign clinical note

## 2018-05-22 DIAGNOSIS — Z4889 Encounter for other specified surgical aftercare: Secondary | ICD-10-CM | POA: Diagnosis not present

## 2018-05-22 DIAGNOSIS — M25812 Other specified joint disorders, left shoulder: Secondary | ICD-10-CM | POA: Diagnosis not present

## 2018-06-04 DIAGNOSIS — M25562 Pain in left knee: Secondary | ICD-10-CM | POA: Diagnosis not present

## 2018-06-19 DIAGNOSIS — M25512 Pain in left shoulder: Secondary | ICD-10-CM | POA: Diagnosis not present

## 2018-06-19 DIAGNOSIS — M19012 Primary osteoarthritis, left shoulder: Secondary | ICD-10-CM | POA: Diagnosis not present

## 2018-06-19 DIAGNOSIS — Z471 Aftercare following joint replacement surgery: Secondary | ICD-10-CM | POA: Diagnosis not present

## 2018-06-23 DIAGNOSIS — Z86010 Personal history of colonic polyps: Secondary | ICD-10-CM | POA: Diagnosis not present

## 2018-06-23 DIAGNOSIS — K5900 Constipation, unspecified: Secondary | ICD-10-CM | POA: Diagnosis not present

## 2018-06-23 DIAGNOSIS — R1030 Lower abdominal pain, unspecified: Secondary | ICD-10-CM | POA: Diagnosis not present

## 2018-06-27 DIAGNOSIS — Z96612 Presence of left artificial shoulder joint: Secondary | ICD-10-CM | POA: Diagnosis not present

## 2018-06-27 DIAGNOSIS — R112 Nausea with vomiting, unspecified: Secondary | ICD-10-CM | POA: Diagnosis not present

## 2018-06-27 DIAGNOSIS — D509 Iron deficiency anemia, unspecified: Secondary | ICD-10-CM | POA: Diagnosis not present

## 2018-06-27 DIAGNOSIS — R1084 Generalized abdominal pain: Secondary | ICD-10-CM | POA: Diagnosis not present

## 2018-06-27 DIAGNOSIS — K5903 Drug induced constipation: Secondary | ICD-10-CM | POA: Diagnosis not present

## 2018-06-27 DIAGNOSIS — Z86010 Personal history of colonic polyps: Secondary | ICD-10-CM | POA: Diagnosis not present

## 2018-07-30 DIAGNOSIS — R1084 Generalized abdominal pain: Secondary | ICD-10-CM | POA: Diagnosis not present

## 2018-07-30 DIAGNOSIS — K210 Gastro-esophageal reflux disease with esophagitis: Secondary | ICD-10-CM | POA: Diagnosis not present

## 2018-07-30 DIAGNOSIS — R110 Nausea: Secondary | ICD-10-CM | POA: Diagnosis not present

## 2018-07-30 DIAGNOSIS — K2980 Duodenitis without bleeding: Secondary | ICD-10-CM | POA: Diagnosis not present

## 2018-07-30 DIAGNOSIS — K2930 Chronic superficial gastritis without bleeding: Secondary | ICD-10-CM | POA: Diagnosis not present

## 2018-07-30 DIAGNOSIS — D509 Iron deficiency anemia, unspecified: Secondary | ICD-10-CM | POA: Diagnosis not present

## 2018-07-30 DIAGNOSIS — Z86010 Personal history of colonic polyps: Secondary | ICD-10-CM | POA: Diagnosis not present

## 2018-07-30 DIAGNOSIS — K5730 Diverticulosis of large intestine without perforation or abscess without bleeding: Secondary | ICD-10-CM | POA: Diagnosis not present

## 2018-07-30 DIAGNOSIS — D122 Benign neoplasm of ascending colon: Secondary | ICD-10-CM | POA: Diagnosis not present

## 2018-09-05 ENCOUNTER — Other Ambulatory Visit: Payer: Self-pay | Admitting: Internal Medicine

## 2018-09-05 DIAGNOSIS — R911 Solitary pulmonary nodule: Secondary | ICD-10-CM

## 2018-09-05 DIAGNOSIS — E1169 Type 2 diabetes mellitus with other specified complication: Secondary | ICD-10-CM | POA: Diagnosis not present

## 2018-09-05 DIAGNOSIS — I10 Essential (primary) hypertension: Secondary | ICD-10-CM | POA: Diagnosis not present

## 2018-09-10 ENCOUNTER — Ambulatory Visit
Admission: RE | Admit: 2018-09-10 | Discharge: 2018-09-10 | Disposition: A | Discharge status: Home / Self Care | Payer: 59 | Source: Ambulatory Visit | Attending: Internal Medicine | Admitting: Internal Medicine

## 2018-09-10 DIAGNOSIS — J439 Emphysema, unspecified: Secondary | ICD-10-CM | POA: Diagnosis not present

## 2018-09-10 DIAGNOSIS — R911 Solitary pulmonary nodule: Secondary | ICD-10-CM

## 2018-09-16 DIAGNOSIS — Z471 Aftercare following joint replacement surgery: Secondary | ICD-10-CM | POA: Diagnosis not present

## 2018-09-16 DIAGNOSIS — Z96612 Presence of left artificial shoulder joint: Secondary | ICD-10-CM | POA: Diagnosis not present

## 2018-10-07 ENCOUNTER — Ambulatory Visit (HOSPITAL_COMMUNITY)
Admission: EM | Admit: 2018-10-07 | Discharge: 2018-10-07 | Disposition: A | Discharge status: Home / Self Care | Payer: 59 | Attending: Family Medicine | Admitting: Family Medicine

## 2018-10-07 ENCOUNTER — Encounter (HOSPITAL_COMMUNITY): Payer: Self-pay | Admitting: Emergency Medicine

## 2018-10-07 DIAGNOSIS — S99921A Unspecified injury of right foot, initial encounter: Secondary | ICD-10-CM | POA: Diagnosis not present

## 2018-10-07 NOTE — Discharge Instructions (Addendum)
We have buddy taped your toe here today for stability and improved healing. You may use ice for swelling, 20 minutes at a time, every few hours.  You may do activity as tolerated. Continue to take aleve or ibuprofen for pain relief. Please see your PCP if your pain fails to improve over the next few weeks.

## 2018-10-07 NOTE — ED Triage Notes (Signed)
Pt states a week ago she stubbed her R pinkie toe on the bed frame, c/o ongoing soreness. Ambulatory.

## 2018-10-07 NOTE — ED Provider Notes (Signed)
[place]    CSN: [id number] Arrival date & time: 10/07/18  1209     History   Chief Complaint Chief Complaint  Patient presents with  . Toe Pain    HPI Savannah Lewis is a 78 y.o. female.   HPI  Patient presents for pain in her Right 5th toe after hitting it on her bed last Saturday (2/8). She reports she is still able to bear weight and ambulate on foot but has pain with walking. She has been taking tylenol/ibuprofen/aleve at home as well as soaking foot in warm epsom salt soaks with some relief of pain. She denies pain or swelling in her foot and states that pain is localized to her toe only. She does go to the gym on a regular basis and wanted to have her toe looked at since she likes to walk on the treadmill and has not been able to tolerate doing so as much since her injury.   Past Medical History:  Diagnosis Date  . Cervical spondylosis   . DDD (degenerative disc disease), cervical   . De Quervain's tenosynovitis, left    WRIST  . Diabetic retinopathy, nonproliferative ([place])   . Diverticulosis   . DJD (degenerative joint disease)    left knee  . Heart murmur    told a last physical a slight heart murmurper patient  . 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 History of Bell's palsy    2003  RIGHT SIDE-  resolved  . Hypertension   . Thoracic aortic aneurysm [facility])    followed by Dr Tharon Aquas Trigt- LOV- 02/2013 in [facility]   . Thoracic ascending aortic aneurysm ([place])    4.1 CM  STABLE SINCE 2004 PER DR VANTRIGHT NOTE 02/2013  . TMJ (temporomandibular joint syndrome)   . Type 2 diabetes mellitus ([place])   . Wears glasses     Patient Active Problem List   Diagnosis Date Noted  . H/O total shoulder replacement, 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  . 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 S/P knee replacement 08/31/2014  . Thoracic ascending aortic aneurysm ([place])   . Hypertension   . Arthritis   . Diabetes  ([place])   . Diabetes mellitus ([place]) 09/06/2012  . HTN (hypertension) 09/06/2012    Past Surgical History:  Procedure Laterality Date  . ABDOMINAL HYSTERECTOMY  1995   W/ UNILATERAL SALPINGOOPHORECTOMY  . COLONOSCOPY N/A 04/14/2013   Procedure: COLONOSCOPY;  Surgeon: Garlan Fair, MD;  Location: [facility];  Service: Endoscopy;  Laterality: N/A;  . DORSAL COMPARTMENT RELEASE Left 09/24/2013   Procedure: LEFT WRIST FIRST DORSAL COMPARTMENT TENOSYNOVITIS;  Surgeon: Linna Hoff, MD;  Location: [place];  Service: Orthopedics;  Laterality: Left;  ANESTHESIA: LOCAL/IV SEDATION  . JOINT REPLACEMENT    . KNEE ARTHROSCOPY Bilateral RIGHT 2002 & 1994/   LEFT  1995  . LAPAROSCOPY ASSISTED  RIGHT COLECTOMY  03-22-2005   CECUM POLYP  . 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/2018   Procedure: REVERSE LEFT SHOULDER ARTHROPLASTY;  Surgeon: Netta Cedars, MD;  Location: [place];  Service: Orthopedics;  Laterality: Left;  . ROTATOR CUFF REPAIR Right 08-31-2008  . TOTAL KNEE ARTHROPLASTY Right 07-01-2009  . TOTAL KNEE ARTHROPLASTY Left 08/31/2014   Procedure: LEFT TOTAL KNEE ARTHROPLASTY;  Surgeon: Sydnee Cabal, MD;  Location: [facility];  Service: Orthopedics;  Laterality: Left;    OB History   No obstetric  history on file.      Home Medications    Prior to Admission medications   Medication Sig Start Date End Date Taking? Authorizing Provider  amLODipine (NORVASC) 10 MG tablet Take 10 mg by mouth every morning.     [provider]  calcium carbonate (OSCAL) 1500 (600 Ca) MG TABS tablet Take 600 mg of elemental calcium by mouth daily with breakfast.     [provider]  hydroxypropyl methylcellulose / hypromellose (ISOPTO TEARS / GONIOVISC) 2.5 % ophthalmic solution Place 1 drop into both eyes daily.    [provider]  metFORMIN (GLUCOPHAGE) 1000 MG tablet Take 1,000 mg by mouth 2 (two) times daily with a meal.    [provider]  Multiple  Vitamins-Minerals (MULTIVITAMIN WITH MINERALS) tablet Take 1 tablet by mouth daily.    [provider]  telmisartan-hydrochlorothiazide (MICARDIS HCT) 80-25 MG tablet Take 1 tablet by mouth daily.    [provider]  traMADol (ULTRAM) 50 MG tablet Take 1-2 tablets (50-100 mg total) by mouth every 6 (six) hours as needed for moderate pain or severe pain. 05/09/18 05/09/19  Netta Cedars, MD    Family History Family History  Problem Relation Age of Onset  . Cancer Mother   . Diabetes Father     Social History Social History   Tobacco Use  . Smoking status: Former Smoker    Years: 2.00    Types: Cigarettes    Last attempt to quit: 08/21/1959    Years since quitting: 59.1  . Smokeless tobacco: Never Used  Substance Use Topics  . Alcohol use: No  . Drug use: No     Allergies   Patient has no known allergies.   Review of Systems Review of Systems  Constitutional: Negative for chills and fever.  HENT: Negative for ear pain and sore throat.   Eyes: Negative for pain and visual disturbance.  Respiratory: Negative for cough and shortness of breath.   Cardiovascular: Negative for chest pain and palpitations.  Gastrointestinal: Negative for abdominal pain and vomiting.  Genitourinary: Negative for dysuria and hematuria.  Musculoskeletal: Negative for gait problem.       Pain to Right 5th digit  Skin: Negative for color change and rash.  Neurological: Negative for seizures and syncope.  All other systems reviewed and are negative.    Physical Exam Triage Vital Signs ED Triage Vitals [10/07/18 1332]  Enc Vitals Group     BP (!) 162/85     Pulse Rate 79     Resp 18     Temp 98.2 F (36.8 C)     Temp src      SpO2 100 %     Weight      Height      Head Circumference      Peak Flow      Pain Score 5     Pain Loc      Pain Edu?      Excl. in [place]?    No data found.  Updated Vital Signs BP (!) 162/85   Pulse 79   Temp 98.2 F (36.8 C)   Resp 18    SpO2 100%    Physical Exam Constitutional:      General: She is not in acute distress.    Appearance: She is well-developed.  HENT:     Head: Normocephalic and atraumatic.  Eyes:     Conjunctiva/sclera: Conjunctivae normal.     Pupils: Pupils are equal, round,  and reactive to light.  Neck:     Musculoskeletal: Normal range of motion.  Cardiovascular:     Rate and Rhythm: Normal rate.  Pulmonary:     Effort: Pulmonary effort is normal. No respiratory distress.  Abdominal:     General: There is no distension.     Palpations: Abdomen is soft.  Musculoskeletal:       Feet:  Skin:    General: Skin is warm and dry.  Neurological:     Mental Status: She is alert.      UC Treatments / Results  Labs (all labs ordered are listed, but only abnormal results are displayed) Labs Reviewed - No data to display  EKG None  Radiology No results found.  Procedures Procedures (including critical care time)  Medications Ordered in UC Medications - No data to display  Initial Impression / Assessment and Plan / UC Course  I have reviewed the triage vital signs and the nursing notes.  Pertinent labs & imaging results that were available during my care of the patient were reviewed by me and considered in my medical decision making (see chart for details).      Final Clinical Impressions(s) / UC Diagnoses   Final diagnoses:  Closed nondisplaced fracture of phalanx of lesser toe of right foot, unspecified phalanx, initial encounter     Discharge Instructions     We have buddy taped your toe here today for stability and improved healing. You may use ice for swelling, 20 minutes at a time, every few hours.  You may do activity as tolerated. Continue to take aleve or ibuprofen for pain relief. Please see your PCP if your pain fails to improve over the next few weeks.     ED Prescriptions    None     Controlled Substance Prescriptions [place] Controlled Substance Registry  consulted? No    Raylene Everts, MD 10/07/18 [phone]

## 2018-12-16 MED FILL — ACCU-CHEK AVIVA PLUS STRP: 90 days supply | Qty: 100 | Fill #0

## 2018-12-16 MED FILL — ACCU-CHEK SOFTCLIX LANCETS: 90 days supply | Qty: 100 | Fill #0

## 2018-12-16 MED FILL — TELMISARTAN-HCTZ 80-25 MG T: 80-25 | 90 days supply | Qty: 90 | Fill #0

## 2018-12-16 MED FILL — AMLODIPINE BESYLATE 10 MG T: 10 | 60 days supply | Qty: 60 | Fill #0

## 2018-12-16 MED FILL — metFORMIN HCL 1000 MG TABS: 1000 | 90 days supply | Qty: 180 | Fill #0

## 2019-03-09 MED FILL — TELMISARTAN-HCTZ 80-25 MG T: 80-25 | 60 days supply | Qty: 60 | Fill #1

## 2019-03-09 MED FILL — AMLODIPINE BESYLATE 10 MG T: 10 | 90 days supply | Qty: 90 | Fill #0

## 2019-03-27 DIAGNOSIS — Z7984 Long term (current) use of oral hypoglycemic drugs: Secondary | ICD-10-CM | POA: Diagnosis not present

## 2019-03-27 DIAGNOSIS — R0982 Postnasal drip: Secondary | ICD-10-CM | POA: Diagnosis not present

## 2019-03-27 DIAGNOSIS — I700 Atherosclerosis of aorta: Secondary | ICD-10-CM | POA: Diagnosis not present

## 2019-03-27 DIAGNOSIS — D509 Iron deficiency anemia, unspecified: Secondary | ICD-10-CM | POA: Diagnosis not present

## 2019-03-27 DIAGNOSIS — K219 Gastro-esophageal reflux disease without esophagitis: Secondary | ICD-10-CM | POA: Diagnosis not present

## 2019-03-27 DIAGNOSIS — E1169 Type 2 diabetes mellitus with other specified complication: Secondary | ICD-10-CM | POA: Diagnosis not present

## 2019-03-27 DIAGNOSIS — Z13220 Encounter for screening for lipoid disorders: Secondary | ICD-10-CM | POA: Diagnosis not present

## 2019-03-27 DIAGNOSIS — I10 Essential (primary) hypertension: Secondary | ICD-10-CM | POA: Diagnosis not present

## 2019-03-27 DIAGNOSIS — Z0000 Encounter for general adult medical examination without abnormal findings: Secondary | ICD-10-CM | POA: Diagnosis not present

## 2019-04-03 MED FILL — PRAVASTATIN SODIUM 20 MG TA: 20 | 90 days supply | Qty: 90 | Fill #0

## 2019-04-09 DIAGNOSIS — R1310 Dysphagia, unspecified: Secondary | ICD-10-CM | POA: Diagnosis not present

## 2019-04-27 DIAGNOSIS — E119 Type 2 diabetes mellitus without complications: Secondary | ICD-10-CM | POA: Diagnosis not present

## 2019-04-27 DIAGNOSIS — H524 Presbyopia: Secondary | ICD-10-CM | POA: Diagnosis not present

## 2019-04-28 DIAGNOSIS — R1314 Dysphagia, pharyngoesophageal phase: Secondary | ICD-10-CM | POA: Diagnosis not present

## 2019-05-01 ENCOUNTER — Other Ambulatory Visit: Payer: Self-pay | Admitting: Gastroenterology

## 2019-05-01 DIAGNOSIS — R1314 Dysphagia, pharyngoesophageal phase: Secondary | ICD-10-CM

## 2019-05-06 ENCOUNTER — Ambulatory Visit
Admission: RE | Admit: 2019-05-06 | Discharge: 2019-05-06 | Disposition: A | Discharge status: Home / Self Care | Payer: 59 | Source: Ambulatory Visit | Attending: Gastroenterology | Admitting: Gastroenterology

## 2019-05-06 DIAGNOSIS — K224 Dyskinesia of esophagus: Secondary | ICD-10-CM | POA: Diagnosis not present

## 2019-05-06 DIAGNOSIS — R1314 Dysphagia, pharyngoesophageal phase: Secondary | ICD-10-CM

## 2019-05-18 MED FILL — TELMISARTAN-HCTZ 80-25 MG T: 80-25 | 30 days supply | Qty: 30 | Fill #0

## 2019-05-20 DIAGNOSIS — Z09 Encounter for follow-up examination after completed treatment for conditions other than malignant neoplasm: Secondary | ICD-10-CM | POA: Diagnosis not present

## 2019-05-20 DIAGNOSIS — R1314 Dysphagia, pharyngoesophageal phase: Secondary | ICD-10-CM | POA: Diagnosis not present

## 2019-05-20 MED FILL — AMLODIPINE BESYLATE 10 MG T: 10 | 90 days supply | Qty: 90 | Fill #1

## 2019-06-01 MED FILL — AMOXICILLIN 500 MG CAPSULE: 500 | 4 days supply | Qty: 8 | Fill #0

## 2019-06-11 DIAGNOSIS — Z8739 Personal history of other diseases of the musculoskeletal system and connective tissue: Secondary | ICD-10-CM | POA: Diagnosis not present

## 2019-06-11 DIAGNOSIS — Z01419 Encounter for gynecological examination (general) (routine) without abnormal findings: Secondary | ICD-10-CM | POA: Diagnosis not present

## 2019-06-12 MED FILL — AMLODIPINE BESYLATE 10 MG T: 10 | 90 days supply | Qty: 90 | Fill #1

## 2019-06-18 MED FILL — metFORMIN HCL 1000 MG TABS: 1000 | 90 days supply | Qty: 180 | Fill #0

## 2019-06-18 MED FILL — TELMISARTAN-HCTZ 80-25 MG T: 80-25 | 30 days supply | Qty: 30 | Fill #1

## 2019-06-24 DIAGNOSIS — Z8739 Personal history of other diseases of the musculoskeletal system and connective tissue: Secondary | ICD-10-CM | POA: Diagnosis not present

## 2019-06-25 IMAGING — CT CT ABD-PELV W/ CM
3 of 5 series · 12 of 36 positions shown, 18 images · IV contrast (WATER & [ID] ISOVUE 300)
Comparison: 01/07/2008

CLINICAL DATA: Lower abdominal pain. Pelvic pressure for 3 days.
Fall with left-sided injury 06/29/2017

EXAM:
CT ABDOMEN AND PELVIS WITH CONTRAST
TECHNIQUE: Multidetector CT imaging of the abdomen and pelvis was performed
using the standard protocol following bolus administration of
intravenous contrast.
CONTRAST:  100mL L0ZR51-2RR IOPAMIDOL (L0ZR51-2RR) INJECTION 61%

[Series 3: abd/pelvis with · axial · 0.70mm/px · z∈[-326,-56]mm · 7 of 74 slices shown, 12 images]
[im 10/74  soft-tissue]
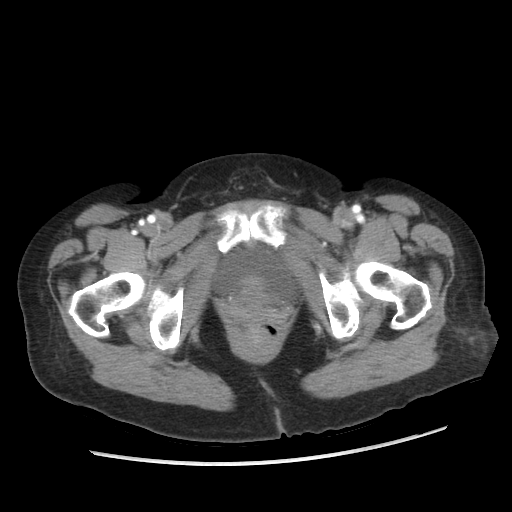
[im 10/74  bone]
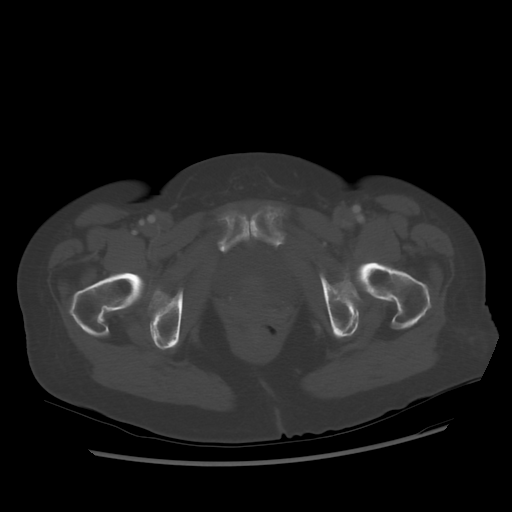
[im 19/74  soft-tissue]
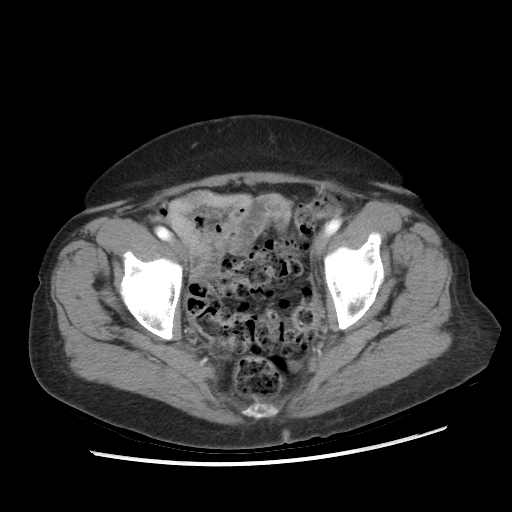
[im 28/74  soft-tissue]
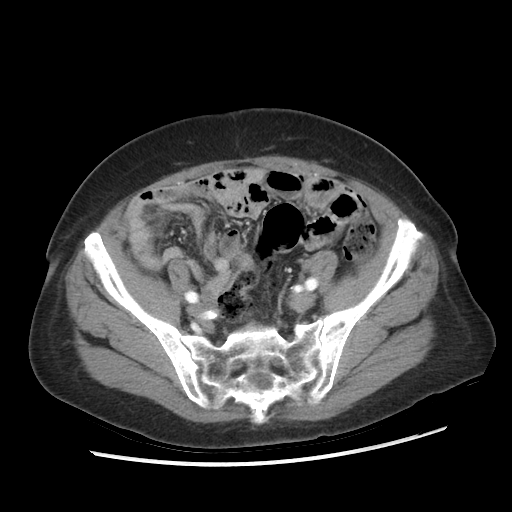
[im 37/74  soft-tissue]
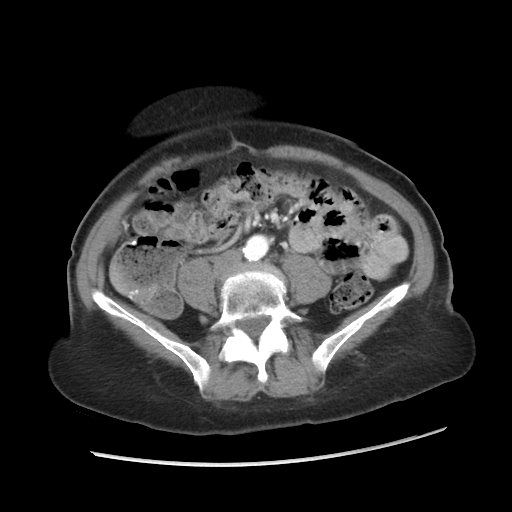
[im 37/74  lung]
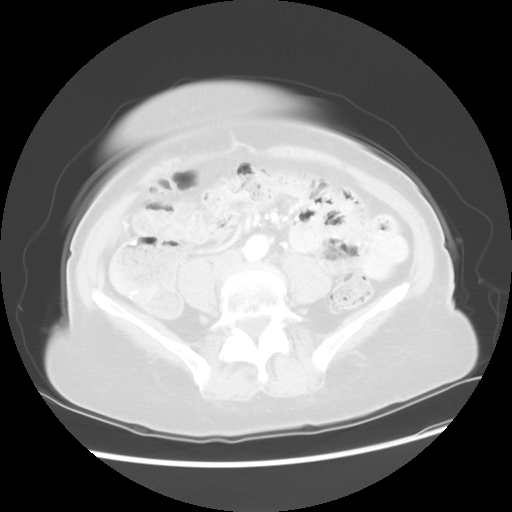
[im 46/74  soft-tissue]
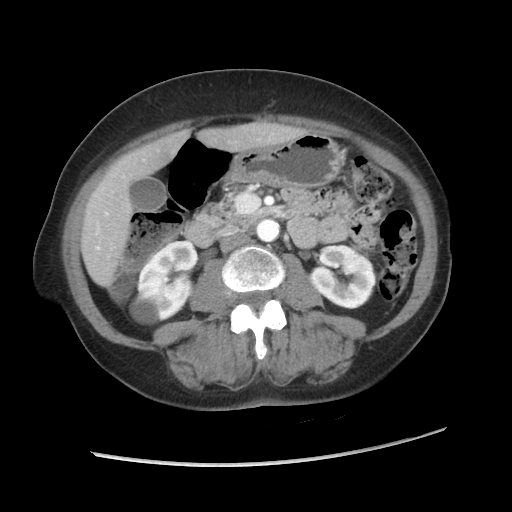
[im 46/74  lung]
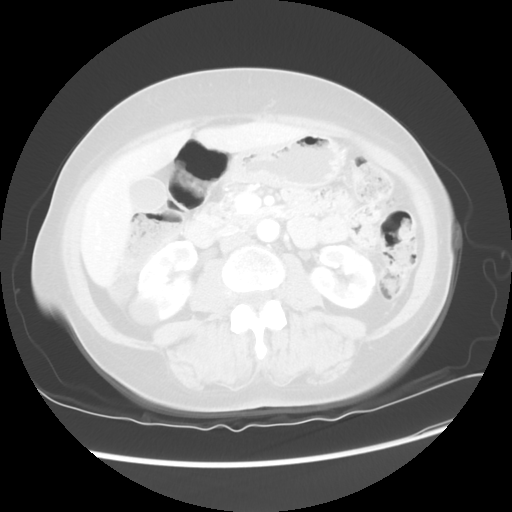
[im 55/74  soft-tissue]
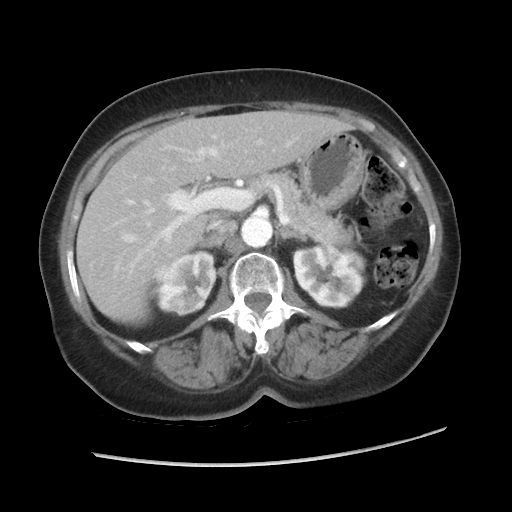
[im 55/74  lung]
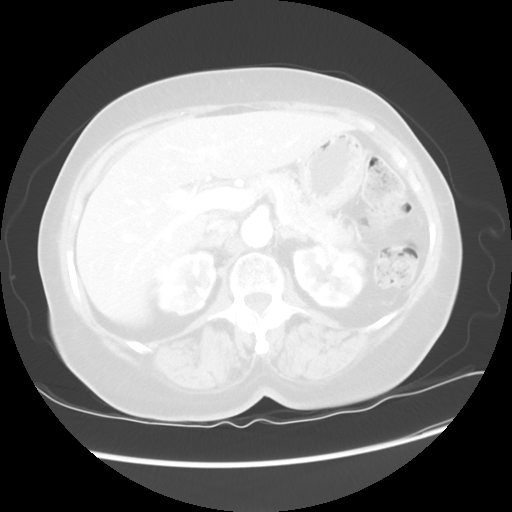
[im 64/74  soft-tissue]
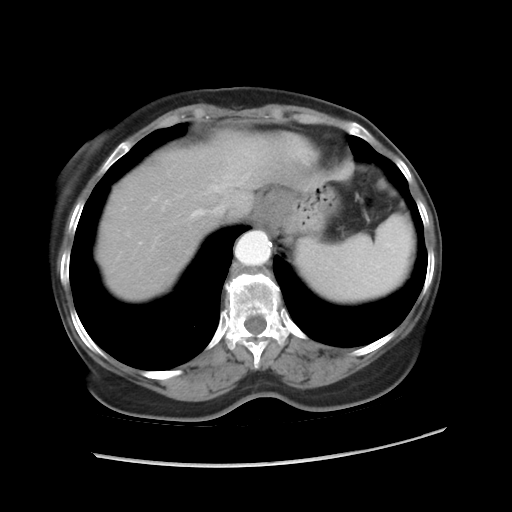
[im 64/74  lung]
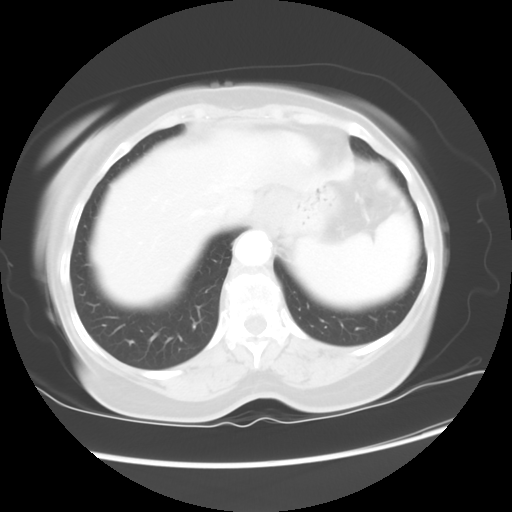

[Series 601: coronal body · coronal · 0.75mm/px · 1 of 116 slices shown, 2 images]
[im 39/116  soft-tissue]
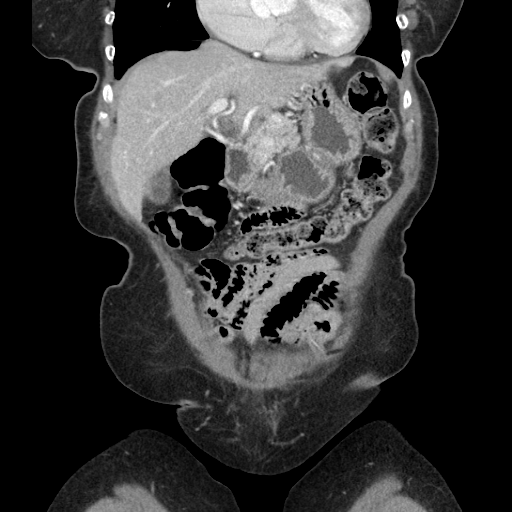
[im 39/116  bone]
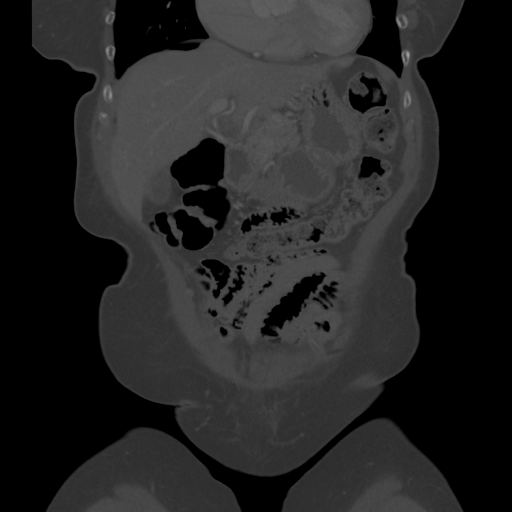

[Series 602: sagittal body · sagittal · 0.75mm/px · 4 of 142 slices shown]
[im 10/142  soft-tissue]
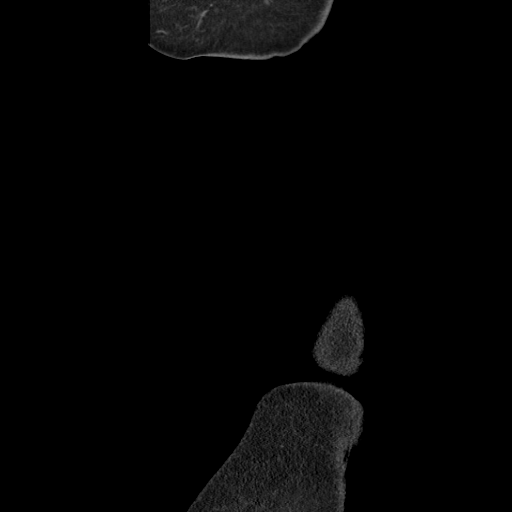
[im 29/142  soft-tissue]
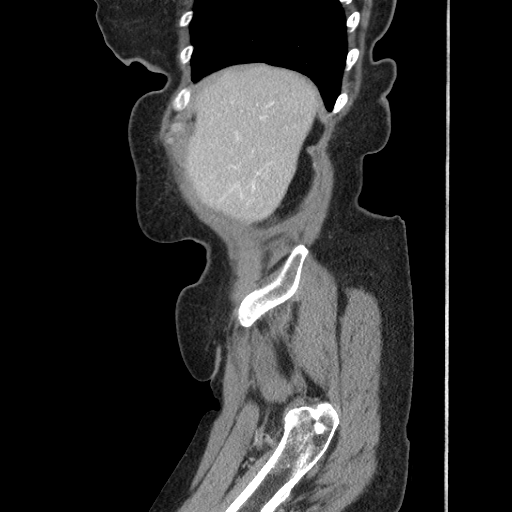
[im 48/142  soft-tissue]
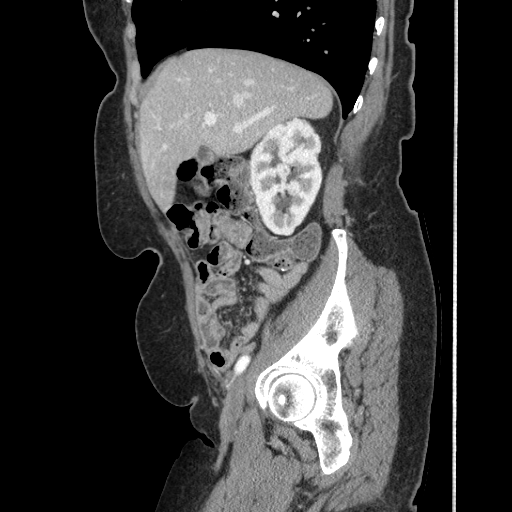
[im 66/142  soft-tissue]
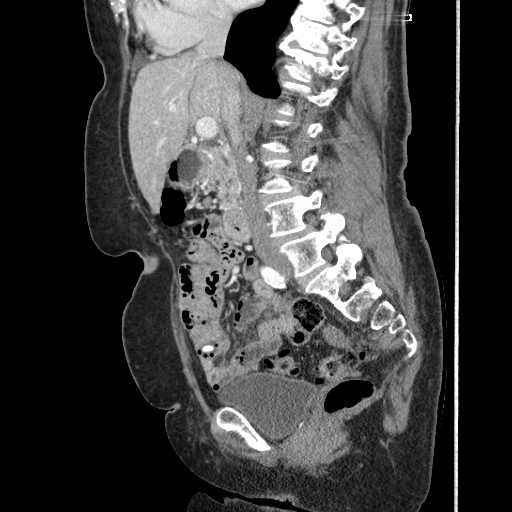

[12 of 36 positions shown; findings below may reference images not displayed]

FINDINGS: Lower chest: 3 mm nodule in the right lower lobe, below size
threshold for strict imaging follow-up but new or larger than at

Hepatobiliary: No focal liver abnormality.No evidence of biliary
obstruction or stone.

Pancreas: Unremarkable.

Spleen: Unremarkable.

Adrenals/Urinary Tract: Negative adrenals. No hydronephrosis or
stone. Patchy bilateral renal cortical excavation consistent with
scarring. There are also small bilateral cortical cysts.
Unremarkable bladder.

Stomach/Bowel: No obstruction or inflammation. Innumerable distal
colonic diverticula. Prior proximal colon resection for polyp.
High-density within the distal small bowel lumen is likely ingested
medication given rounded shape.

Vascular/Lymphatic: Atherosclerosis of the aorta and its branches.
Major vessels are patent. No mass or adenopathy.

Reproductive:Hysterectomy.  Negative adnexae.

Other: No ascites or pneumoperitoneum.

Musculoskeletal: Suspect mild soft tissue contusion lateral to the
left hip. No underlying fracture or dislocation. Lumbar facet
arthropathy with grade 1 anterolisthesis at L4-5.
IMPRESSION: 1. No acute intra-abdominal finding.
2. Recent fall with suspected mild soft tissue contusion lateral to
the left hip. Negative for fracture.
3. Extensive distal colonic diverticulosis.
4. Bilateral renal cortical scarring.
5. 3 mm nodule in the right lower lobe that has developed/enlarged
patient is high-risk. This recommendation follows the consensus
statement: Guidelines for Management of Incidental Pulmonary Nodules
Detected on CT Images: From the [HOSPITAL] 8300; Radiology

## 2019-07-20 MED FILL — TELMISARTAN-HCTZ 80-25 MG T: 80-25 | 30 days supply | Qty: 30 | Fill #2

## 2019-07-20 MED FILL — PRAVASTATIN SODIUM 20 MG TA: 20 | 90 days supply | Qty: 90 | Fill #1

## 2019-08-04 ENCOUNTER — Other Ambulatory Visit: Payer: Self-pay | Admitting: *Deleted

## 2019-08-04 DIAGNOSIS — I712 Thoracic aortic aneurysm, without rupture, unspecified: Secondary | ICD-10-CM

## 2019-08-17 MED FILL — TELMISARTAN-HCTZ 80-25 MG T: 80-25 | 30 days supply | Qty: 30 | Fill #3

## 2019-09-02 DIAGNOSIS — Z1231 Encounter for screening mammogram for malignant neoplasm of breast: Secondary | ICD-10-CM | POA: Diagnosis not present

## 2019-09-14 MED FILL — AMLODIPINE BESYLATE 10 MG T: 10 | 90 days supply | Qty: 90 | Fill #2

## 2019-09-14 MED FILL — TELMISARTAN-HCTZ 80-25 MG T: 80-25 | 30 days supply | Qty: 30 | Fill #4

## 2019-09-16 ENCOUNTER — Telehealth: Payer: Self-pay | Admitting: Cardiothoracic Surgery

## 2019-09-16 ENCOUNTER — Ambulatory Visit
Admission: RE | Admit: 2019-09-16 | Discharge: 2019-09-16 | Disposition: A | Discharge status: Home / Self Care | Payer: 59 | Source: Ambulatory Visit | Attending: Cardiothoracic Surgery | Admitting: Cardiothoracic Surgery

## 2019-09-16 ENCOUNTER — Encounter: Payer: 59 | Admitting: Cardiothoracic Surgery

## 2019-09-16 ENCOUNTER — Other Ambulatory Visit: Payer: Self-pay

## 2019-09-16 VITALS — Ht 62.0 in | Wt 130.0 lb

## 2019-09-16 DIAGNOSIS — I712 Thoracic aortic aneurysm, without rupture, unspecified: Secondary | ICD-10-CM

## 2019-09-16 DIAGNOSIS — I77810 Thoracic aortic ectasia: Secondary | ICD-10-CM

## 2019-09-16 DIAGNOSIS — L600 Ingrowing nail: Secondary | ICD-10-CM | POA: Diagnosis not present

## 2019-09-16 DIAGNOSIS — M25774 Osteophyte, right foot: Secondary | ICD-10-CM | POA: Diagnosis not present

## 2019-09-16 MED ORDER — GADOBENATE DIMEGLUMINE 529 MG/ML IV SOLN
12.0000 mL | Freq: Once | INTRAVENOUS | Status: AC | PRN
  Administered 2019-09-16: 08:00:00 12 mL via INTRAVENOUS

## 2019-09-16 MED FILL — CEPHALEXIN 500 MG CAPSULE: 500 | 7 days supply | Qty: 14 | Fill #0

## 2019-09-16 NOTE — Telephone Encounter (Signed)
[address]       [place] [address]             [phone]     CARDIOTHORACIC SURGERY TELEPHONE VIRTUAL OFFICE NOTE  Referring Provider is No ref. provider found Primary Cardiologist is No primary care provider on file. PCP is Lavone Orn, MD   HPI:  I spoke with Savannah Lewis (DOB [birth year] ) via telephone on 09/16/2019 at 10:22 AM and verified that I was speaking with the correct person using more than one form of identification.  We discussed the reason(s) for conducting our visit virtually instead of in-person.  The patient expressed understanding the circumstances and agreed to proceed as described.  4-month follow-up with MRA of her thoracic aorta for a 4.0 cm fusiform asymptomatic ascending aneurysm unchanged since 2014.  I reviewed the images of her MRI personally which shows the ascending aorta to have a smooth dilatation without heavy calcification ulceration or hematoma.  The patient has been taking her blood pressure medications including his Norvasc and Micardis.  She records her blood pressure at home and also has her blood pressure checked by her primary physician Dr. Laurann Montana regularly.  She has been under good control.  She denies chest pain. September 2020 she had a shoulder replacement by Dr. Linda Hedges in did well without any cardiac problems.  She is still working at [employer] and tries to walk 15 minutes every other day.  She is not currently going to a gym because of the Covid pandemic. Current Outpatient Medications  Medication Sig Dispense Refill  . amLODipine (NORVASC) 10 MG tablet Take 10 mg by mouth every morning.     . calcium carbonate (OSCAL) 1500 (600 Ca) MG TABS tablet Take 600 mg of elemental calcium by mouth daily with breakfast.     . hydroxypropyl methylcellulose / hypromellose (ISOPTO TEARS / GONIOVISC) 2.5 % ophthalmic solution Place 1 drop into both eyes daily.    . metFORMIN (GLUCOPHAGE) 1000 MG tablet Take  1,000 mg by mouth 2 (two) times daily with a meal.    . Multiple Vitamins-Minerals (MULTIVITAMIN WITH MINERALS) tablet Take 1 tablet by mouth daily.    Marland Kitchen telmisartan-hydrochlorothiazide (MICARDIS HCT) 80-25 MG tablet Take 1 tablet by mouth daily.     No current facility-administered medications for this visit.     Diagnostic Tests:  MRI images personally reviewed and discussed as noted above   Impression:  Stable mild-moderate fusiform ascending aneurysm stable 4.0 centimeters for 7 years.  Plan:  Risk for dissection remains very low.  She does not meet criteria for surgery.  Best therapy is continued blood pressure monitoring and control to keep systolic blood pressure less than 150.  Will plan a surveillance scan in another 18 months.    I discussed limitations of evaluation and management via telephone.  The patient was advised to call back for repeat telephone consultation or to seek an in-person evaluation if questions arise or the patient's clinical condition changes in any significant manner.  I spent in excess of 5 minutes of non-face-to-face time during the conduct of this telephone virtual office consultation.  Level 1  (99441)             5-10 minutes Level 2  (99442)            11-20 minutes Level 3  (99443)            21-30 minutes   09/16/2019 10:22  AM     

## 2019-09-22 ENCOUNTER — Telehealth: Payer: Self-pay | Admitting: Cardiothoracic Surgery

## 2019-09-22 NOTE — Telephone Encounter (Signed)
      [address]       [place] [address]             [phone]     CARDIOTHORACIC SURGERY TELEPHONE VIRTUAL OFFICE NOTE  Referring Provider is No ref. provider found Primary Cardiologist is No primary care provider on file. PCP is Lavone Orn, MD   HPI:  I spoke with Savannah Lewis (DOB 04-07-1941 ) via telephone on 09/22/2019 at 12:18 PM and verified that I was speaking with the correct person using more than one form of identification.  We discussed the reason(s) for conducting our visit virtually instead of in-person.  The patient expressed understanding the circumstances and agreed to proceed as described.  Called after surveillance scan of stable asymptomatic ascending aneurysm - followed 7 years  Denies chest pain Compliant with BD meds   Current Outpatient Medications  Medication Sig Dispense Refill  . amLODipine (NORVASC) 10 MG tablet Take 10 mg by mouth every morning.     . calcium carbonate (OSCAL) 1500 (600 Ca) MG TABS tablet Take 600 mg of elemental calcium by mouth daily with breakfast.     . hydroxypropyl methylcellulose / hypromellose (ISOPTO TEARS / GONIOVISC) 2.5 % ophthalmic solution Place 1 drop into both eyes daily.    . metFORMIN (GLUCOPHAGE) 1000 MG tablet Take 1,000 mg by mouth 2 (two) times daily with a meal.    . Multiple Vitamins-Minerals (MULTIVITAMIN WITH MINERALS) tablet Take 1 tablet by mouth daily.    Marland Kitchen telmisartan-hydrochlorothiazide (MICARDIS HCT) 80-25 MG tablet Take 1 tablet by mouth daily.     No current facility-administered medications for this visit.     Diagnostic Tests:  Stable 4 cm fusiform ascending thoracic aneurysm No hematoma or ulceration  Impression:  Remains low risk for dissection < 1% Does not meet criteria for surgery unless diameter 5.5 cm  Plan:  Surveillance scan of thoracic aorta in 1.5 years    I discussed limitations of evaluation and management via telephone.  The patient was advised  to call back for repeat telephone consultation or to seek an in-person evaluation if questions arise or the patient's clinical condition changes in any significant manner.  I spent in excess of 5 minutes of non-face-to-face time during the conduct of this telephone virtual office consultation.  Level 1  (99441)             5-10 minutes Level 2  (99442)            11-20 minutes Level 3  (99443)            21-30 minutes    09/22/2019 12:18 PM

## 2019-09-28 DIAGNOSIS — I700 Atherosclerosis of aorta: Secondary | ICD-10-CM | POA: Diagnosis not present

## 2019-09-28 DIAGNOSIS — I499 Cardiac arrhythmia, unspecified: Secondary | ICD-10-CM | POA: Diagnosis not present

## 2019-09-28 DIAGNOSIS — E1169 Type 2 diabetes mellitus with other specified complication: Secondary | ICD-10-CM | POA: Diagnosis not present

## 2019-09-28 DIAGNOSIS — M8580 Other specified disorders of bone density and structure, unspecified site: Secondary | ICD-10-CM | POA: Diagnosis not present

## 2019-09-28 DIAGNOSIS — I10 Essential (primary) hypertension: Secondary | ICD-10-CM | POA: Diagnosis not present

## 2019-09-30 DIAGNOSIS — M79674 Pain in right toe(s): Secondary | ICD-10-CM | POA: Diagnosis not present

## 2019-09-30 DIAGNOSIS — M79675 Pain in left toe(s): Secondary | ICD-10-CM | POA: Diagnosis not present

## 2019-09-30 DIAGNOSIS — B351 Tinea unguium: Secondary | ICD-10-CM | POA: Diagnosis not present

## 2019-09-30 DIAGNOSIS — L600 Ingrowing nail: Secondary | ICD-10-CM | POA: Diagnosis not present

## 2019-09-30 MED FILL — TERBINAFINE HCL 250 MG TAB: 250 | 30 days supply | Qty: 30 | Fill #0

## 2019-10-15 MED FILL — ACCU-CHEK AVIVA PLUS STRP: 90 days supply | Qty: 100 | Fill #0

## 2019-10-15 MED FILL — ACCU-CHEK SOFTCLIX LANCETS: 90 days supply | Qty: 100 | Fill #0

## 2019-10-15 MED FILL — TELMISARTAN-HCTZ 80-25 MG T: 80-25 | 30 days supply | Qty: 30 | Fill #5

## 2019-11-02 DIAGNOSIS — M79675 Pain in left toe(s): Secondary | ICD-10-CM | POA: Diagnosis not present

## 2019-11-02 DIAGNOSIS — M79674 Pain in right toe(s): Secondary | ICD-10-CM | POA: Diagnosis not present

## 2019-11-02 DIAGNOSIS — B351 Tinea unguium: Secondary | ICD-10-CM | POA: Diagnosis not present

## 2019-11-02 DIAGNOSIS — L600 Ingrowing nail: Secondary | ICD-10-CM | POA: Diagnosis not present

## 2019-11-02 MED FILL — TERBINAFINE HCL 250 MG TAB: 250 | 30 days supply | Qty: 30 | Fill #0

## 2019-11-11 DIAGNOSIS — N764 Abscess of vulva: Secondary | ICD-10-CM | POA: Diagnosis not present

## 2019-11-19 MED FILL — TELMISARTAN-HCTZ 80-25 MG T: 80-25 | 30 days supply | Qty: 30 | Fill #6

## 2019-11-30 DIAGNOSIS — L600 Ingrowing nail: Secondary | ICD-10-CM | POA: Diagnosis not present

## 2019-11-30 DIAGNOSIS — M79674 Pain in right toe(s): Secondary | ICD-10-CM | POA: Diagnosis not present

## 2019-11-30 DIAGNOSIS — B351 Tinea unguium: Secondary | ICD-10-CM | POA: Diagnosis not present

## 2019-11-30 DIAGNOSIS — M79675 Pain in left toe(s): Secondary | ICD-10-CM | POA: Diagnosis not present

## 2019-11-30 MED FILL — TERBINAFINE HCL 250 MG TAB: 250 | 30 days supply | Qty: 30 | Fill #0

## 2019-12-02 MED FILL — METFORMIN HCL 1000 MG TABS: 1000 | 90 days supply | Qty: 180 | Fill #1

## 2019-12-09 MED FILL — AMLODIPINE BESYLATE 10 MG T: 10 | 90 days supply | Qty: 90 | Fill #3

## 2019-12-21 MED FILL — TELMISARTAN-HCTZ 80-25 MG T: 80-25 | 30 days supply | Qty: 30 | Fill #7

## 2020-01-13 DIAGNOSIS — B351 Tinea unguium: Secondary | ICD-10-CM | POA: Diagnosis not present

## 2020-01-13 DIAGNOSIS — M79675 Pain in left toe(s): Secondary | ICD-10-CM | POA: Diagnosis not present

## 2020-01-13 DIAGNOSIS — L600 Ingrowing nail: Secondary | ICD-10-CM | POA: Diagnosis not present

## 2020-01-13 DIAGNOSIS — M79674 Pain in right toe(s): Secondary | ICD-10-CM | POA: Diagnosis not present

## 2020-01-20 MED FILL — TELMISARTAN-HCTZ 80-25 MG T: 80-25 | 30 days supply | Qty: 30 | Fill #8

## 2020-02-17 MED FILL — TELMISARTAN-HCTZ 80-25 MG T: 80-25 | 30 days supply | Qty: 30 | Fill #9

## 2020-03-17 ENCOUNTER — Other Ambulatory Visit (HOSPITAL_COMMUNITY): Payer: Self-pay | Admitting: Internal Medicine

## 2020-03-17 MED FILL — AMLODIPINE BESYLATE 10 MG T: 10 | 90 days supply | Qty: 90 | Fill #0

## 2020-03-17 MED FILL — TELMISARTAN-HCTZ 80-25 MG T: 80-25 | 30 days supply | Qty: 30 | Fill #10

## 2020-03-31 DIAGNOSIS — E1169 Type 2 diabetes mellitus with other specified complication: Secondary | ICD-10-CM | POA: Diagnosis not present

## 2020-03-31 DIAGNOSIS — Z0000 Encounter for general adult medical examination without abnormal findings: Secondary | ICD-10-CM | POA: Diagnosis not present

## 2020-03-31 DIAGNOSIS — I712 Thoracic aortic aneurysm, without rupture: Secondary | ICD-10-CM | POA: Diagnosis not present

## 2020-03-31 DIAGNOSIS — I700 Atherosclerosis of aorta: Secondary | ICD-10-CM | POA: Diagnosis not present

## 2020-03-31 DIAGNOSIS — I10 Essential (primary) hypertension: Secondary | ICD-10-CM | POA: Diagnosis not present

## 2020-03-31 DIAGNOSIS — M8580 Other specified disorders of bone density and structure, unspecified site: Secondary | ICD-10-CM | POA: Diagnosis not present

## 2020-04-01 ENCOUNTER — Other Ambulatory Visit: Payer: Self-pay | Admitting: Internal Medicine

## 2020-04-01 DIAGNOSIS — M8580 Other specified disorders of bone density and structure, unspecified site: Secondary | ICD-10-CM

## 2020-04-05 ENCOUNTER — Other Ambulatory Visit: Payer: Self-pay

## 2020-04-05 ENCOUNTER — Ambulatory Visit
Admission: RE | Admit: 2020-04-05 | Discharge: 2020-04-05 | Disposition: A | Discharge status: Home / Self Care | Payer: 59 | Source: Ambulatory Visit | Attending: Internal Medicine | Admitting: Internal Medicine

## 2020-04-05 DIAGNOSIS — Z780 Asymptomatic menopausal state: Secondary | ICD-10-CM | POA: Diagnosis not present

## 2020-04-05 DIAGNOSIS — M8580 Other specified disorders of bone density and structure, unspecified site: Secondary | ICD-10-CM

## 2020-04-05 DIAGNOSIS — M8589 Other specified disorders of bone density and structure, multiple sites: Secondary | ICD-10-CM | POA: Diagnosis not present

## 2020-04-19 MED FILL — TELMISARTAN-HCTZ 80-25 MG T: 80-25 | 30 days supply | Qty: 30 | Fill #11

## 2020-04-20 DIAGNOSIS — M7989 Other specified soft tissue disorders: Secondary | ICD-10-CM | POA: Diagnosis not present

## 2020-04-20 DIAGNOSIS — M79674 Pain in right toe(s): Secondary | ICD-10-CM | POA: Diagnosis not present

## 2020-04-20 DIAGNOSIS — M79675 Pain in left toe(s): Secondary | ICD-10-CM | POA: Diagnosis not present

## 2020-04-20 DIAGNOSIS — B351 Tinea unguium: Secondary | ICD-10-CM | POA: Diagnosis not present

## 2020-04-21 DIAGNOSIS — M25511 Pain in right shoulder: Secondary | ICD-10-CM | POA: Diagnosis not present

## 2020-05-16 ENCOUNTER — Other Ambulatory Visit (HOSPITAL_COMMUNITY): Payer: Self-pay | Admitting: Internal Medicine

## 2020-05-16 MED FILL — TELMISARTAN-HCTZ 80-12.5 MG: 80-12.5 | 90 days supply | Qty: 90 | Fill #0

## 2020-05-16 MED FILL — METFORMIN HCL 1000 MG TABS: 1000 | 90 days supply | Qty: 180 | Fill #2

## 2020-05-26 DIAGNOSIS — H524 Presbyopia: Secondary | ICD-10-CM | POA: Diagnosis not present

## 2020-06-13 MED FILL — AMLODIPINE BESYLATE 10 MG T: 10 | 90 days supply | Qty: 90 | Fill #1

## 2020-06-29 ENCOUNTER — Other Ambulatory Visit (HOSPITAL_COMMUNITY): Payer: Self-pay | Admitting: Optometry

## 2020-06-29 DIAGNOSIS — H00021 Hordeolum internum right upper eyelid: Secondary | ICD-10-CM | POA: Diagnosis not present

## 2020-06-30 MED FILL — DOXYCYCLINE HYCLATE 100 MG: 100 | 7 days supply | Qty: 14 | Fill #0

## 2020-06-30 MED FILL — ACCU-CHEK AVIVA PLUS STRP: 90 days supply | Qty: 100 | Fill #1

## 2020-06-30 MED FILL — ACCU-CHEK SOFTCLIX LANCETS: 90 days supply | Qty: 100 | Fill #1

## 2020-07-07 ENCOUNTER — Other Ambulatory Visit (HOSPITAL_COMMUNITY): Payer: Self-pay | Admitting: Optometry

## 2020-07-08 MED FILL — AMOX-CLAV 875-125 MG TABLET: 875-125 | 10 days supply | Qty: 20 | Fill #0

## 2020-07-20 DIAGNOSIS — H9313 Tinnitus, bilateral: Secondary | ICD-10-CM | POA: Diagnosis not present

## 2020-08-16 MED FILL — TELMISARTAN-HCTZ 80-12.5 MG: 80-12.5 | 90 days supply | Qty: 90 | Fill #1

## 2020-09-01 DIAGNOSIS — H5213 Myopia, bilateral: Secondary | ICD-10-CM | POA: Diagnosis not present

## 2020-09-07 DIAGNOSIS — Z1231 Encounter for screening mammogram for malignant neoplasm of breast: Secondary | ICD-10-CM | POA: Diagnosis not present

## 2020-09-12 DIAGNOSIS — L600 Ingrowing nail: Secondary | ICD-10-CM | POA: Diagnosis not present

## 2020-09-13 MED FILL — AMLODIPINE BESYLATE 10 MG T: 10 | 90 days supply | Qty: 90 | Fill #2

## 2020-10-11 DIAGNOSIS — Z7984 Long term (current) use of oral hypoglycemic drugs: Secondary | ICD-10-CM | POA: Diagnosis not present

## 2020-10-11 DIAGNOSIS — I10 Essential (primary) hypertension: Secondary | ICD-10-CM | POA: Diagnosis not present

## 2020-10-11 DIAGNOSIS — E1169 Type 2 diabetes mellitus with other specified complication: Secondary | ICD-10-CM | POA: Diagnosis not present

## 2020-11-08 ENCOUNTER — Other Ambulatory Visit (HOSPITAL_BASED_OUTPATIENT_CLINIC_OR_DEPARTMENT_OTHER): Payer: Self-pay

## 2020-11-28 ENCOUNTER — Other Ambulatory Visit (HOSPITAL_COMMUNITY): Payer: Self-pay

## 2020-11-28 MED ORDER — TELMISARTAN-HCTZ 80-12.5 MG PO TABS
ORAL_TABLET | ORAL | 3 refills | Status: DC
  Filled 2020-11-28: qty 90, 90d supply, fill #0
  Filled 2021-03-07: qty 90, 90d supply, fill #1
  Filled 2021-05-31: qty 90, 90d supply, fill #2
  Filled 2021-09-13: qty 90, 90d supply, fill #3

## 2020-11-30 ENCOUNTER — Other Ambulatory Visit (HOSPITAL_COMMUNITY): Payer: Self-pay

## 2020-12-07 ENCOUNTER — Other Ambulatory Visit (HOSPITAL_COMMUNITY): Payer: Self-pay

## 2020-12-07 DIAGNOSIS — I872 Venous insufficiency (chronic) (peripheral): Secondary | ICD-10-CM | POA: Diagnosis not present

## 2020-12-07 DIAGNOSIS — E1169 Type 2 diabetes mellitus with other specified complication: Secondary | ICD-10-CM | POA: Diagnosis not present

## 2020-12-07 MED ORDER — METFORMIN HCL 500 MG PO TABS
ORAL_TABLET | ORAL | 3 refills | Status: DC
  Filled 2020-12-07: qty 180, 90d supply, fill #0
  Filled 2021-02-23: qty 180, 90d supply, fill #1
  Filled 2021-05-31: qty 180, 90d supply, fill #2

## 2020-12-07 MED ORDER — FUROSEMIDE 40 MG PO TABS
ORAL_TABLET | ORAL | 0 refills | Status: DC
  Filled 2020-12-07: qty 3, 3d supply, fill #0

## 2020-12-08 ENCOUNTER — Other Ambulatory Visit (HOSPITAL_COMMUNITY): Payer: Self-pay

## 2020-12-15 DIAGNOSIS — M25511 Pain in right shoulder: Secondary | ICD-10-CM | POA: Diagnosis not present

## 2020-12-15 DIAGNOSIS — Z96652 Presence of left artificial knee joint: Secondary | ICD-10-CM | POA: Diagnosis not present

## 2020-12-16 ENCOUNTER — Other Ambulatory Visit (HOSPITAL_COMMUNITY): Payer: Self-pay

## 2020-12-16 MED FILL — Amlodipine Besylate Tab 10 MG (Base Equivalent): ORAL | 90 days supply | Qty: 90 | Fill #0 | Status: AC

## 2021-01-04 DIAGNOSIS — Z96652 Presence of left artificial knee joint: Secondary | ICD-10-CM | POA: Diagnosis not present

## 2021-02-14 ENCOUNTER — Encounter (HOSPITAL_COMMUNITY): Payer: Self-pay | Admitting: Emergency Medicine

## 2021-02-14 ENCOUNTER — Ambulatory Visit (HOSPITAL_COMMUNITY)
Admission: EM | Admit: 2021-02-14 | Discharge: 2021-02-14 | Disposition: A | Discharge status: Home / Self Care | Payer: 59 | Attending: Family Medicine | Admitting: Family Medicine

## 2021-02-14 ENCOUNTER — Other Ambulatory Visit (HOSPITAL_COMMUNITY): Payer: Self-pay

## 2021-02-14 ENCOUNTER — Other Ambulatory Visit: Payer: Self-pay

## 2021-02-14 DIAGNOSIS — H00014 Hordeolum externum left upper eyelid: Secondary | ICD-10-CM | POA: Diagnosis not present

## 2021-02-14 MED ORDER — ERYTHROMYCIN 5 MG/GM OP OINT
TOPICAL_OINTMENT | OPHTHALMIC | 0 refills | Status: DC
  Filled 2021-02-14: qty 3.5, 7d supply, fill #0

## 2021-02-14 NOTE — ED Triage Notes (Signed)
Pt is present today with left eye pain and drainage. Pt states that she noticed the pain started Sunday.

## 2021-02-14 NOTE — ED Provider Notes (Signed)
[place]    CSN: [id number] Arrival date & time: 02/14/21  1649      History   Chief Complaint Chief Complaint  Patient presents with   Eye Pain    HPI Savannah Lewis is a 80 y.o. female.   Patient presenting today with 4 to 5-day history of worsening left upper eyelid swelling, tenderness, and now this morning yellow drainage coming from the area.  No known injury to the eye, fever, chills, headache, visual changes.  Has been trying warm compresses with minimal relief.   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   S/P knee replacement 08/31/2014   Thoracic ascending aortic aneurysm ([place])    Hypertension    Arthritis    Diabetes ([place])    Diabetes mellitus ([place]) 09/06/2012   HTN (hypertension) 09/06/2012    Past Surgical History:  Procedure Laterality Date   ABDOMINAL HYSTERECTOMY  1995   W/ UNILATERAL SALPINGOOPHORECTOMY   COLONOSCOPY N/A 04/14/2013   Procedure: COLONOSCOPY;  Surgeon: Garlan Fair, MD;  [facility]: [facility];  Service: Endoscopy;   Laterality: N/A;   DORSAL COMPARTMENT RELEASE Left 09/24/2013   Procedure: LEFT WRIST FIRST DORSAL COMPARTMENT TENOSYNOVITIS;  Surgeon: Linna [place], MD;  Location: [place];  Service: Orthopedics;  Laterality: Left;  ANESTHESIA: LOCAL/IV SEDATION   JOINT REPLACEMENT     KNEE ARTHROSCOPY Bilateral RIGHT 2002 & 1994/   LEFT  1995   LAPAROSCOPY ASSISTED  RIGHT COLECTOMY  03-22-2005   CECUM POLYP   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/2018   Procedure: REVERSE LEFT SHOULDER ARTHROPLASTY;  Surgeon: Netta Cedars, MD;  Location: [place];  Service: Orthopedics;  Laterality: Left;   ROTATOR CUFF REPAIR Right 08-31-2008   TOTAL KNEE ARTHROPLASTY Right 07-01-2009   TOTAL KNEE ARTHROPLASTY Left 08/31/2014   Procedure: LEFT TOTAL KNEE ARTHROPLASTY;  Surgeon: Sydnee Cabal, MD;  Location: [facility];  Service: Orthopedics;  Laterality: Left;    OB History   No obstetric history on file.      Home Medications    Prior to Admission medications   Medication Sig Start Date End Date Taking? Authorizing Provider  erythromycin ophthalmic ointment Place a 1/2 inch ribbon of ointment into the left lower eyelid twice daily. 02/14/21  Yes Volney American, PA-C  amLODipine (NORVASC) 10 MG tablet Take 10 mg by mouth every morning.     [provider]  amLODipine (NORVASC) 10 MG tablet TAKE 1 TABLET BY MOUTH ONCE DAILY 03/17/20 03/17/21  Lavone Orn, MD  amoxicillin-clavulanate (AUGMENTIN) 875-125 MG tablet TAKE 1 TABLET BY MOUTH EVERY 12 HOURS FOR 10 DAYS 07/07/20 07/07/21  Laurence Aly, OD  calcium carbonate (OSCAL) 1500 (600 Ca) MG TABS tablet Take 600 mg of elemental calcium by mouth daily with breakfast.     [provider]  doxycycline (VIBRA-TABS) 100 MG tablet TAKE 1 TABLET BY MOUTH EVERY 12 HOURS FOR 7 DAYS 06/29/20 06/29/21  Laurence Aly, OD  furosemide (LASIX) 40 MG tablet Take 1 tablet by mouth once daily for 3 days 12/07/20     hydroxypropyl methylcellulose /  hypromellose (ISOPTO TEARS / GONIOVISC) 2.5 % ophthalmic solution Place 1 drop into both eyes daily.    [provider]  metFORMIN (GLUCOPHAGE) 1000 MG tablet Take 1,000 mg by mouth 2 (two) times daily with a meal.    [provider]  metFORMIN (GLUCOPHAGE) 500 MG tablet Take 2 tablets by mouth twice daily 12/07/20     Multiple Vitamins-Minerals (MULTIVITAMIN WITH MINERALS) tablet Take 1 tablet by mouth daily.    [provider]  telmisartan-hydrochlorothiazide (MICARDIS HCT) 80-12.5 MG tablet TAKE 1 TABLET BY MOUTH ONCE DAILY 05/16/20 05/16/21  Lavone Orn, MD  telmisartan-hydrochlorothiazide (MICARDIS HCT) 80-12.5 MG tablet Take 1 tablet by mouth once daily. 11/28/20     telmisartan-hydrochlorothiazide (MICARDIS HCT) 80-25 MG tablet Take 1 tablet by mouth daily.    [provider]    Family History Family History  Problem Relation Age of Onset   Cancer Mother    Diabetes Father     Social History Social History   Tobacco Use   Smoking status: Former    Years: 2.00    Pack years: 0.00    Types: Cigarettes    Quit date: 08/21/1959    Years since quitting: 61.5   Smokeless tobacco: Never  Vaping Use   Vaping Use: Never used  Substance Use Topics   Alcohol use: No   Drug use: No     Allergies   Patient has no known allergies.   Review of Systems Review of Systems Per HPI  Physical Exam Triage Vital Signs ED Triage Vitals  Enc Vitals Group     BP 02/14/21 1724 (!) 159/71     Pulse Rate 02/14/21 1724 98     Resp 02/14/21 1724 18     Temp 02/14/21 1724 98.5 F (36.9 C)     Temp Source 02/14/21 1724 Oral     SpO2 02/14/21 1724 100 %     Weight --      Height --      Head Circumference --      Peak Flow --      Pain Score 02/14/21 1723 8     Pain Loc --      Pain Edu? --      Excl. in [place]? --    No data found.  Updated Vital Signs BP (!) 159/71 (BP Location: Left Arm)   Pulse 98   Temp 98.5 F (36.9 C) (Oral)   Resp 18    SpO2 100%   Visual Acuity -declined As she has not noticed any vision changes Right Eye Distance:   Left Eye Distance:   Bilateral Distance:    Right Eye Near:   Left Eye Near:    Bilateral Near:     Physical Exam Vitals and nursing note reviewed.  Constitutional:  Appearance: Normal appearance. She is not ill-appearing.  HENT:     Head: Atraumatic.  Eyes:     Extraocular Movements: Extraocular movements intact.     Conjunctiva/sclera: Conjunctivae normal.     Pupils: Pupils are equal, round, and reactive to light.     Comments: Stye present left upper lash line centrally, no active drainage  Cardiovascular:     Rate and Rhythm: Normal rate and regular rhythm.     Heart sounds: Normal heart sounds.  Pulmonary:     Effort: Pulmonary effort is normal.     Breath sounds: Normal breath sounds.  Musculoskeletal:        General: Normal range of motion.     Cervical back: Normal range of motion and neck supple.  Skin:    General: Skin is warm and dry.  Neurological:     Mental Status: She is alert and oriented to person, place, and time.  Psychiatric:        Mood and Affect: Mood normal.        Thought Content: Thought content normal.        Judgment: Judgment normal.     UC Treatments / Results  Labs (all labs ordered are listed, but only abnormal results are displayed) Labs Reviewed - No data to display  EKG   Radiology No results found.  Procedures Procedures (including critical care time)  Medications Ordered in UC Medications - No data to display  Initial Impression / Assessment and Plan / UC Course  I have reviewed the triage vital signs and the nursing notes.  Pertinent labs & imaging results that were available during my care of the patient were reviewed by me and considered in my medical decision making (see chart for details).     Will treat with erythromycin ointment, warm compresses, over-the-counter pain relievers as needed.  Follow-up with  eye doctor if worsening or not resolving.  Final Clinical Impressions(s) / UC Diagnoses   Final diagnoses:  Hordeolum externum of left upper eyelid   Discharge Instructions   None    ED Prescriptions     Medication Sig Dispense Auth. Provider   erythromycin ophthalmic ointment Place a 1/2 inch ribbon of ointment into the left lower eyelid twice daily. 3.5 g Volney American, [place]      PDMP not reviewed this encounter.   Volney American, [place] 02/14/21 [id number]

## 2021-02-15 ENCOUNTER — Other Ambulatory Visit (HOSPITAL_COMMUNITY): Payer: Self-pay

## 2021-02-17 ENCOUNTER — Other Ambulatory Visit (HOSPITAL_COMMUNITY): Payer: Self-pay

## 2021-02-23 ENCOUNTER — Other Ambulatory Visit (HOSPITAL_COMMUNITY): Payer: Self-pay

## 2021-02-28 ENCOUNTER — Other Ambulatory Visit (HOSPITAL_COMMUNITY): Payer: Self-pay

## 2021-03-03 ENCOUNTER — Other Ambulatory Visit (HOSPITAL_COMMUNITY): Payer: Self-pay

## 2021-03-06 ENCOUNTER — Other Ambulatory Visit (HOSPITAL_COMMUNITY): Payer: Self-pay

## 2021-03-06 MED ORDER — ACCU-CHEK AVIVA PLUS VI STRP
ORAL_STRIP | 3 refills | Status: DC
  Filled 2021-03-06: qty 100, 90d supply, fill #0

## 2021-03-06 MED ORDER — FREESTYLE LANCETS MISC
3 refills | Status: DC
  Filled 2021-03-06: qty 100, 90d supply, fill #0

## 2021-03-06 MED ORDER — GLUCOSE BLOOD VI STRP
ORAL_STRIP | 3 refills | Status: DC
  Filled 2021-03-06: qty 100, 90d supply, fill #0

## 2021-03-06 MED ORDER — FREESTYLE LITE W/DEVICE KIT
PACK | 0 refills | Status: DC
  Filled 2021-03-06: qty 1, 30d supply, fill #0

## 2021-03-07 ENCOUNTER — Other Ambulatory Visit (HOSPITAL_COMMUNITY): Payer: Self-pay

## 2021-03-07 MED ORDER — AMLODIPINE BESYLATE 10 MG PO TABS
ORAL_TABLET | ORAL | 3 refills | Status: DC
  Filled 2021-03-07: qty 90, 90d supply, fill #0
  Filled 2021-05-31: qty 90, 90d supply, fill #1
  Filled 2021-09-13: qty 90, 90d supply, fill #2
  Filled 2021-12-13: qty 90, 90d supply, fill #3

## 2021-03-08 ENCOUNTER — Other Ambulatory Visit (HOSPITAL_COMMUNITY): Payer: Self-pay

## 2021-03-15 ENCOUNTER — Other Ambulatory Visit (HOSPITAL_COMMUNITY): Payer: Self-pay

## 2021-04-17 DIAGNOSIS — D509 Iron deficiency anemia, unspecified: Secondary | ICD-10-CM | POA: Diagnosis not present

## 2021-04-17 DIAGNOSIS — I700 Atherosclerosis of aorta: Secondary | ICD-10-CM | POA: Diagnosis not present

## 2021-04-17 DIAGNOSIS — I10 Essential (primary) hypertension: Secondary | ICD-10-CM | POA: Diagnosis not present

## 2021-04-17 DIAGNOSIS — E1169 Type 2 diabetes mellitus with other specified complication: Secondary | ICD-10-CM | POA: Diagnosis not present

## 2021-04-17 DIAGNOSIS — Z0000 Encounter for general adult medical examination without abnormal findings: Secondary | ICD-10-CM | POA: Diagnosis not present

## 2021-04-17 DIAGNOSIS — I872 Venous insufficiency (chronic) (peripheral): Secondary | ICD-10-CM | POA: Diagnosis not present

## 2021-04-17 DIAGNOSIS — I712 Thoracic aortic aneurysm, without rupture: Secondary | ICD-10-CM | POA: Diagnosis not present

## 2021-04-17 DIAGNOSIS — Z23 Encounter for immunization: Secondary | ICD-10-CM | POA: Diagnosis not present

## 2021-04-17 DIAGNOSIS — E7800 Pure hypercholesterolemia, unspecified: Secondary | ICD-10-CM | POA: Diagnosis not present

## 2021-04-21 ENCOUNTER — Other Ambulatory Visit: Payer: Self-pay | Admitting: Internal Medicine

## 2021-04-21 DIAGNOSIS — I7121 Aneurysm of the ascending aorta, without rupture: Secondary | ICD-10-CM

## 2021-04-21 DIAGNOSIS — I712 Thoracic aortic aneurysm, without rupture: Secondary | ICD-10-CM

## 2021-05-08 DIAGNOSIS — R030 Elevated blood-pressure reading, without diagnosis of hypertension: Secondary | ICD-10-CM | POA: Diagnosis not present

## 2021-05-08 DIAGNOSIS — I499 Cardiac arrhythmia, unspecified: Secondary | ICD-10-CM | POA: Diagnosis not present

## 2021-05-10 ENCOUNTER — Other Ambulatory Visit: Payer: Self-pay

## 2021-05-10 ENCOUNTER — Other Ambulatory Visit (HOSPITAL_COMMUNITY): Payer: Self-pay

## 2021-05-10 ENCOUNTER — Ambulatory Visit
Admission: RE | Admit: 2021-05-10 | Discharge: 2021-05-10 | Disposition: A | Discharge status: Home / Self Care | Payer: 59 | Source: Ambulatory Visit | Attending: Internal Medicine | Admitting: Internal Medicine

## 2021-05-10 DIAGNOSIS — I7121 Aneurysm of the ascending aorta, without rupture: Secondary | ICD-10-CM

## 2021-05-10 DIAGNOSIS — I712 Thoracic aortic aneurysm, without rupture: Secondary | ICD-10-CM | POA: Diagnosis not present

## 2021-05-10 MED ORDER — GADOBENATE DIMEGLUMINE 529 MG/ML IV SOLN
12.0000 mL | Freq: Once | INTRAVENOUS | Status: AC | PRN
  Administered 2021-05-10: 12 mL via INTRAVENOUS

## 2021-05-31 ENCOUNTER — Other Ambulatory Visit (HOSPITAL_COMMUNITY): Payer: Self-pay

## 2021-06-01 ENCOUNTER — Other Ambulatory Visit (HOSPITAL_COMMUNITY): Payer: Self-pay

## 2021-06-01 MED ORDER — METFORMIN HCL 500 MG PO TABS
ORAL_TABLET | ORAL | 3 refills | Status: DC
  Filled 2021-06-01: qty 360, 90d supply, fill #0
  Filled 2021-10-11: qty 360, 90d supply, fill #1
  Filled 2022-04-04: qty 360, 90d supply, fill #2

## 2021-06-12 ENCOUNTER — Other Ambulatory Visit (HOSPITAL_COMMUNITY): Payer: Self-pay

## 2021-06-12 DIAGNOSIS — L309 Dermatitis, unspecified: Secondary | ICD-10-CM | POA: Diagnosis not present

## 2021-06-12 DIAGNOSIS — Z01419 Encounter for gynecological examination (general) (routine) without abnormal findings: Secondary | ICD-10-CM | POA: Diagnosis not present

## 2021-06-12 DIAGNOSIS — N951 Menopausal and female climacteric states: Secondary | ICD-10-CM | POA: Diagnosis not present

## 2021-06-12 MED ORDER — TRIAMCINOLONE ACETONIDE 0.1 % EX CREA
TOPICAL_CREAM | CUTANEOUS | 1 refills | Status: DC
  Filled 2021-06-12: qty 30, 30d supply, fill #0

## 2021-06-18 ENCOUNTER — Encounter (HOSPITAL_COMMUNITY): Payer: Self-pay | Admitting: Emergency Medicine

## 2021-06-18 ENCOUNTER — Other Ambulatory Visit: Payer: Self-pay

## 2021-06-18 ENCOUNTER — Emergency Department (HOSPITAL_COMMUNITY)
Admission: EM | Admit: 2021-06-18 | Discharge: 2021-06-18 | Disposition: A | Discharge status: Home / Self Care | Payer: 59 | Attending: Emergency Medicine | Admitting: Emergency Medicine

## 2021-06-18 DIAGNOSIS — I10 Essential (primary) hypertension: Secondary | ICD-10-CM | POA: Diagnosis not present

## 2021-06-18 DIAGNOSIS — H1132 Conjunctival hemorrhage, left eye: Secondary | ICD-10-CM | POA: Insufficient documentation

## 2021-06-18 DIAGNOSIS — Z79899 Other long term (current) drug therapy: Secondary | ICD-10-CM | POA: Diagnosis not present

## 2021-06-18 DIAGNOSIS — Z7984 Long term (current) use of oral hypoglycemic drugs: Secondary | ICD-10-CM | POA: Diagnosis not present

## 2021-06-18 DIAGNOSIS — Z96653 Presence of artificial knee joint, bilateral: Secondary | ICD-10-CM | POA: Insufficient documentation

## 2021-06-18 DIAGNOSIS — E119 Type 2 diabetes mellitus without complications: Secondary | ICD-10-CM | POA: Insufficient documentation

## 2021-06-18 DIAGNOSIS — H00014 Hordeolum externum left upper eyelid: Secondary | ICD-10-CM | POA: Diagnosis not present

## 2021-06-18 DIAGNOSIS — Z87891 Personal history of nicotine dependence: Secondary | ICD-10-CM | POA: Diagnosis not present

## 2021-06-18 NOTE — ED Triage Notes (Signed)
Pt works in [employer] and co-worker noticed she had blood in her L eye this morning.  Denies pain and vision changes.

## 2021-06-18 NOTE — ED Notes (Signed)
Discharged by PA at triage

## 2021-06-18 NOTE — ED Provider Notes (Signed)
[facility] EMERGENCY DEPARTMENT Provider Note   CSN: [id number] Arrival date & time: 06/18/21  3825     History No chief complaint on file.   Savannah Lewis is a 80 y.o. female with no significant past medical history who presents with spontaneous new subconjunctival hemorrhage in the left eye.  Patient denies any trauma to the affected eye, denies cough, sneeze, or other inciting incident.  Patient denies vision changes, tightness of the eye, headache, blurry vision, photophobia.  Patient does not take a blood thinner.  Normal eye last night.  No pain, no treatment at this time.  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)   History of Bell's palsy    2003  RIGHT SIDE-  resolved   Hypertension    Thoracic aortic aneurysm    followed by Dr Tharon Aquas Trigt- LOV- 02/2013 in [facility]    Thoracic ascending aortic aneurysm    4.1 CM  STABLE SINCE 2004 PER DR VANTRIGHT NOTE 02/2013   TMJ (temporomandibular joint syndrome)    Type 2 diabetes mellitus ([place])    Wears glasses     Patient Active Problem List   Diagnosis Date Noted   H/O total shoulder replacement, 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   S/P knee replacement 08/31/2014   Thoracic ascending aortic aneurysm    Hypertension    Arthritis    Diabetes ([place])    Diabetes mellitus ([place]) 09/06/2012   HTN (hypertension) 09/06/2012    Past Surgical History:  Procedure Laterality Date   ABDOMINAL HYSTERECTOMY  1995   W/ UNILATERAL SALPINGOOPHORECTOMY   COLONOSCOPY N/A 04/14/2013    Procedure: COLONOSCOPY;  Surgeon: Garlan Fair, MD;  [facility];  Service: Endoscopy;  Laterality: N/A;   DORSAL COMPARTMENT RELEASE Left 09/24/2013   Procedure: LEFT WRIST FIRST DORSAL COMPARTMENT TENOSYNOVITIS;  Surgeon: Linna [place], MD;  Location: [place];  Service: Orthopedics;  Laterality: Left;  ANESTHESIA: LOCAL/IV SEDATION   JOINT REPLACEMENT     KNEE ARTHROSCOPY Bilateral RIGHT 2002 & 1994/   LEFT  1995   LAPAROSCOPY ASSISTED  RIGHT COLECTOMY  03-22-2005   CECUM POLYP   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/2018   Procedure: REVERSE LEFT SHOULDER ARTHROPLASTY;  Surgeon: Netta Cedars, MD;  Location: [place];  Service: Orthopedics;  Laterality: Left;   ROTATOR CUFF REPAIR Right 08-31-2008   TOTAL KNEE ARTHROPLASTY Right 07-01-2009   TOTAL KNEE ARTHROPLASTY Left 08/31/2014   Procedure: LEFT TOTAL KNEE ARTHROPLASTY;  Surgeon: Sydnee Cabal, MD;  [facility];  Service: Orthopedics;  Laterality: Left;     OB History   No obstetric history on file.     Family History  Problem Relation Age of Onset   Cancer Mother    Diabetes Father     Social History   Tobacco Use   Smoking status: Former    Years: 2.00    Types: Cigarettes    Quit date: 08/21/1959  Years since quitting: 61.8   Smokeless tobacco: Never  Vaping Use   Vaping Use: Never used  Substance Use Topics   Alcohol use: No   Drug use: No    Home Medications Prior to Admission medications   Medication Sig Start Date End Date Taking? Authorizing Provider  amLODipine (NORVASC) 10 MG tablet Take 10 mg by mouth every morning.     [provider]  amLODipine (NORVASC) 10 MG tablet TAKE 1 TABLET BY MOUTH ONCE DAILY 03/17/20 03/17/21  Lavone Orn, MD  amLODipine (NORVASC) 10 MG tablet TAKE 1 TABLET BY MOUTH ONCE A DAY 03/07/21     amoxicillin-clavulanate (AUGMENTIN) 875-125 MG tablet TAKE 1 TABLET BY MOUTH EVERY 12 HOURS FOR 10 DAYS 07/07/20 07/07/21  Laurence Aly, OD   Blood Glucose Monitoring Suppl (FREESTYLE LITE) w/Device KIT Use as directed to test blood sugar once a day and as needed. 03/06/21     calcium carbonate (OSCAL) 1500 (600 Ca) MG TABS tablet Take 600 mg of elemental calcium by mouth daily with breakfast.     [provider]  doxycycline (VIBRA-TABS) 100 MG tablet TAKE 1 TABLET BY MOUTH EVERY 12 HOURS FOR 7 DAYS 06/29/20 06/29/21  Laurence Aly, OD  erythromycin ophthalmic ointment Place a 1/2 inch ribbon of ointment into the left lower eyelid twice daily. 02/14/21   Volney American, PA-C  furosemide (LASIX) 40 MG tablet Take 1 tablet by mouth once daily for 3 days 12/07/20     glucose blood (ACCU-CHEK AVIVA PLUS) test strip USE TO TEST BLOOD SUGAR ONCE A DAY AND AS NEEDED 90 days 03/06/21     glucose blood test strip Use as directed to test blood sugar once a day and as needed. 03/06/21     hydroxypropyl methylcellulose / hypromellose (ISOPTO TEARS / GONIOVISC) 2.5 % ophthalmic solution Place 1 drop into both eyes daily.    [provider]  Lancets (FREESTYLE) lancets Use as directed to test blood sugar once a day and as needed. 03/06/21     metFORMIN (GLUCOPHAGE) 1000 MG tablet Take 1,000 mg by mouth 2 (two) times daily with a meal.    [provider]  metFORMIN (GLUCOPHAGE) 500 MG tablet Take 2 tablets by mouth twice daily 12/07/20     metFORMIN (GLUCOPHAGE) 500 MG tablet Take 2 tablets by mouth twice a day. 06/01/21     Multiple Vitamins-Minerals (MULTIVITAMIN WITH MINERALS) tablet Take 1 tablet by mouth daily.    [provider]  telmisartan-hydrochlorothiazide (MICARDIS HCT) 80-12.5 MG tablet TAKE 1 TABLET BY MOUTH ONCE DAILY 05/16/20 05/16/21  Lavone Orn, MD  telmisartan-hydrochlorothiazide (MICARDIS HCT) 80-12.5 MG tablet Take 1 tablet by mouth once daily. 11/28/20     telmisartan-hydrochlorothiazide (MICARDIS HCT) 80-25 MG tablet Take 1 tablet by mouth daily.    [provider]  triamcinolone cream  (KENALOG) 0.1 % Apply 1 application to affected area externally up to twice daily as needed. 06/12/21       Allergies    Patient has no known allergies.  Review of Systems   Review of Systems  Eyes:        Eye hemorrhage  All other systems reviewed and are negative.  Physical Exam Updated Vital Signs BP (!) 166/64 (BP Location: Right Arm)   Pulse 89   Temp 97.9 F (36.6 C)   Resp 16   SpO2 100%   Physical Exam Vitals and nursing note reviewed.  Constitutional:      General: She is not  in acute distress.    Appearance: Normal appearance.  HENT:     Head: Normocephalic and atraumatic.  Eyes:     General:        Right eye: No discharge.        Left eye: No discharge.     Extraocular Movements: Extraocular movements intact.     Pupils: Pupils are equal, round, and reactive to light.     Comments: Subconjunctival hemorrhage in the medial aspect of the left eye.  Does not cross into iris or pupil.  Pupils are equal round reactive to light bilaterally.  Intact extraocular movements.  No haziness.  There is some corneal arcus bilaterally.  Cardiovascular:     Rate and Rhythm: Normal rate and regular rhythm.  Pulmonary:     Effort: Pulmonary effort is normal. No respiratory distress.  Musculoskeletal:        General: No deformity.  Skin:    General: Skin is warm and dry.  Neurological:     Mental Status: She is alert and oriented to person, place, and time.  Psychiatric:        Mood and Affect: Mood normal.        Behavior: Behavior normal.    ED Results / Procedures / Treatments   Labs (all labs ordered are listed, but only abnormal results are displayed) Labs Reviewed - No data to display  EKG None  Radiology No results found.  Procedures Procedures   Medications Ordered in ED Medications - No data to display  ED Course  I have reviewed the triage vital signs and the nursing notes.  Pertinent labs & imaging results that were available during my care of  the patient were reviewed by me and considered in my medical decision making (see chart for details).    MDM Rules/Calculators/A&P                         Patient with appearance of subconjunctival hemorrhage with no evidence of hyphema or trauma to the eye.  Patient denies any pain or other changes.  Favor spontaneous subconjunctival hemorrhage secondary to Valsalva.  Patient has a appointment with her ophthalmologist tomorrow, encouraged her to follow-up with them.  Discussed this should resolve on its own after a couple of weeks, should not affect her vision.  Encouraged further evaluation if any new pain.  Patient understands and agrees to plan, discharged in stable condition. Final Clinical Impression(s) / ED Diagnoses Final diagnoses:  Subconjunctival hemorrhage of left eye    Rx / DC Orders ED Discharge Orders     None        Anselmo Pickler, PA-C 06/18/21 1224    Tegeler, Gwenyth Allegra, MD 06/18/21 1401

## 2021-06-18 NOTE — Discharge Instructions (Addendum)
As we discussed, this is most often a benign condition similar to a bruise. As long as you don't begin developing excessive bruising / bleeding elsewhere on the body, it should resolve spontaneously once the body absorbs the blood. Please follow up with your eye doctor as planned. Please follow up for further evaluation if you begin to develop new eye pain, difficulty seeing.

## 2021-06-21 DIAGNOSIS — I10 Essential (primary) hypertension: Secondary | ICD-10-CM | POA: Diagnosis not present

## 2021-06-21 DIAGNOSIS — M65342 Trigger finger, left ring finger: Secondary | ICD-10-CM | POA: Diagnosis not present

## 2021-06-21 DIAGNOSIS — H1132 Conjunctival hemorrhage, left eye: Secondary | ICD-10-CM | POA: Diagnosis not present

## 2021-07-07 DIAGNOSIS — D649 Anemia, unspecified: Secondary | ICD-10-CM | POA: Diagnosis not present

## 2021-07-13 ENCOUNTER — Emergency Department (HOSPITAL_COMMUNITY): Payer: 59

## 2021-07-13 ENCOUNTER — Emergency Department (HOSPITAL_COMMUNITY)
Admission: EM | Admit: 2021-07-13 | Discharge: 2021-07-13 | Disposition: A | Discharge status: Home / Self Care | Payer: 59 | Attending: Emergency Medicine | Admitting: Emergency Medicine

## 2021-07-13 DIAGNOSIS — M545 Low back pain, unspecified: Secondary | ICD-10-CM | POA: Insufficient documentation

## 2021-07-13 DIAGNOSIS — M546 Pain in thoracic spine: Secondary | ICD-10-CM | POA: Insufficient documentation

## 2021-07-13 DIAGNOSIS — I10 Essential (primary) hypertension: Secondary | ICD-10-CM | POA: Diagnosis not present

## 2021-07-13 DIAGNOSIS — Z96653 Presence of artificial knee joint, bilateral: Secondary | ICD-10-CM | POA: Diagnosis not present

## 2021-07-13 DIAGNOSIS — Z87891 Personal history of nicotine dependence: Secondary | ICD-10-CM | POA: Insufficient documentation

## 2021-07-13 DIAGNOSIS — E119 Type 2 diabetes mellitus without complications: Secondary | ICD-10-CM | POA: Insufficient documentation

## 2021-07-13 DIAGNOSIS — Z7984 Long term (current) use of oral hypoglycemic drugs: Secondary | ICD-10-CM | POA: Insufficient documentation

## 2021-07-13 DIAGNOSIS — M5489 Other dorsalgia: Secondary | ICD-10-CM

## 2021-07-13 DIAGNOSIS — I7121 Aneurysm of the ascending aorta, without rupture: Secondary | ICD-10-CM | POA: Diagnosis not present

## 2021-07-13 DIAGNOSIS — Z79899 Other long term (current) drug therapy: Secondary | ICD-10-CM | POA: Insufficient documentation

## 2021-07-13 DIAGNOSIS — R079 Chest pain, unspecified: Secondary | ICD-10-CM | POA: Diagnosis not present

## 2021-07-13 DIAGNOSIS — I712 Thoracic aortic aneurysm, without rupture, unspecified: Secondary | ICD-10-CM | POA: Diagnosis not present

## 2021-07-13 LAB — CBC WITH DIFFERENTIAL/PLATELET
Abs Immature Granulocytes: 0.01 10*3/uL (ref 0.00–0.07)
Basophils Absolute: 0 10*3/uL (ref 0.0–0.1)
Basophils Relative: 0 %
Eosinophils Absolute: 0.1 10*3/uL (ref 0.0–0.5)
Eosinophils Relative: 2 %
HCT: 35 % — ABNORMAL LOW (ref 36.0–46.0)
Hemoglobin: 11.1 g/dL — ABNORMAL LOW (ref 12.0–15.0)
Immature Granulocytes: 0 %
Lymphocytes Relative: 32 %
Lymphs Abs: 1 10*3/uL (ref 0.7–4.0)
MCH: 26.4 pg (ref 26.0–34.0)
MCHC: 31.7 g/dL (ref 30.0–36.0)
MCV: 83.1 fL (ref 80.0–100.0)
Monocytes Absolute: 0.4 10*3/uL (ref 0.1–1.0)
Monocytes Relative: 11 %
Neutro Abs: 1.7 10*3/uL (ref 1.7–7.7)
Neutrophils Relative %: 55 %
Platelets: 197 10*3/uL (ref 150–400)
RBC: 4.21 MIL/uL (ref 3.87–5.11)
RDW: 15.1 % (ref 11.5–15.5)
WBC: 3.1 10*3/uL — ABNORMAL LOW (ref 4.0–10.5)
nRBC: 0 % (ref 0.0–0.2)

## 2021-07-13 LAB — BASIC METABOLIC PANEL
Anion gap: 12 (ref 5–15)
BUN: 15 mg/dL (ref 8–23)
CO2: 24 mmol/L (ref 22–32)
Calcium: 9.3 mg/dL (ref 8.9–10.3)
Chloride: 103 mmol/L (ref 98–111)
Creatinine, Ser: 0.89 mg/dL (ref 0.44–1.00)
GFR, Estimated: >60 mL/min (ref >60)
Glucose, Bld: 150 mg/dL — ABNORMAL HIGH (ref 70–99)
Potassium: 3.7 mmol/L (ref 3.5–5.1)
Sodium: 139 mmol/L (ref 135–145)

## 2021-07-13 MED ORDER — MORPHINE SULFATE (PF) 4 MG/ML IV SOLN
4.0000 mg | Freq: Once | INTRAVENOUS | Status: AC
  Administered 2021-07-13: 4 mg via INTRAVENOUS
  Filled 2021-07-13: qty 1

## 2021-07-13 MED ORDER — IOHEXOL 350 MG/ML SOLN
75.0000 mL | Freq: Once | INTRAVENOUS | Status: AC | PRN
  Administered 2021-07-13: 75 mL via INTRAVENOUS

## 2021-07-13 MED ORDER — ONDANSETRON HCL 4 MG/2ML IJ SOLN
4.0000 mg | Freq: Once | INTRAMUSCULAR | Status: AC
  Administered 2021-07-13: 4 mg via INTRAVENOUS
  Filled 2021-07-13: qty 2

## 2021-07-13 NOTE — ED Triage Notes (Signed)
Pt here with reports of generalized back pain onset 2 days ago. Denies dysuria, incontinence. Pain is worse with movement.

## 2021-07-13 NOTE — ED Notes (Signed)
Pt transported to CT ?

## 2021-07-13 NOTE — Discharge Instructions (Addendum)
Take Tylenol as needed for your pain.  Your tests and imaging studies did not show any worrisome cause for your back pain.  Avoid lifting heavy objects for the next 2 to 3 days.  Follow-up with your doctor within the week.  Return immediately if you have worsening pain or any additional concerns.

## 2021-07-13 NOTE — ED Provider Notes (Signed)
[facility] EMERGENCY DEPARTMENT Provider Note   CSN: [id number] Arrival date & time: 07/13/21  1441     History Chief Complaint  Patient presents with   Back Pain    Savannah Lewis is a 80 y.o. female.  Presents to ER chief complaint of back pain describes it as upper and lower back aching pain onset was about 2 days ago.  In the instigating event or trauma or fall.  Describes as an aching pain up and down the mid region of her back nothing seems to make it better at home.  Denies any new numbness or weakness.  No chest pain.  No abdominal pain.  No fevers or cough or vomiting or diarrhea reported.      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)   History of Bell's palsy    2003  RIGHT SIDE-  resolved   Hypertension    Thoracic aortic aneurysm    followed by Dr Tharon Aquas Trigt- LOV- 02/2013 in [facility]    Thoracic ascending aortic aneurysm    4.1 CM  STABLE SINCE 2004 PER DR VANTRIGHT NOTE 02/2013   TMJ (temporomandibular joint syndrome)    Type 2 diabetes mellitus ([place])    Wears glasses     Patient Active Problem List   Diagnosis Date Noted   H/O total shoulder replacement, 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   S/P knee replacement 08/31/2014   Thoracic ascending aortic aneurysm    Hypertension    Arthritis    Diabetes ([place])    Diabetes mellitus ([place]) 09/06/2012   HTN (hypertension) 09/06/2012    Past Surgical History:  Procedure Laterality Date   ABDOMINAL HYSTERECTOMY  1995   W/ UNILATERAL SALPINGOOPHORECTOMY    COLONOSCOPY N/A 04/14/2013   Procedure: COLONOSCOPY;  Surgeon: Garlan Fair, MD;  Location: [facility];  Service: Endoscopy;  Laterality: N/A;   DORSAL COMPARTMENT RELEASE Left 09/24/2013   Procedure: LEFT WRIST FIRST DORSAL COMPARTMENT TENOSYNOVITIS;  Surgeon: Linna Hoff, MD;  Location: [facility];  Service: Orthopedics;  Laterality: Left;  ANESTHESIA: LOCAL/IV SEDATION   JOINT REPLACEMENT     KNEE ARTHROSCOPY Bilateral RIGHT 2002 & 1994/   LEFT  1995   LAPAROSCOPY ASSISTED  RIGHT COLECTOMY  03-22-2005   CECUM POLYP   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/2018   Procedure: REVERSE LEFT SHOULDER ARTHROPLASTY;  Surgeon: Netta Cedars, MD;  Location: [facility];  Service: Orthopedics;  Laterality: Left;   ROTATOR CUFF REPAIR Right 08-31-2008   TOTAL KNEE ARTHROPLASTY Right 07-01-2009   TOTAL KNEE ARTHROPLASTY Left 08/31/2014   Procedure: LEFT TOTAL KNEE ARTHROPLASTY;  Surgeon: Sydnee Cabal, MD;  Location: [facility];  Service: Orthopedics;  Laterality: Left;     OB History   No obstetric history on file.     Family History  Problem Relation Age of Onset   Cancer Mother    Diabetes Father     Social History   Tobacco Use   Smoking status: Former  Years: 2.00    Types: Cigarettes    Quit date: 08/21/1959    Years since quitting: 61.9   Smokeless tobacco: Never  Vaping Use   Vaping Use: Never used  Substance Use Topics   Alcohol use: No   Drug use: No    Home Medications Prior to Admission medications   Medication Sig Start Date End Date Taking? Authorizing Provider  amLODipine (NORVASC) 10 MG tablet Take 10 mg by mouth every morning.     [provider]  amLODipine (NORVASC) 10 MG tablet TAKE 1 TABLET BY MOUTH ONCE DAILY 03/17/20 03/17/21  Lavone Orn, MD  amLODipine (NORVASC) 10 MG tablet TAKE 1 TABLET BY MOUTH ONCE A DAY 03/07/21     Blood Glucose Monitoring Suppl (FREESTYLE LITE) w/Device KIT Use as directed to test blood sugar once a day and  as needed. 03/06/21     calcium carbonate (OSCAL) 1500 (600 Ca) MG TABS tablet Take 600 mg of elemental calcium by mouth daily with breakfast.     [provider]  erythromycin ophthalmic ointment Place a 1/2 inch ribbon of ointment into the left lower eyelid twice daily. 02/14/21   Volney American, PA-C  furosemide (LASIX) 40 MG tablet Take 1 tablet by mouth once daily for 3 days 12/07/20     glucose blood (ACCU-CHEK AVIVA PLUS) test strip USE TO TEST BLOOD SUGAR ONCE A DAY AND AS NEEDED 90 days 03/06/21     glucose blood test strip Use as directed to test blood sugar once a day and as needed. 03/06/21     hydroxypropyl methylcellulose / hypromellose (ISOPTO TEARS / GONIOVISC) 2.5 % ophthalmic solution Place 1 drop into both eyes daily.    [provider]  Lancets (FREESTYLE) lancets Use as directed to test blood sugar once a day and as needed. 03/06/21     metFORMIN (GLUCOPHAGE) 1000 MG tablet Take 1,000 mg by mouth 2 (two) times daily with a meal.    [provider]  metFORMIN (GLUCOPHAGE) 500 MG tablet Take 2 tablets by mouth twice daily 12/07/20     metFORMIN (GLUCOPHAGE) 500 MG tablet Take 2 tablets by mouth twice a day. 06/01/21     Multiple Vitamins-Minerals (MULTIVITAMIN WITH MINERALS) tablet Take 1 tablet by mouth daily.    [provider]  telmisartan-hydrochlorothiazide (MICARDIS HCT) 80-12.5 MG tablet TAKE 1 TABLET BY MOUTH ONCE DAILY 05/16/20 05/16/21  Lavone Orn, MD  telmisartan-hydrochlorothiazide (MICARDIS HCT) 80-12.5 MG tablet Take 1 tablet by mouth once daily. 11/28/20     telmisartan-hydrochlorothiazide (MICARDIS HCT) 80-25 MG tablet Take 1 tablet by mouth daily.    [provider]  triamcinolone cream (KENALOG) 0.1 % Apply 1 application to affected area externally up to twice daily as needed. 06/12/21       Allergies    Patient has no known allergies.  Review of Systems   Review of Systems  Constitutional:  Negative for fever.   HENT:  Negative for ear pain.   Eyes:  Negative for pain.  Respiratory:  Negative for cough.   Cardiovascular:  Negative for chest pain.  Gastrointestinal:  Negative for abdominal pain.  Genitourinary:  Negative for flank pain.  Musculoskeletal:  Positive for back pain.  Skin:  Negative for rash.  Neurological:  Negative for headaches.   Physical Exam Updated Vital Signs BP (!) 169/67 (BP Location: Right Arm)   Pulse 88   Resp 18   SpO2 99%   Physical Exam Constitutional:  General: She is not in acute distress.    Appearance: Normal appearance.  HENT:     Head: Normocephalic.     Nose: Nose normal.  Eyes:     Extraocular Movements: Extraocular movements intact.  Cardiovascular:     Rate and Rhythm: Normal rate.  Pulmonary:     Effort: Pulmonary effort is normal.  Musculoskeletal:        General: Normal range of motion.     Cervical back: Normal range of motion.     Comments: No significant C or T or L-spine midline step-offs or tenderness noted.  Neurological:     General: No focal deficit present.     Mental Status: She is alert and oriented to person, place, and time. Mental status is at baseline.     Cranial Nerves: No cranial nerve deficit.     Motor: No weakness.    ED Results / Procedures / Treatments   Labs (all labs ordered are listed, but only abnormal results are displayed) Labs Reviewed  CBC WITH DIFFERENTIAL/PLATELET - Abnormal; Notable for the following components:      Result Value   WBC 3.1 (*)    Hemoglobin 11.1 (*)    HCT 35.0 (*)    All other components within normal limits  BASIC METABOLIC PANEL - Abnormal; Notable for the following components:   Glucose, Bld 150 (*)    All other components within normal limits    EKG None  Radiology CT Angio Chest Aorta W and/or Wo Contrast  Result Date: 07/13/2021 CLINICAL DATA:  Follow-up thoracic aortic aneurysm. Back pain 2 days. EXAM: CT ANGIOGRAPHY CHEST WITH CONTRAST TECHNIQUE:  Multidetector CT imaging of the chest was performed using the standard protocol during bolus administration of intravenous contrast. Multiplanar CT image reconstructions and MIPs were obtained to evaluate the vascular anatomy. CONTRAST:  18m OMNIPAQUE IOHEXOL 350 MG/ML SOLN COMPARISON:  09/10/2018, 05/04/2005 FINDINGS: Cardiovascular: Heart is normal size. Mild aneurysm of the ascending thoracic aorta measuring 4 cm in AP diameter unchanged from 2006. No evidence of aortic dissection. Calcified plaque throughout the thoracic aorta. Aortic arch and descending thoracic aorta are normal in caliber. Pulmonary arterial system is normal. Remaining vascular structures are unremarkable. Mediastinum/Nodes: No evidence of mediastinal or hilar adenopathy. Remaining mediastinal structures are normal. Lungs/Pleura: Lungs are well inflated without focal airspace consolidation or effusion. 3-4 mm nodule over the right lower lobe less solid in appearance, but otherwise unchanged. No additional nodules. Airways are normal. Upper Abdomen: Calcified plaque over the abdominal aorta. No acute findings. Stable cystic renal changes. Musculoskeletal: Moderate degenerative change of the spine with slight exaggerated kyphosis of the thoracic spine. Review of the MIP images confirms the above findings. IMPRESSION: 1. No acute cardiopulmonary disease. 2. Stable 4 cm aneurysm of the ascending thoracic aorta unchanged from 2006. Recommend annual imaging followup by CTA or MRA. This recommendation follows 2010 ACCF/AHA/AATS/ACR/ASA/SCA/SCAI/SIR/STS/SVM Guidelines for the Diagnosis and Management of Patients with Thoracic Aortic Disease. Circulation. 2010; 121:: [id number] Aortic aneurysm NOS (ICD10-I71.9). 3. Aortic atherosclerosis. Stable cystic renal changes. Aortic Atherosclerosis (ICD10-I70.0). Electronically Signed   By: DMarin OlpM.D.   On: 07/13/2021 17:50    Procedures Procedures   Medications Ordered in ED Medications   morphine 4 MG/ML injection 4 mg (4 mg Intravenous Given 07/13/21 1600)  ondansetron (ZOFRAN) injection 4 mg (4 mg Intravenous Given 07/13/21 1558)  iohexol (OMNIPAQUE) 350 MG/ML injection 75 mL (75 mLs Intravenous Contrast Given 07/13/21 1738)    ED Course  I  have reviewed the triage vital signs and the nursing notes.  Pertinent labs & imaging results that were available during my care of the patient were reviewed by me and considered in my medical decision making (see chart for details).    MDM Rules/Calculators/A&P                           Patient has a history of known aortic aneurysm without any significant change for the last decade.  However given this history and this unprovoked back pain we will pursue repeat imaging.  Imaging is unremarkable for etiology of her pain.  Given negative work-up I suspect muscle skeletal pain.  She has no focal neurodeficit no fevers.  Advising outpatient follow-up with her doctor within the week, advising immediate return for worsening symptoms fevers pain or any additional concerns.  Final Clinical Impression(s) / ED Diagnoses Final diagnoses:  Midline back pain, unspecified back location, unspecified chronicity    Rx / DC Orders ED Discharge Orders     None        Luna Fuse, MD 07/13/21 [phone]

## 2021-07-28 DIAGNOSIS — M65342 Trigger finger, left ring finger: Secondary | ICD-10-CM | POA: Insufficient documentation

## 2021-08-03 DIAGNOSIS — S8002XA Contusion of left knee, initial encounter: Secondary | ICD-10-CM | POA: Diagnosis not present

## 2021-08-03 DIAGNOSIS — S8001XA Contusion of right knee, initial encounter: Secondary | ICD-10-CM | POA: Diagnosis not present

## 2021-09-13 ENCOUNTER — Other Ambulatory Visit (HOSPITAL_COMMUNITY): Payer: Self-pay

## 2021-09-13 DIAGNOSIS — Z1231 Encounter for screening mammogram for malignant neoplasm of breast: Secondary | ICD-10-CM | POA: Diagnosis not present

## 2021-09-14 ENCOUNTER — Other Ambulatory Visit (HOSPITAL_COMMUNITY): Payer: Self-pay

## 2021-09-18 ENCOUNTER — Other Ambulatory Visit (HOSPITAL_COMMUNITY): Payer: Self-pay

## 2021-09-27 ENCOUNTER — Ambulatory Visit
Admission: EM | Admit: 2021-09-27 | Discharge: 2021-09-27 | Disposition: A | Discharge status: Home / Self Care | Payer: 59 | Attending: Emergency Medicine | Admitting: Emergency Medicine

## 2021-09-27 ENCOUNTER — Other Ambulatory Visit (HOSPITAL_COMMUNITY): Payer: Self-pay

## 2021-09-27 ENCOUNTER — Other Ambulatory Visit: Payer: Self-pay

## 2021-09-27 DIAGNOSIS — J329 Chronic sinusitis, unspecified: Secondary | ICD-10-CM

## 2021-09-27 DIAGNOSIS — J310 Chronic rhinitis: Secondary | ICD-10-CM | POA: Diagnosis not present

## 2021-09-27 DIAGNOSIS — R0982 Postnasal drip: Secondary | ICD-10-CM | POA: Diagnosis not present

## 2021-09-27 DIAGNOSIS — R051 Acute cough: Secondary | ICD-10-CM

## 2021-09-27 MED ORDER — GUAIFENESIN 400 MG PO TABS
ORAL_TABLET | ORAL | 0 refills | Status: AC
  Filled 2021-09-27: qty 21, fill #0

## 2021-09-27 MED ORDER — IPRATROPIUM BROMIDE 0.06 % NA SOLN
2.0000 | Freq: Four times a day (QID) | NASAL | 0 refills | Status: AC
  Filled 2021-09-27: qty 15, 10d supply, fill #0

## 2021-09-27 MED ORDER — PROMETHAZINE-DM 6.25-15 MG/5ML PO SYRP
5.0000 mL | ORAL_SOLUTION | Freq: Four times a day (QID) | ORAL | 0 refills | Status: DC | PRN
  Filled 2021-09-27: qty 118, 6d supply, fill #0

## 2021-09-27 NOTE — ED Provider Notes (Signed)
[facility]    CSN: [id number] Arrival date & time: 09/27/21  4098    HISTORY   Chief Complaint  Patient presents with   Cough   HPI Savannah Lewis is a 81 y.o. female. Pt patient complains of cough and pain in her chest secondary to congestion and hurting when she is coughing as well as body aches that began 2 days ago, patient states overall she feels better today than she did yesterday.  Patient states cough is productive of clear mucus but only small amounts.  Patient denies history of asthma or allergies, adds that she does use Flonase nasal spray.  Patient has elevated blood pressure on arrival.  Vital signs are otherwise normal.  The history is provided by the patient.  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)   History of Bell's palsy    2003  RIGHT SIDE-  resolved   Hypertension    Thoracic aortic aneurysm    followed by Dr Tharon Aquas Trigt- LOV- 02/2013 in [facility]    Thoracic ascending aortic aneurysm    4.1 CM  STABLE SINCE 2004 PER DR VANTRIGHT NOTE 02/2013   TMJ (temporomandibular joint syndrome)    Type 2 diabetes mellitus ([place])    Wears glasses    Patient Active Problem List   Diagnosis Date Noted   H/O total shoulder replacement, 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   S/P knee replacement 08/31/2014   Thoracic ascending aortic aneurysm    Hypertension    Arthritis    Diabetes ([place])    Diabetes mellitus ([place]) 09/06/2012   HTN (hypertension) 09/06/2012   Past Surgical History:  Procedure Laterality Date   ABDOMINAL  HYSTERECTOMY  1995   W/ UNILATERAL SALPINGOOPHORECTOMY   COLONOSCOPY N/A 04/14/2013   Procedure: COLONOSCOPY;  Surgeon: Garlan Fair, MD;  [facility];  Service: Endoscopy;  Laterality: N/A;   DORSAL COMPARTMENT RELEASE Left 09/24/2013   Procedure: LEFT WRIST FIRST DORSAL COMPARTMENT TENOSYNOVITIS;  Surgeon: Linna Hoff, MD;  Location: [place];  Service: Orthopedics;  Laterality: Left;  ANESTHESIA: LOCAL/IV SEDATION   JOINT REPLACEMENT     KNEE ARTHROSCOPY Bilateral RIGHT 2002 & 1994/   LEFT  1995   LAPAROSCOPY ASSISTED  RIGHT COLECTOMY  03-22-2005   CECUM POLYP   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/2018   Procedure: REVERSE LEFT SHOULDER ARTHROPLASTY;  Surgeon: Netta Cedars, MD;  Location: [place];  Service: Orthopedics;  Laterality: Left;   ROTATOR CUFF REPAIR Right 08-31-2008   TOTAL KNEE ARTHROPLASTY Right 07-01-2009   TOTAL KNEE ARTHROPLASTY Left 08/31/2014   Procedure: LEFT TOTAL KNEE ARTHROPLASTY;  Surgeon: Sydnee Cabal, MD;  [facility];  Service: Orthopedics;  Laterality: Left;   OB History   No obstetric history on file.    Home Medications    Prior to Admission medications   Medication Sig Start Date End Date Taking? Authorizing Provider  amLODipine (NORVASC) 10 MG tablet Take 10 mg by mouth every morning.  [provider]  amLODipine (NORVASC) 10 MG tablet TAKE 1 TABLET BY MOUTH ONCE DAILY 03/17/20 03/17/21  Lavone Orn, MD  amLODipine (NORVASC) 10 MG tablet TAKE 1 TABLET BY MOUTH ONCE A DAY 03/07/21     Blood Glucose Monitoring Suppl (FREESTYLE LITE) w/Device KIT Use as directed to test blood sugar once a day and as needed. 03/06/21     calcium carbonate (OSCAL) 1500 (600 Ca) MG TABS tablet Take 600 mg of elemental calcium by mouth daily with breakfast.     [provider]  erythromycin ophthalmic ointment Place a 1/2 inch ribbon of ointment into the left lower eyelid twice daily. 02/14/21   Volney American, PA-C  furosemide (LASIX) 40 MG tablet Take 1 tablet by mouth once daily for 3 days 12/07/20     glucose blood (ACCU-CHEK AVIVA PLUS) test strip USE TO TEST BLOOD SUGAR ONCE A DAY AND AS NEEDED 90 days 03/06/21     glucose blood test strip Use as directed to test blood sugar once a day and as needed. 03/06/21     hydroxypropyl methylcellulose / hypromellose (ISOPTO TEARS / GONIOVISC) 2.5 % ophthalmic solution Place 1 drop into both eyes daily.    [provider]  Lancets (FREESTYLE) lancets Use as directed to test blood sugar once a day and as needed. 03/06/21     metFORMIN (GLUCOPHAGE) 1000 MG tablet Take 1,000 mg by mouth 2 (two) times daily with a meal.    [provider]  metFORMIN (GLUCOPHAGE) 500 MG tablet Take 2 tablets by mouth twice daily 12/07/20     metFORMIN (GLUCOPHAGE) 500 MG tablet Take 2 tablets by mouth twice a day. 06/01/21     Multiple Vitamins-Minerals (MULTIVITAMIN WITH MINERALS) tablet Take 1 tablet by mouth daily.    [provider]  telmisartan-hydrochlorothiazide (MICARDIS HCT) 80-12.5 MG tablet TAKE 1 TABLET BY MOUTH ONCE DAILY 05/16/20 05/16/21  Lavone Orn, MD  telmisartan-hydrochlorothiazide (MICARDIS HCT) 80-12.5 MG tablet Take 1 tablet by mouth once daily. 11/28/20     telmisartan-hydrochlorothiazide (MICARDIS HCT) 80-25 MG tablet Take 1 tablet by mouth daily.    [provider]  triamcinolone cream (KENALOG) 0.1 % Apply 1 application to affected area externally up to twice daily as needed. 06/12/21      Family History Family History  Problem Relation Age of Onset   Cancer Mother    Diabetes Father    Social History Social History   Tobacco Use   Smoking status: Former    Years: 2.00    Types: Cigarettes    Quit date: 08/21/1959    Years since quitting: 62.1   Smokeless tobacco: Never  Vaping Use   Vaping Use: Never used  Substance Use Topics   Alcohol use: No   Drug use: No   Allergies   Patient has no known  allergies.  Review of Systems Review of Systems Pertinent findings noted in history of present illness.   Physical Exam Triage Vital Signs ED Triage Vitals  Enc Vitals Group     BP 06/16/21 0827 (!) 147/82     Pulse Rate 06/16/21 0827 72     Resp 06/16/21 0827 18     Temp 06/16/21 0827 98.3 F (36.8 C)     Temp Source 06/16/21 0827 Oral     SpO2 06/16/21 0827 98 %     Weight --      Height --      Head Circumference --  Peak Flow --      Pain Score 06/16/21 0826 5     Pain Loc --      Pain Edu? --      Excl. in [place]? --   No data found.  Updated Vital Signs BP (!) 160/66 (BP Location: Left Arm)    Pulse 89    Temp 98.4 F (36.9 C) (Oral)    Resp 18    SpO2 97%   Physical Exam Vitals and nursing note reviewed.  Constitutional:      General: She is not in acute distress.    Appearance: Normal appearance. She is not ill-appearing.  HENT:     Head: Normocephalic and atraumatic.     Salivary Glands: Right salivary gland is not diffusely enlarged or tender. Left salivary gland is not diffusely enlarged or tender.     Right Ear: Ear canal and external ear normal. No drainage. A middle ear effusion is present. There is no impacted cerumen. Tympanic membrane is bulging. Tympanic membrane is not injected or erythematous.     Left Ear: Ear canal and external ear normal. No drainage. A middle ear effusion is present. There is no impacted cerumen. Tympanic membrane is bulging. Tympanic membrane is not injected or erythematous.     Ears:     Comments: Bilateral EACs normal, both TMs bulging with clear fluid    Nose: Rhinorrhea present. No nasal deformity, septal deviation, signs of injury, nasal tenderness, mucosal edema or congestion. Rhinorrhea is clear.     Right Nostril: Occlusion present. No foreign body, epistaxis or septal hematoma.     Left Nostril: Occlusion present. No foreign body, epistaxis or septal hematoma.     Right Turbinates: Enlarged, swollen and pale.     Left  Turbinates: Enlarged, swollen and pale.     Right Sinus: No maxillary sinus tenderness or frontal sinus tenderness.     Left Sinus: No maxillary sinus tenderness or frontal sinus tenderness.     Mouth/Throat:     Lips: Pink. No lesions.     Mouth: Mucous membranes are moist. No oral lesions.     Pharynx: Oropharynx is clear. Uvula midline. No posterior oropharyngeal erythema or uvula swelling.     Tonsils: No tonsillar exudate. 0 on the right. 0 on the left.     Comments: Postnasal drip Eyes:     General: Lids are normal.        Right eye: No discharge.        Left eye: No discharge.     Extraocular Movements: Extraocular movements intact.     Conjunctiva/sclera: Conjunctivae normal.     Right eye: Right conjunctiva is not injected.     Left eye: Left conjunctiva is not injected.  Neck:     Trachea: Trachea and phonation normal.  Cardiovascular:     Rate and Rhythm: Normal rate and regular rhythm.     Pulses: Normal pulses.     Heart sounds: Normal heart sounds. No murmur heard.   No friction rub. No gallop.  Pulmonary:     Effort: Pulmonary effort is normal. No accessory muscle usage, prolonged expiration or respiratory distress.     Breath sounds: Normal breath sounds. No stridor, decreased air movement or transmitted upper airway sounds. No decreased breath sounds, wheezing, rhonchi or rales.  Chest:     Chest wall: No tenderness.  Musculoskeletal:        General: Normal range of motion.     Cervical back: Normal  range of motion and neck supple. Normal range of motion.  Lymphadenopathy:     Cervical: No cervical adenopathy.  Skin:    General: Skin is warm and dry.     Findings: No erythema or rash.  Neurological:     General: No focal deficit present.     Mental Status: She is alert and oriented to person, place, and time.  Psychiatric:        Mood and Affect: Mood normal.        Behavior: Behavior normal.    Visual Acuity Right Eye Distance:   Left Eye Distance:    Bilateral Distance:    Right Eye Near:   Left Eye Near:    Bilateral Near:     UC Couse / Diagnostics / Procedures:    EKG  Radiology No results found.  Procedures Procedures (including critical care time)  UC Diagnoses / Final Clinical Impressions(s)   I have reviewed the triage vital signs and the nursing notes.  Pertinent labs & imaging results that were available during my care of the patient were reviewed by me and considered in my medical decision making (see chart for details).   Final diagnoses:  Rhinosinusitis  Acute cough  Postnasal drip   Allergic rhinitis postnasal drip triggering cough.  Patient provided with Atrovent nasal spray and Mucinex, Promethazine DM for cough at night.  Patient provided with a note to return to work in 2 days.  ED Prescriptions     Medication Sig Dispense Auth. Provider   ipratropium (ATROVENT) 0.06 % nasal spray Place 2 sprays into both nostrils 4 (four) times daily. As needed for nasal congestion, runny nose 15 mL Lynden Oxford Scales, PA-C   promethazine-dextromethorphan (PROMETHAZINE-DM) 6.25-15 MG/5ML syrup Take 5 mLs by mouth 4 (four) times daily as needed for cough. 118 mL Lynden Oxford Scales, PA-C   guaifenesin (HUMIBID E) 400 MG TABS tablet Take 1 tablet 3 times daily as needed for chest congestion and cough 21 tablet Lynden Oxford Scales, PA-C      PDMP not reviewed this encounter.  Pending results:  Labs Reviewed - No data to display  Medications Ordered in UC: Medications - No data to display  Disposition Upon Discharge:  Condition: stable for discharge home Home: take medications as prescribed; routine discharge instructions as discussed; follow up as advised.  Patient presented with an acute illness with associated systemic symptoms and significant discomfort requiring urgent management. In my opinion, this is a condition that a prudent lay person (someone who possesses an average knowledge of health and  medicine) may potentially expect to result in complications if not addressed urgently such as respiratory distress, impairment of bodily function or dysfunction of bodily organs.   Routine symptom specific, illness specific and/or disease specific instructions were discussed with the patient and/or caregiver at length.   As such, the patient has been evaluated and assessed, work-up was performed and treatment was provided in alignment with urgent care protocols and evidence based medicine.  Patient/parent/caregiver has been advised that the patient may require follow up for further testing and treatment if the symptoms continue in spite of treatment, as clinically indicated and appropriate.  If the patient was tested for COVID-19, Influenza and/or RSV, then the patient/parent/guardian was advised to isolate at home pending the results of his/her diagnostic coronavirus test and potentially longer if theyre positive. I have also advised pt that if his/her COVID-19 test returns positive, it's recommended to self-isolate for at least 10 days after  symptoms first appeared AND until fever-free for 24 hours without fever reducer AND other symptoms have improved or resolved. Discussed self-isolation recommendations as well as instructions for household member/close contacts as per the [facility] and [place] DHHS, and also gave patient the [place] packet with this information.  Patient/parent/caregiver has been advised to return to the [facility] or PCP in 3-5 days if no better; to PCP or the Emergency Department if new signs and symptoms develop, or if the current signs or symptoms continue to change or worsen for further workup, evaluation and treatment as clinically indicated and appropriate  The patient will follow up with their current PCP if and as advised. If the patient does not currently have a PCP we will assist them in obtaining one.   The patient may need specialty follow up if the symptoms continue, in spite of  conservative treatment and management, for further workup, evaluation, consultation and treatment as clinically indicated and appropriate.  Patient/parent/caregiver verbalized understanding and agreement of plan as discussed.  All questions were addressed during visit.  Please see discharge instructions below for further details of plan.  Discharge Instructions:   Discharge Instructions      Please continue your current medications including Flonase.  Please begin Atrovent nasal spray, 2 sprays in each nare up to 4 times daily as needed to dry out mucous membranes and eliminate postnasal drip.  Please continue Robitussin cough syrup, I have sent you a prescription to your pharmacy.  Please also use Promethazine DM cough syrup at nighttime to help dry mucous membranes and help you sleep.  Thank you for visiting urgent care today.  If you do not feel better within the next 3 to 5 days, please return for repeat evaluation.    This office note has been dictated using Museum/gallery curator.  Unfortunately, and despite my best efforts, this method of dictation can sometimes lead to occasional typographical or grammatical errors.  I apologize in advance if this occurs.     Lynden Oxford Scales, [place] 09/27/21 [phone]

## 2021-09-27 NOTE — Discharge Instructions (Addendum)
Please continue your current medications including Flonase.  Please begin Atrovent nasal spray, 2 sprays in each nare up to 4 times daily as needed to dry out mucous membranes and eliminate postnasal drip.  Please continue Robitussin cough syrup, I have sent you a prescription to your pharmacy.  Please also use Promethazine DM cough syrup at nighttime to help dry mucous membranes and help you sleep.  Thank you for visiting urgent care today.  If you do not feel better within the next 3 to 5 days, please return for repeat evaluation.

## 2021-09-27 NOTE — ED Triage Notes (Signed)
Pt reports Monday she began having a cough, chest pain (states because of congestion and hurts when she coughs) and body aches.

## 2021-10-11 ENCOUNTER — Other Ambulatory Visit (HOSPITAL_COMMUNITY): Payer: Self-pay

## 2021-10-18 DIAGNOSIS — E1169 Type 2 diabetes mellitus with other specified complication: Secondary | ICD-10-CM | POA: Diagnosis not present

## 2021-10-18 DIAGNOSIS — D509 Iron deficiency anemia, unspecified: Secondary | ICD-10-CM | POA: Diagnosis not present

## 2021-10-18 DIAGNOSIS — I10 Essential (primary) hypertension: Secondary | ICD-10-CM | POA: Diagnosis not present

## 2021-10-18 DIAGNOSIS — I700 Atherosclerosis of aorta: Secondary | ICD-10-CM | POA: Diagnosis not present

## 2021-10-19 DIAGNOSIS — E119 Type 2 diabetes mellitus without complications: Secondary | ICD-10-CM | POA: Diagnosis not present

## 2021-12-02 ENCOUNTER — Other Ambulatory Visit (HOSPITAL_COMMUNITY): Payer: Self-pay

## 2021-12-02 DIAGNOSIS — M79642 Pain in left hand: Secondary | ICD-10-CM | POA: Diagnosis not present

## 2021-12-02 DIAGNOSIS — M79641 Pain in right hand: Secondary | ICD-10-CM | POA: Insufficient documentation

## 2021-12-02 MED ORDER — PREDNISONE 5 MG (21) PO TBPK
ORAL_TABLET | ORAL | 0 refills | Status: DC
  Filled 2021-12-02: qty 21, 6d supply, fill #0

## 2021-12-04 ENCOUNTER — Other Ambulatory Visit (HOSPITAL_COMMUNITY): Payer: Self-pay

## 2021-12-13 ENCOUNTER — Other Ambulatory Visit (HOSPITAL_COMMUNITY): Payer: Self-pay

## 2021-12-13 MED ORDER — TELMISARTAN-HCTZ 80-12.5 MG PO TABS
1.0000 | ORAL_TABLET | Freq: Every day | ORAL | 4 refills | Status: DC
  Filled 2021-12-13: qty 90, 90d supply, fill #0
  Filled 2022-03-07: qty 90, 90d supply, fill #1
  Filled 2022-06-21: qty 90, 90d supply, fill #2
  Filled 2022-09-19: qty 30, 30d supply, fill #3
  Filled 2022-10-18: qty 30, 30d supply, fill #4
  Filled 2022-11-19: qty 30, 30d supply, fill #5

## 2021-12-14 ENCOUNTER — Other Ambulatory Visit (HOSPITAL_COMMUNITY): Payer: Self-pay

## 2021-12-14 DIAGNOSIS — M65342 Trigger finger, left ring finger: Secondary | ICD-10-CM | POA: Diagnosis not present

## 2021-12-15 ENCOUNTER — Other Ambulatory Visit (HOSPITAL_COMMUNITY): Payer: Self-pay

## 2022-01-31 DIAGNOSIS — H9312 Tinnitus, left ear: Secondary | ICD-10-CM | POA: Diagnosis not present

## 2022-03-07 ENCOUNTER — Other Ambulatory Visit (HOSPITAL_COMMUNITY): Payer: Self-pay

## 2022-03-07 MED ORDER — AMLODIPINE BESYLATE 10 MG PO TABS
ORAL_TABLET | ORAL | 0 refills | Status: DC
  Filled 2022-03-07: qty 90, 90d supply, fill #0

## 2022-03-08 ENCOUNTER — Other Ambulatory Visit (HOSPITAL_COMMUNITY): Payer: Self-pay

## 2022-03-12 ENCOUNTER — Ambulatory Visit (INDEPENDENT_AMBULATORY_CARE_PROVIDER_SITE_OTHER): Payer: 59 | Admitting: Podiatry

## 2022-03-12 ENCOUNTER — Ambulatory Visit (INDEPENDENT_AMBULATORY_CARE_PROVIDER_SITE_OTHER): Payer: 59

## 2022-03-12 DIAGNOSIS — M79671 Pain in right foot: Secondary | ICD-10-CM

## 2022-03-12 DIAGNOSIS — L600 Ingrowing nail: Secondary | ICD-10-CM | POA: Diagnosis not present

## 2022-03-12 DIAGNOSIS — L601 Onycholysis: Secondary | ICD-10-CM

## 2022-03-12 NOTE — Progress Notes (Signed)
Subjective:   Patient ID: Savannah Lewis, female   DOB: 81 y.o.   MRN: [id number]   HPI Chief Complaint  Patient presents with   Nail Problem    Right hallux ingrown toenail, started 2 mth ago, pt has had most of the nail removed due to fungus, rate 9 out of 10, pain sharp pain around the nail bed.  Diabetic a1c- 6.2 BG -80    Barley can wear a shoe and it bothers her all of the time. No swelling, redness or drainage. She was using a topical medication on the nail.   Gets ankle swelling. No pain associated with this. She  was on Lasix but she states he is not on it anymore.    Review of Systems  All other systems reviewed and are negative.  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)   History of Bell's palsy    2003  RIGHT SIDE-  resolved   Hypertension    Thoracic aortic aneurysm    followed by Dr Tharon Aquas Trigt- LOV- 02/2013 in [facility]    Thoracic ascending aortic aneurysm    4.1 CM  STABLE SINCE 2004 PER DR VANTRIGHT NOTE 02/2013   TMJ (temporomandibular joint syndrome)    Type 2 diabetes mellitus ([place])    Wears glasses     Past Surgical History:  Procedure Laterality Date   ABDOMINAL HYSTERECTOMY  1995   W/ UNILATERAL SALPINGOOPHORECTOMY   COLONOSCOPY N/A 04/14/2013   Procedure: COLONOSCOPY;  Surgeon: Garlan Fair, MD;  [facility];  Service: Endoscopy;  Laterality: N/A;   DORSAL COMPARTMENT RELEASE Left 09/24/2013   Procedure: LEFT WRIST FIRST DORSAL COMPARTMENT TENOSYNOVITIS;  Surgeon: Linna Hoff, MD;  Location: [place];  Service:  Orthopedics;  Laterality: Left;  ANESTHESIA: LOCAL/IV SEDATION   JOINT REPLACEMENT     KNEE ARTHROSCOPY Bilateral RIGHT 2002 & 1994/   LEFT  1995   LAPAROSCOPY ASSISTED  RIGHT COLECTOMY  03-22-2005   CECUM POLYP   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/2018   Procedure: REVERSE LEFT SHOULDER ARTHROPLASTY;  Surgeon: Netta Cedars, MD;  Location: [place];  Service: Orthopedics;  Laterality: Left;   ROTATOR CUFF REPAIR Right 08-31-2008   TOTAL KNEE ARTHROPLASTY Right 07-01-2009   TOTAL KNEE ARTHROPLASTY Left 08/31/2014   Procedure: LEFT TOTAL KNEE ARTHROPLASTY;  Surgeon: Sydnee Cabal, MD;  [facility];  Service: Orthopedics;  Laterality: Left;     Current Outpatient Medications:    ascorbic acid (VITAMIN C) 500 MG tablet, , Disp: , Rfl:    Cetirizine HCl (ZYRTEC ALLERGY) 10 MG CAPS, as needed., Disp: , Rfl:    Cholecalciferol (VITAMIN D3) 10 MCG (400 UNIT) tablet, as needed., Disp: , Rfl:    amLODipine (NORVASC) 10 MG tablet, Take 10 mg by mouth every morning. , Disp: , Rfl:    amLODipine (NORVASC) 10 MG tablet, TAKE 1 TABLET BY MOUTH ONCE DAILY, Disp: 90 tablet, Rfl: 3   amLODipine (NORVASC) 10 MG tablet, TAKE 1 TABLET BY MOUTH ONCE A  DAY, Disp: 90 tablet, Rfl: 0   amLODipine (NORVASC) 10 MG tablet, Take 1 tablet by mouth daily., Disp: , Rfl:    Blood Glucose Monitoring Suppl (FREESTYLE LITE) w/Device KIT, Use as directed to test blood sugar once a day and as needed., Disp: 1 kit, Rfl: 0   Calcium Carb-Cholecalciferol (CALCIUM 600 + D) 600-5 MG-MCG TABS, 1 tablet, Disp: , Rfl:    calcium carbonate (OSCAL) 1500 (600 Ca) MG TABS tablet, Take 600 mg of elemental calcium by mouth daily with breakfast. , Disp: , Rfl:    cephALEXin (KEFLEX) 500 MG capsule, cephalexin 500 mg capsule, Disp: , Rfl:    furosemide (LASIX) 40 MG tablet, Take 1 tablet by mouth once daily for 3 days, Disp: 3 tablet, Rfl: 0   glucose blood (ACCU-CHEK AVIVA PLUS) test strip, USE TO TEST BLOOD SUGAR ONCE A DAY AND AS  NEEDED 90 days, Disp: 100 strip, Rfl: 3   glucose blood test strip, Use as directed to test blood sugar once a day and as needed., Disp: 100 each, Rfl: 3   guaifenesin (HUMIBID E) 400 MG TABS tablet, Take 1 tablet by mouth 3 times daily as needed for chest congestion and cough, Disp: 21 tablet, Rfl: 0   hydroxypropyl methylcellulose / hypromellose (ISOPTO TEARS / GONIOVISC) 2.5 % ophthalmic solution, Place 1 drop into both eyes daily., Disp: , Rfl:    ipratropium (ATROVENT) 0.06 % nasal spray, Place 2 sprays into both nostrils 4 (four) times daily as needed for nasal congestion, runny nose, Disp: 15 mL, Rfl: 0   Lancets (FREESTYLE) lancets, Use as directed to test blood sugar once a day and as needed., Disp: 100 each, Rfl: 3   metFORMIN (GLUCOPHAGE) 1000 MG tablet, Take 1,000 mg by mouth 2 (two) times daily with a meal., Disp: , Rfl:    metFORMIN (GLUCOPHAGE) 500 MG tablet, Take 2 tablets by mouth twice daily, Disp: 180 tablet, Rfl: 3   metFORMIN (GLUCOPHAGE) 500 MG tablet, Take 2 tablets by mouth twice a day., Disp: 360 tablet, Rfl: 3   Multiple Vitamins-Minerals (MULTIVITAMIN WITH MINERALS) tablet, Take 1 tablet by mouth daily., Disp: , Rfl:    promethazine-dextromethorphan (PROMETHAZINE-DM) 6.25-15 MG/5ML syrup, Take 5 mLs by mouth 4  times daily as needed for cough., Disp: 118 mL, Rfl: 0   telmisartan-hydrochlorothiazide (MICARDIS HCT) 80-12.5 MG tablet, TAKE 1 TABLET BY MOUTH ONCE DAILY, Disp: 90 tablet, Rfl: 3   telmisartan-hydrochlorothiazide (MICARDIS HCT) 80-12.5 MG tablet, Take 1 tablet by mouth once daily., Disp: 90 tablet, Rfl: 4   telmisartan-hydrochlorothiazide (MICARDIS HCT) 80-25 MG tablet, Take 1 tablet by mouth daily., Disp: , Rfl:    triamcinolone cream (KENALOG) 0.1 %, Apply 1 application to affected area externally up to twice daily as needed., Disp: 30 g, Rfl: 1  No Known Allergies        Objective:  Physical Exam  General: AAO x3, NAD  Dermatological: Right hallux  toenail is hypertrophic, dystrophic with loose underlying nailbed and incurvated along the nail borders.  She does get tenderness to palpation but is no edema, erythema, drainage approximately signs of infection.  No open lesions.  Vascular: Dorsalis Pedis artery and Posterior Tibial artery pedal pulses are 2/4 bilateral with immedate capillary fill time.  There is no pain with calf compression, swelling, warmth, erythema.   Neruologic: Grossly intact via light touch bilateral.   Musculoskeletal: Tenderness of the right hallux toenail.  Muscular strength 5/5 in all groups tested bilateral.  Gait: Unassisted, Nonantalgic.  Assessment:   Onycholysis     Plan:  -Treatment options discussed including all alternatives, risks, and complications -Etiology of symptoms were discussed -We discussed total nail removal and she does want to proceed with this.  However she has to work for the next couple days and she wants to plan on doing this next week and schedule for this procedure.  We discussed the procedure as well as postoperative course.  We will reappoint her at her convenience. -We discussed temporary versus permanent nail removal and she wants to do a temporary nail removal however if the nail were to come back in the same way then proceed with a total nail avulsion with chemical matricectomy.    Trula Slade DPM

## 2022-03-13 DIAGNOSIS — R600 Localized edema: Secondary | ICD-10-CM | POA: Diagnosis not present

## 2022-03-13 DIAGNOSIS — M7989 Other specified soft tissue disorders: Secondary | ICD-10-CM | POA: Diagnosis not present

## 2022-03-13 DIAGNOSIS — I10 Essential (primary) hypertension: Secondary | ICD-10-CM | POA: Diagnosis not present

## 2022-03-20 DIAGNOSIS — I10 Essential (primary) hypertension: Secondary | ICD-10-CM | POA: Diagnosis not present

## 2022-03-20 DIAGNOSIS — R197 Diarrhea, unspecified: Secondary | ICD-10-CM | POA: Diagnosis not present

## 2022-03-20 DIAGNOSIS — M7989 Other specified soft tissue disorders: Secondary | ICD-10-CM | POA: Diagnosis not present

## 2022-03-29 ENCOUNTER — Ambulatory Visit (INDEPENDENT_AMBULATORY_CARE_PROVIDER_SITE_OTHER): Payer: 59 | Admitting: Podiatry

## 2022-03-29 DIAGNOSIS — L600 Ingrowing nail: Secondary | ICD-10-CM | POA: Diagnosis not present

## 2022-03-29 DIAGNOSIS — L601 Onycholysis: Secondary | ICD-10-CM

## 2022-03-29 NOTE — Patient Instructions (Signed)

## 2022-03-29 NOTE — Progress Notes (Signed)
Subjective: Chief Complaint  Patient presents with   Ingrown Toenail    Rm 11 Right great nail removal. Pt requested a darco shoe after total nail removal.    81 year old female presents with above complaints.  She had to have the right big toenail removed as it is causing pain as it is thick and discolored.  Denies any drainage or pus.  She recent had a pedicure which helps some but still causing discomfort.  Objective: AAO x3, NAD DP/PT pulses palpable bilaterally, CRT less than 3 seconds Right hallux toenail is hypertrophic, dystrophic with subungual debris there is incurvation of the nail borders.  There is tenderness palpation of the entire toenail.  There is no edema, erythema or signs of infection.  No open lesions otherwise.   No pain with calf compression, swelling, warmth, erythema  Assessment: Right hallux onychodystrophy  Plan: -All treatment options discussed with the patient including all alternatives, risks, complications.  -At this time, she wants to proceed with total nail removal without chemical matricectomy to the right hallux due to the thickening and pain to the nail.  Discussed with this likely reoccurrence.  If the nail come back in the same way then she will remove it permanently.  Risks and complications were discussed with the patient for which they understand and  verbally consent to the procedure. Under sterile conditions a total of 3 mL of a mixture of 2% lidocaine plain and 0.5% Marcaine plain was infiltrated in a hallux block fashion. Once anesthetized, the skin was prepped in sterile fashion. A tourniquet was then applied.  The right hallux toenail was removed in total.  Once the nail was removed, the area was debrided and the underlying skin was intact. The area was irrigated and hemostasis was obtained.  A dry sterile dressing was applied. After application of the dressing the tourniquet was removed and there is found to be an immediate capillary refill time to  the digit. The patient tolerated the procedure well any complications. Post procedure instructions were discussed the patient for which he verbally understood.  Discussed signs/symptoms of worsening infection and directed to call the office immediately should any occur or go directly to the emergency room. In the meantime, encouraged to call the office with any questions, concerns, changes symptoms. -Surgical shoe dispensed  -Patient encouraged to call the office with any questions, concerns, change in symptoms.   Trula Slade DPM

## 2022-04-04 ENCOUNTER — Other Ambulatory Visit (HOSPITAL_COMMUNITY): Payer: Self-pay

## 2022-04-12 ENCOUNTER — Ambulatory Visit (INDEPENDENT_AMBULATORY_CARE_PROVIDER_SITE_OTHER): Payer: 59 | Admitting: Podiatry

## 2022-04-12 ENCOUNTER — Encounter: Payer: Self-pay | Admitting: Podiatry

## 2022-04-12 DIAGNOSIS — L600 Ingrowing nail: Secondary | ICD-10-CM

## 2022-04-12 NOTE — Progress Notes (Signed)
Patient seen at the office for nail check to right hallux. Nail was completely removed on 03/29/22. No signs of infection at this time. Patient is soaking foot and covering right hallux toe bed with a band aid. Advise patient to discontinue soaking and to not cover toe with a band-aid.   Patient denies any fever, nausea, vomiting, chills or chest pain at this time.   Advise patient to continue to monitor toe bed. If any signs and symptoms of infection occur to go to the ED. If she has any questions or concerns to contact the office.

## 2022-04-27 ENCOUNTER — Other Ambulatory Visit (HOSPITAL_COMMUNITY): Payer: Self-pay

## 2022-04-27 ENCOUNTER — Emergency Department (HOSPITAL_COMMUNITY)
Admission: EM | Admit: 2022-04-27 | Discharge: 2022-04-27 | Disposition: A | Discharge status: Home / Self Care | Payer: 59 | Attending: Emergency Medicine | Admitting: Emergency Medicine

## 2022-04-27 ENCOUNTER — Emergency Department (HOSPITAL_COMMUNITY): Payer: 59

## 2022-04-27 ENCOUNTER — Other Ambulatory Visit: Payer: Self-pay

## 2022-04-27 DIAGNOSIS — W1839XA Other fall on same level, initial encounter: Secondary | ICD-10-CM | POA: Diagnosis not present

## 2022-04-27 DIAGNOSIS — M545 Low back pain, unspecified: Secondary | ICD-10-CM | POA: Insufficient documentation

## 2022-04-27 DIAGNOSIS — Y92000 Kitchen of unspecified non-institutional (private) residence as  the place of occurrence of the external cause: Secondary | ICD-10-CM | POA: Diagnosis not present

## 2022-04-27 DIAGNOSIS — E119 Type 2 diabetes mellitus without complications: Secondary | ICD-10-CM | POA: Insufficient documentation

## 2022-04-27 DIAGNOSIS — Z7984 Long term (current) use of oral hypoglycemic drugs: Secondary | ICD-10-CM | POA: Insufficient documentation

## 2022-04-27 DIAGNOSIS — M25512 Pain in left shoulder: Secondary | ICD-10-CM | POA: Insufficient documentation

## 2022-04-27 DIAGNOSIS — Z96642 Presence of left artificial hip joint: Secondary | ICD-10-CM | POA: Diagnosis not present

## 2022-04-27 DIAGNOSIS — W19XXXA Unspecified fall, initial encounter: Secondary | ICD-10-CM

## 2022-04-27 DIAGNOSIS — M546 Pain in thoracic spine: Secondary | ICD-10-CM | POA: Diagnosis not present

## 2022-04-27 LAB — CBG MONITORING, ED: Glucose-Capillary: 85 mg/dL (ref 70–99)

## 2022-04-27 MED ORDER — TIZANIDINE HCL 4 MG PO TABS
4.0000 mg | ORAL_TABLET | Freq: Four times a day (QID) | ORAL | 0 refills | Status: DC | PRN
  Filled 2022-04-27: qty 30, 8d supply, fill #0

## 2022-04-27 NOTE — ED Provider Triage Note (Signed)
Emergency Medicine Provider Triage Evaluation Note  Savannah Lewis , a 81 y.o. female  was evaluated in triage.  Pt complains of left shoulder pain and mid back pain following a fall an hour ago.  Was restocking some shelves, did not see a box of bananas on the floor.  Her foot clipped it, she tripped and fell down.  Landed on her left shoulder.  Denies hip pain, neck pain, head pain, or LOC.  Denies lightheadedness or dizziness before or after the fall.  Not on anticoagulation.  Hx of DMT2, noninsulin-dependent.  No other complaints at this time.  Review of Systems  Positive:  Negative: See above  Physical Exam  BP (!) 179/74 (BP Location: Right Arm)   Pulse 91   Temp 97.9 F (36.6 C) (Oral)   Resp 16   Ht '5\' 2"'$  (1.575 m)   Wt 54.4 kg   SpO2 100%   BMI 21.95 kg/m  Gen:   Awake, no distress   Resp:  Normal effort  MSK:   Moves extremities without difficulty with the exception of the left shoulder due to elicited tenderness. Other:  Radial pulses 2+ bilaterally.  Pelvis stable without tenderness.  Head nontender.  No C-spine tenderness or torticollis.  PERRLA.  Medical Decision Making  Medically screening exam initiated at 9:28 AM.  Appropriate orders placed.  Savannah Lewis was informed that the remainder of the evaluation will be completed by another provider, this initial triage assessment does not replace that evaluation, and the importance of remaining in the ED until their evaluation is complete.     Prince Rome, PA-C 94/07/68 0930

## 2022-04-27 NOTE — Discharge Instructions (Addendum)
Please return to ED with any new or worsening symptoms such as lower extremity weakness, inability to control bowel or bladder, groin numbness Please follow up with PCP Please begin taking NSAIDs such as ibuprofen for pain relief. Take muscle relaxers I have prescribed as well. Please read attached guides concerning joint pain and back pain

## 2022-04-27 NOTE — ED Provider Notes (Signed)
[facility] EMERGENCY DEPARTMENT Provider Note   CSN: [id number] Arrival date & time: 04/27/22  7124     History  Chief Complaint  Patient presents with   Fall   Back Pain   Arm Pain    Savannah Lewis is a 81 y.o. female with medical history of cervical spondylosis, degenerative disc disease, type 2 diabetes, attention.  Patient presents to ED for evaluation mechanical ground-level fall.  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 Patient reports this morning at work she tripped over a box of bananas that were on the floor causing herself to have a ground level fall onto her left side. The patient denies hitting her head, losing consciousness, being on blood thinners. The patient is now complaining of left sided shoulder pain as well as left sided low back pain. Patient denies any numbness or tingling, denies any saddle anesthesia, lower extremity weakness, bowel or bladder dysfunction, fevers, lightheadedness, dizziness, weakness. Patient denies nausea, vomiting, neck pain.   Fall Pertinent negatives include no headaches.  Back Pain Associated symptoms: no fever, no headaches, no numbness and no weakness   Arm Pain Pertinent negatives include no headaches.       Home Medications Prior to Admission medications   Medication Sig Start Date End Date Taking? Authorizing Provider  tiZANidine (ZANAFLEX) 4 MG tablet Take 1 tablet (4 mg total) by mouth every 6 (six) hours as needed for muscle spasms. 04/27/22  Yes Azucena Cecil, PA-C  amLODipine (NORVASC) 10 MG tablet Take 1 tablet by mouth daily.    [provider]  ascorbic acid (VITAMIN C) 500 MG tablet  09/18/19   [provider]  Blood Glucose Monitoring Suppl (FREESTYLE LITE) w/Device KIT Use as directed to test blood sugar once a day and as needed. 03/06/21     Calcium Carb-Cholecalciferol (CALCIUM 600 + D) 600-5 MG-MCG TABS 1 tablet    [provider]  calcium  carbonate (OSCAL) 1500 (600 Ca) MG TABS tablet Take 600 mg of elemental calcium by mouth daily with breakfast.     [provider]  cephALEXin (KEFLEX) 500 MG capsule cephalexin 500 mg capsule    [provider]  Cetirizine HCl (ZYRTEC ALLERGY) 10 MG CAPS as needed. 05/29/19   [provider]  Cholecalciferol (VITAMIN D3) 10 MCG (400 UNIT) tablet as needed. 09/18/19   [provider]  furosemide (LASIX) 40 MG tablet Take 1 tablet by mouth once daily for 3 days 12/07/20     glucose blood (ACCU-CHEK AVIVA PLUS) test strip USE TO TEST BLOOD SUGAR ONCE A DAY AND AS NEEDED 90 days 03/06/21     glucose blood test strip Use as directed to test blood sugar once a day and as needed. 03/06/21     guaifenesin (HUMIBID E) 400 MG TABS tablet Take 1 tablet by mouth 3 times daily as needed for chest congestion and cough 09/27/21   Lynden Oxford Scales, PA-C  ipratropium (ATROVENT) 0.06 % nasal spray Place 2 sprays into both nostrils 4 (four) times daily as needed for nasal congestion, runny nose 09/27/21   Lynden Oxford Scales, PA-C  Lancets (FREESTYLE) lancets Use as directed to test blood sugar once a day and as needed. 03/06/21     metFORMIN (GLUCOPHAGE) 500 MG tablet Take 2 tablets by mouth twice a day. 06/01/21     Multiple Vitamins-Minerals (MULTIVITAMIN WITH MINERALS) tablet Take 1 tablet by mouth daily.    [provider]  telmisartan-hydrochlorothiazide (MICARDIS HCT)  80-12.5 MG tablet Take 1 tablet by mouth once daily. 12/13/21         Allergies    Patient has no known allergies.    Review of Systems   Review of Systems  Constitutional:  Negative for fever.  Gastrointestinal:  Negative for nausea and vomiting.  Musculoskeletal:  Positive for back pain. Negative for neck pain.  Neurological:  Negative for dizziness, syncope, weakness, light-headedness, numbness and headaches.  All other systems reviewed and are negative.   Physical Exam Updated Vital  Signs BP (!) 180/78 (BP Location: Left Arm)   Pulse 90   Temp 97.8 F (36.6 C) (Oral)   Resp 15   Ht '5\' 2"'  (1.575 m)   Wt 54.4 kg   SpO2 100%   BMI 21.95 kg/m  Physical Exam Vitals and nursing note reviewed.  Constitutional:      General: She is not in acute distress.    Appearance: Normal appearance. She is not ill-appearing, toxic-appearing or diaphoretic.  HENT:     Head: Normocephalic and atraumatic.     Nose: Nose normal. No congestion.     Mouth/Throat:     Mouth: Mucous membranes are moist.     Pharynx: Oropharynx is clear.  Eyes:     Extraocular Movements: Extraocular movements intact.     Conjunctiva/sclera: Conjunctivae normal.     Pupils: Pupils are equal, round, and reactive to light.  Cardiovascular:     Rate and Rhythm: Normal rate and regular rhythm.  Pulmonary:     Effort: Pulmonary effort is normal.     Breath sounds: Normal breath sounds. No wheezing.  Abdominal:     General: Abdomen is flat. Bowel sounds are normal.     Palpations: Abdomen is soft.     Tenderness: There is no abdominal tenderness.  Musculoskeletal:     Right shoulder: Normal.     Left shoulder: Tenderness present. No swelling, deformity, effusion or laceration. Normal range of motion.     Cervical back: Normal range of motion and neck supple. No tenderness.     Lumbar back: Normal.     Comments: Patient with nonfocal tenderness to palpation generalized to entire shoulder.  Patient left shoulder has intact range of motion actively. No overlying skin change, crepitus.  No deformity.  Patient with left-sided paraspinal tenderness however no centralized lumbar spinal tenderness, no step-off, no crepitus.  Skin:    General: Skin is warm and dry.     Capillary Refill: Capillary refill takes less than 2 seconds.  Neurological:     General: No focal deficit present.     Mental Status: She is alert and oriented to person, place, and time.     GCS: GCS eye subscore is 4. GCS verbal subscore  is 5. GCS motor subscore is 6.     Cranial Nerves: Cranial nerves 2-12 are intact. No cranial nerve deficit.     Sensory: Sensation is intact. No sensory deficit.     Motor: Motor function is intact. No weakness.     Coordination: Coordination is intact. Heel to [facility] Test normal.     ED Results / Procedures / Treatments   Labs (all labs ordered are listed, but only abnormal results are displayed) Labs Reviewed  CBG MONITORING, ED    EKG None  Radiology DG Thoracic Spine 2 View  Result Date: 04/27/2022 CLINICAL DATA:  Back pain after fall EXAM: THORACIC SPINE 2 VIEWS COMPARISON:  CT July 13, 2021. FINDINGS: There is no evidence  of thoracic spine fracture. Alignment is normal. Multilevel degenerative changes spine with flowing anterior vertebral osteophytes. Left shoulder arthroplasty. Visualized lung fields are clear. Aortic atherosclerosis. IMPRESSION: 1. No acute osseous abnormality. 2. Aortic Atherosclerosis (ICD10-I70.0). Electronically Signed   By: Dahlia Bailiff M.D.   On: 04/27/2022 10:04   DG Shoulder Left  Result Date: 04/27/2022 CLINICAL DATA:  Status post fall.  Left shoulder pain. EXAM: LEFT SHOULDER - 2+ VIEW COMPARISON:  None Available. FINDINGS: Generalized osteopenia. Left total reverse shoulder arthroplasty. No acute fracture or dislocation. No hardware failure or complication. No aggressive osseous lesion. Normal alignment. Soft tissue are unremarkable. No radiopaque foreign body or soft tissue emphysema. IMPRESSION: 1. No acute osseous injury of the left shoulder. Electronically Signed   By: Kathreen Devoid M.D.   On: 04/27/2022 10:03    Procedures Procedures   Medications Ordered in ED Medications - No data to display  ED Course/ Medical Decision Making/ A&P                           Medical Decision Making Risk Prescription drug management.   81 year old female presents to the ED for evaluation.  Please see H PI for further details.  Plain film imaging of  patient lumbar spine shows no acute pathology or abnormality. Plain film imaging of patient left shoulder shows no abnormality, dislocation.  Patient has intact range of motion to her left shoulder with nonfocal and generalized tenderness. Patient also has intact range of motion to low back with left sided tenderness however no obvious deformity or injury. Patient will be advised to take NSAIDs for pain relief, will be discharged with low dose muscle relaxer and encouraged to purchase salonpas patches for pain relief. The patient will be advised to follow up with PCP for further management.   Return precautions given and patient voiced understanding. Patient had all of her questions answered to her satisfaction. Patient stable for discharge.   Final Clinical Impression(s) / ED Diagnoses Final diagnoses:  Fall, initial encounter  Acute left-sided low back pain without sciatica  Acute pain of left shoulder    Rx / DC Orders ED Discharge Orders          Ordered    tiZANidine (ZANAFLEX) 4 MG tablet  Every 6 hours PRN        04/27/22 1237              Azucena Cecil, PA-C 04/27/22 1249    Charlesetta Shanks, MD 05/08/22 1207

## 2022-04-27 NOTE — ED Triage Notes (Signed)
Pt. Stated, I fell over a box in the kitchen this morning. My back and left arm hurts.

## 2022-04-30 ENCOUNTER — Other Ambulatory Visit (HOSPITAL_COMMUNITY): Payer: Self-pay

## 2022-04-30 MED ORDER — FREESTYLE LITE TEST VI STRP
ORAL_STRIP | 3 refills | Status: DC
  Filled 2022-04-30: qty 100, 90d supply, fill #0
  Filled 2023-02-20: qty 100, 90d supply, fill #1

## 2022-04-30 MED ORDER — FREESTYLE LANCETS MISC
3 refills | Status: DC
  Filled 2022-04-30: qty 100, 90d supply, fill #0
  Filled 2023-02-20: qty 100, 90d supply, fill #1

## 2022-05-01 ENCOUNTER — Other Ambulatory Visit (HOSPITAL_COMMUNITY): Payer: Self-pay

## 2022-05-17 DIAGNOSIS — E1169 Type 2 diabetes mellitus with other specified complication: Secondary | ICD-10-CM | POA: Diagnosis not present

## 2022-05-17 DIAGNOSIS — D509 Iron deficiency anemia, unspecified: Secondary | ICD-10-CM | POA: Diagnosis not present

## 2022-05-17 DIAGNOSIS — E7800 Pure hypercholesterolemia, unspecified: Secondary | ICD-10-CM | POA: Diagnosis not present

## 2022-05-21 DIAGNOSIS — H9312 Tinnitus, left ear: Secondary | ICD-10-CM | POA: Diagnosis not present

## 2022-05-21 DIAGNOSIS — H903 Sensorineural hearing loss, bilateral: Secondary | ICD-10-CM | POA: Diagnosis not present

## 2022-05-21 DIAGNOSIS — M26609 Unspecified temporomandibular joint disorder, unspecified side: Secondary | ICD-10-CM | POA: Diagnosis not present

## 2022-06-01 ENCOUNTER — Encounter (HOSPITAL_COMMUNITY): Payer: Self-pay

## 2022-06-01 ENCOUNTER — Ambulatory Visit (HOSPITAL_COMMUNITY)
Admission: EM | Admit: 2022-06-01 | Discharge: 2022-06-01 | Disposition: A | Discharge status: Home / Self Care | Payer: 59 | Attending: Internal Medicine | Admitting: Internal Medicine

## 2022-06-01 DIAGNOSIS — M5432 Sciatica, left side: Secondary | ICD-10-CM | POA: Diagnosis not present

## 2022-06-01 MED ORDER — METHYLPREDNISOLONE SODIUM SUCC 125 MG IJ SOLR
60.0000 mg | Freq: Once | INTRAMUSCULAR | Status: AC
  Administered 2022-06-01: 60 mg via INTRAMUSCULAR

## 2022-06-01 MED ORDER — METHYLPREDNISOLONE SODIUM SUCC 125 MG IJ SOLR
INTRAMUSCULAR | Status: AC
  Filled 2022-06-01: qty 2

## 2022-06-01 NOTE — ED Triage Notes (Signed)
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 Tripped over the boxes in the room. Patient had x-rays done in the ER on that day and they were clear. Patient was given muscle relaxer and ibuprofen.    States for the past week having hip pain in the left side. Pain is radiating down her leg. Pain with movement.

## 2022-06-01 NOTE — Discharge Instructions (Addendum)
We gave you a steroid injection in the clinic today to help with your left hip pain that is due to sciatica.  Continue taking Aleve at home as needed for pain and inflammation.  Take this medicine with food to avoid stomach upset.  Apply heat to the left hip and perform gentle range of motion exercises to the area to prevent muscle stiffness.  I would like for you to follow-up with your primary care provider in the next couple of weeks regarding your left hip pain for further evaluation and management of this as they may be able to refer you to physical therapy for exercises to prevent this type of pain in the future.  If you develop any new or worsening symptoms or do not improve in the next 2 to 3 days, please return.  If your symptoms are severe, please go to the emergency room.  Follow-up with your primary care provider for further evaluation and management of your symptoms as well as ongoing wellness visits.  I hope you feel better!

## 2022-06-01 NOTE — ED Provider Notes (Signed)
[employer]    CSN: [id number] Arrival date & time: 06/01/22  1326      History   Chief Complaint Chief Complaint  Patient presents with   Fall   Hip Pain    HPI Savannah Lewis is a 81 y.o. female.   Patient presents urgent care for evaluation of left-sided hip pain with associated shooting pains down the back of the left leg.  She had a fall onto the left hip on April 27, 2022 and was seen in the ED for left hip pain at that time.  X-rays were negative in early September for dislocation or fracture to the left hip and her pain improved significantly over the last month but returned 1 week ago.  Pain is worse with movement and is currently a 5 on a scale 0-10.  Denies urinary symptoms, recent injuries after falling in September, constipation, bowel/bladder incontinence, saddle anesthesia symptoms, and numbness or tingling to the bilateral lower extremities.  She is a diabetic and states that her sugars are generally well controlled.    Blood pressure is noticeably elevated today at 197/80.  Patient states that she usually takes her daily medicines when she gets home from work at approximately 1 to 2 PM every day and has not had her blood pressure medication yet today.  Denies headache, nausea, vomiting, fever/chills, blurry vision, decreased visual acuity, and dizziness.   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)   History of Bell's palsy    2003  RIGHT SIDE-  resolved   Hypertension    Thoracic aortic aneurysm ([place])    followed by Dr Tharon Aquas  Trigt- LOV- 02/2013 in [facility]    Thoracic ascending aortic aneurysm ([place])    4.1 CM  STABLE SINCE 2004 PER DR VANTRIGHT NOTE 02/2013   TMJ (temporomandibular joint syndrome)    Type 2 diabetes mellitus ([place])    Wears glasses     Patient Active Problem List   Diagnosis Date Noted   H/O total shoulder replacement, 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   S/P knee replacement 08/31/2014   Thoracic ascending aortic aneurysm ([place])    Hypertension    Arthritis    Diabetes ([place])    Diabetes mellitus ([place]) 09/06/2012   HTN (hypertension) 09/06/2012    Past Surgical History:  Procedure Laterality Date   ABDOMINAL HYSTERECTOMY  1995   W/ UNILATERAL SALPINGOOPHORECTOMY   COLONOSCOPY N/A 04/14/2013   Procedure: COLONOSCOPY;  Surgeon: Garlan Fair, MD;  Location: [facility];  Service: Endoscopy;  Laterality: N/A;   DORSAL COMPARTMENT RELEASE Left 09/24/2013   Procedure: LEFT WRIST FIRST DORSAL COMPARTMENT TENOSYNOVITIS;  Surgeon: Linna Hoff, MD;  Location: [facility];  Service: Orthopedics;  Laterality: Left;  ANESTHESIA: LOCAL/IV SEDATION   JOINT REPLACEMENT     KNEE ARTHROSCOPY Bilateral RIGHT 2002 & 1994/   LEFT  1995   LAPAROSCOPY ASSISTED  RIGHT COLECTOMY  03-22-2005   CECUM POLYP  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/2018   Procedure: REVERSE LEFT SHOULDER ARTHROPLASTY;  Surgeon: Netta Cedars, MD;  Location: [facility];  Service: Orthopedics;  Laterality: Left;   ROTATOR CUFF REPAIR Right 08-31-2008   TOTAL KNEE ARTHROPLASTY Right 07-01-2009   TOTAL KNEE ARTHROPLASTY Left 08/31/2014   Procedure: LEFT TOTAL KNEE ARTHROPLASTY;  Surgeon: Sydnee Cabal, MD;  Location: [facility];  Service: Orthopedics;  Laterality: Left;    OB History   No obstetric history on file.      Home Medications    Prior to Admission medications   Medication Sig Start Date End Date Taking? Authorizing Provider  amLODipine (NORVASC) 10 MG tablet Take 0.5 tablets  by mouth daily.   Yes [provider]  ascorbic acid (VITAMIN C) 500 MG tablet  09/18/19  Yes [provider]  Blood Glucose Monitoring Suppl (FREESTYLE LITE) w/Device KIT Use as directed to test blood sugar once a day and as needed. 03/06/21  Yes   Calcium Carb-Cholecalciferol (CALCIUM 600 + D) 600-5 MG-MCG TABS 1 tablet   Yes [provider]  calcium carbonate (OSCAL) 1500 (600 Ca) MG TABS tablet Take 600 mg of elemental calcium by mouth daily with breakfast.    Yes [provider]  cephALEXin (KEFLEX) 500 MG capsule cephalexin 500 mg capsule   Yes [provider]  Cetirizine HCl (ZYRTEC ALLERGY) 10 MG CAPS as needed. 05/29/19  Yes [provider]  Cholecalciferol (VITAMIN D3) 10 MCG (400 UNIT) tablet as needed. 09/18/19  Yes [provider]  furosemide (LASIX) 40 MG tablet Take 1 tablet by mouth once daily for 3 days 12/07/20  Yes   glucose blood (ACCU-CHEK AVIVA PLUS) test strip USE TO TEST BLOOD SUGAR ONCE A DAY AND AS NEEDED 90 days 03/06/21  Yes   glucose blood (FREESTYLE LITE) test strip Use as directed to test blood sugar once daily and as needed 04/30/22  Yes Lavone Orn, MD  guaifenesin (HUMIBID E) 400 MG TABS tablet Take 1 tablet by mouth 3 times daily as needed for chest congestion and cough 09/27/21  Yes Lynden Oxford Scales, PA-C  ipratropium (ATROVENT) 0.06 % nasal spray Place 2 sprays into both nostrils 4 (four) times daily as needed for nasal congestion, runny nose 09/27/21  Yes Lynden Oxford Scales, PA-C  Lancets (FREESTYLE) lancets Use as directed to test blood sugar once a day and as needed 04/30/22  Yes Lavone Orn, MD  metFORMIN (GLUCOPHAGE) 500 MG tablet Take 2 tablets by mouth twice a day. 06/01/21  Yes   Multiple Vitamins-Minerals (MULTIVITAMIN WITH MINERALS) tablet Take 1 tablet by mouth daily.   Yes [provider]  telmisartan-hydrochlorothiazide (MICARDIS HCT) 80-12.5 MG tablet Take 1 tablet by mouth  once daily. 12/13/21  Yes   tiZANidine (ZANAFLEX) 4 MG tablet Take 1 tablet (4 mg total) by mouth every 6 (six) hours as needed for muscle spasms. 04/27/22  Yes Azucena Cecil, PA-C    Family History Family History  Problem Relation Age of Onset   Cancer Mother    Diabetes Father     Social History Social History   Tobacco Use   Smoking status: Former    Years: 2.00    Types: Cigarettes    Quit date: 08/21/1959    Years since quitting: 62.8   Smokeless tobacco: Never  Vaping Use   Vaping Use: Never used  Substance Use Topics   Alcohol use: No   Drug use: No     Allergies   Patient  has no known allergies.   Review of Systems Review of Systems Per HPI  Physical Exam Triage Vital Signs ED Triage Vitals  Enc Vitals Group     BP 06/01/22 1450 (!) 197/80     Pulse Rate 06/01/22 1450 89     Resp 06/01/22 1450 16     Temp 06/01/22 1450 98.2 F (36.8 C)     Temp Source 06/01/22 1450 Oral     SpO2 06/01/22 1450 98 %     Weight --      Height --      Head Circumference --      Peak Flow --      Pain Score 06/01/22 1454 5     Pain Loc --      Pain Edu? --      Excl. in [place]? --    No data found.  Updated Vital Signs BP (!) 197/80 (BP Location: Left Arm)   Pulse 89   Temp 98.2 F (36.8 C) (Oral)   Resp 16   SpO2 98%   Visual Acuity Right Eye Distance:   Left Eye Distance:   Bilateral Distance:    Right Eye Near:   Left Eye Near:    Bilateral Near:     Physical Exam Vitals and nursing note reviewed.  Constitutional:      Appearance: She is not ill-appearing or toxic-appearing.  HENT:     Head: Normocephalic and atraumatic.     Right Ear: Hearing and external ear normal.     Left Ear: Hearing and external ear normal.     Nose: Nose normal.     Mouth/Throat:     Lips: Pink.     Pharynx: No posterior oropharyngeal erythema.  Eyes:     General: Lids are normal. Vision grossly intact. Gaze aligned appropriately.     Extraocular Movements:  Extraocular movements intact.     Conjunctiva/sclera: Conjunctivae normal.  Pulmonary:     Effort: Pulmonary effort is normal.  Musculoskeletal:     Cervical back: Neck supple.     Right hip: Normal.     Left hip: Tenderness present. No deformity or crepitus. Normal range of motion. Normal strength.     Comments: Mild tenderness to palpation of the left sciatic nerve joint with radicular pain elicited to the posterior left leg.  No obvious deformity, ecchymosis, or sign of injury to the lumbar spine and left hip.  Neurovascularly intact distal to injury.  Normal range of motion to the lumbar spine and left hip.  Skin:    General: Skin is warm and dry.     Capillary Refill: Capillary refill takes less than 2 seconds.     Findings: No rash.  Neurological:     General: No focal deficit present.     Mental Status: She is alert and oriented to person, place, and time. Mental status is at baseline.     Cranial Nerves: Cranial nerves 2-12 are intact. No dysarthria or facial asymmetry.     Sensory: Sensation is intact.     Motor: Motor function is intact. No weakness.     Coordination: Coordination is intact.     Gait: Gait normal.     Comments: Ambulatory with a steady gait without assistance.  5/5 strength to bilateral upper and lower extremities against resistance.  Sensation is intact bilaterally.  Cranial nerves are intact.  No focal deficit to physical exam.  Psychiatric:        Mood and Affect:  Mood normal.        Speech: Speech normal.        Behavior: Behavior normal.        Thought Content: Thought content normal.        Judgment: Judgment normal.      UC Treatments / Results  Labs (all labs ordered are listed, but only abnormal results are displayed) Labs Reviewed - No data to display  EKG   Radiology No results found.  Procedures Procedures (including critical care time)  Medications Ordered in UC Medications  methylPREDNISolone sodium succinate (SOLU-MEDROL) 125  mg/2 mL injection 60 mg (60 mg Intramuscular Given 06/01/22 1531)    Initial Impression / Assessment and Plan / UC Course  I have reviewed the triage vital signs and the nursing notes.  Pertinent labs & imaging results that were available during my care of the patient were reviewed by me and considered in my medical decision making (see chart for details).   1.  Sciatica of left side Symptoms and physical exam are consistent with acute sciatic nerve pain of the left hip and leg.  Solu-Medrol 60 mg injection given in clinic to help with inflammation related to his left-sided sciatica.  This may increase patient's blood sugars but patient states that she is aware of this and will closely monitor them over the next couple of days and monitor her diet.  Patient is to follow-up with her PCP for a well visit and for ongoing care of left-sided sciatic nerve pain as she may be a good candidate for physical therapy exercises to prevent this type of pain in the future.  She may continue use of Aleve anti-inflammatory medication starting tomorrow for the next few days to reduce inflammation and pain to the left leg and hip.  Kidney function from last BMP in 2022 is normal.  She has never had elevated creatinine in the past.  Heat and gentle range of motion exercises advised.  There are no red flag signs or symptoms to physical exam indicating need for imaging or referral to higher level of care.  She is clinically well-appearing with stable vital signs and has been advised to go home and take her blood pressure medication.   Discussed physical exam and available lab work findings in clinic with patient.  Counseled patient regarding appropriate use of medications and potential side effects for all medications recommended or prescribed today. Discussed red flag signs and symptoms of worsening condition,when to call the PCP office, return to urgent care, and when to seek higher level of care in the emergency  department. Patient verbalizes understanding and agreement with plan. All questions answered. Patient discharged in stable condition.    Final Clinical Impressions(s) / UC Diagnoses   Final diagnoses:  Sciatica of left side     Discharge Instructions      We gave you a steroid injection in the clinic today to help with your left hip pain that is due to sciatica.  Continue taking Aleve at home as needed for pain and inflammation.  Take this medicine with food to avoid stomach upset.  Apply heat to the left hip and perform gentle range of motion exercises to the area to prevent muscle stiffness.  I would like for you to follow-up with your primary care provider in the next couple of weeks regarding your left hip pain for further evaluation and management of this as they may be able to refer you to physical therapy for exercises to prevent  this type of pain in the future.  If you develop any new or worsening symptoms or do not improve in the next 2 to 3 days, please return.  If your symptoms are severe, please go to the emergency room.  Follow-up with your primary care provider for further evaluation and management of your symptoms as well as ongoing wellness visits.  I hope you feel better!     ED Prescriptions   None    PDMP not reviewed this encounter.   Talbot Grumbling, [place] 06/01/22 [phone]

## 2022-06-13 ENCOUNTER — Other Ambulatory Visit (HOSPITAL_COMMUNITY): Payer: Self-pay

## 2022-06-13 DIAGNOSIS — I10 Essential (primary) hypertension: Secondary | ICD-10-CM | POA: Diagnosis not present

## 2022-06-13 DIAGNOSIS — M5442 Lumbago with sciatica, left side: Secondary | ICD-10-CM | POA: Diagnosis not present

## 2022-06-13 MED ORDER — AMLODIPINE BESYLATE 5 MG PO TABS
5.0000 mg | ORAL_TABLET | Freq: Every day | ORAL | 3 refills | Status: DC
  Filled 2022-06-13: qty 90, 90d supply, fill #0
  Filled 2022-09-06: qty 90, 90d supply, fill #1
  Filled 2022-12-05: qty 90, 90d supply, fill #2
  Filled 2023-03-25: qty 90, 90d supply, fill #3

## 2022-06-21 ENCOUNTER — Other Ambulatory Visit (HOSPITAL_COMMUNITY): Payer: Self-pay

## 2022-06-23 ENCOUNTER — Other Ambulatory Visit (HOSPITAL_COMMUNITY): Payer: Self-pay

## 2022-07-03 DIAGNOSIS — M5416 Radiculopathy, lumbar region: Secondary | ICD-10-CM | POA: Diagnosis not present

## 2022-07-05 ENCOUNTER — Ambulatory Visit
Admission: RE | Admit: 2022-07-05 | Discharge: 2022-07-05 | Disposition: A | Discharge status: Home / Self Care | Payer: 59 | Source: Ambulatory Visit | Attending: Physician Assistant | Admitting: Physician Assistant

## 2022-07-05 ENCOUNTER — Other Ambulatory Visit: Payer: Self-pay | Admitting: Physician Assistant

## 2022-07-05 ENCOUNTER — Other Ambulatory Visit (HOSPITAL_COMMUNITY): Payer: Self-pay

## 2022-07-05 DIAGNOSIS — M25552 Pain in left hip: Secondary | ICD-10-CM

## 2022-07-05 DIAGNOSIS — M549 Dorsalgia, unspecified: Secondary | ICD-10-CM | POA: Diagnosis not present

## 2022-07-05 DIAGNOSIS — M4316 Spondylolisthesis, lumbar region: Secondary | ICD-10-CM | POA: Diagnosis not present

## 2022-07-05 DIAGNOSIS — M4804 Spinal stenosis, thoracic region: Secondary | ICD-10-CM | POA: Diagnosis not present

## 2022-07-05 DIAGNOSIS — M160 Bilateral primary osteoarthritis of hip: Secondary | ICD-10-CM | POA: Diagnosis not present

## 2022-07-05 DIAGNOSIS — M4807 Spinal stenosis, lumbosacral region: Secondary | ICD-10-CM | POA: Diagnosis not present

## 2022-07-05 DIAGNOSIS — M1612 Unilateral primary osteoarthritis, left hip: Secondary | ICD-10-CM | POA: Diagnosis not present

## 2022-07-05 MED ORDER — MELOXICAM 15 MG PO TABS
15.0000 mg | ORAL_TABLET | Freq: Every day | ORAL | 2 refills | Status: AC
  Filled 2022-07-05: qty 30, 30d supply, fill #0

## 2022-07-31 ENCOUNTER — Encounter (HOSPITAL_COMMUNITY): Payer: Self-pay

## 2022-07-31 ENCOUNTER — Emergency Department (HOSPITAL_COMMUNITY)
Admission: EM | Admit: 2022-07-31 | Discharge: 2022-08-01 | Disposition: A | Discharge status: Home / Self Care | Payer: 59 | Attending: Emergency Medicine | Admitting: Emergency Medicine

## 2022-07-31 DIAGNOSIS — Z7984 Long term (current) use of oral hypoglycemic drugs: Secondary | ICD-10-CM | POA: Insufficient documentation

## 2022-07-31 DIAGNOSIS — Z96612 Presence of left artificial shoulder joint: Secondary | ICD-10-CM | POA: Diagnosis not present

## 2022-07-31 DIAGNOSIS — M5432 Sciatica, left side: Secondary | ICD-10-CM

## 2022-07-31 DIAGNOSIS — M5442 Lumbago with sciatica, left side: Secondary | ICD-10-CM | POA: Diagnosis not present

## 2022-07-31 DIAGNOSIS — M545 Low back pain, unspecified: Secondary | ICD-10-CM | POA: Diagnosis not present

## 2022-07-31 DIAGNOSIS — M546 Pain in thoracic spine: Secondary | ICD-10-CM | POA: Diagnosis not present

## 2022-07-31 DIAGNOSIS — M549 Dorsalgia, unspecified: Secondary | ICD-10-CM | POA: Diagnosis not present

## 2022-07-31 DIAGNOSIS — M8588 Other specified disorders of bone density and structure, other site: Secondary | ICD-10-CM | POA: Diagnosis not present

## 2022-07-31 DIAGNOSIS — Z471 Aftercare following joint replacement surgery: Secondary | ICD-10-CM | POA: Diagnosis not present

## 2022-07-31 NOTE — ED Provider Triage Note (Signed)
Emergency Medicine Provider Triage Evaluation Note  Savannah Lewis , a 81 y.o. female  was evaluated in triage.  Pt complains of left leg pain x last month. According to records prior hx of lumbar radiculopathy and left sided sciatica, no improvement in her pain despite OTC meds.   Review of Systems  Positive: Back pain, left leg pain Negative: Fever, bowel or bladder complaints  Physical Exam  BP (!) 193/67 (BP Location: Right Arm)   Pulse 92   Temp 97.7 F (36.5 C) (Oral)   Resp 16   SpO2 100%  Gen:   Awake, no distress   Resp:  Normal effort  MSK:   Moves extremities without difficulty  Other:  Good strength to BLLE  Medical Decision Making  Medically screening exam initiated at 6:20 PM.  Appropriate orders placed.  Savannah Lewis was informed that the remainder of the evaluation will be completed by another provider, this initial triage assessment does not replace that evaluation, and the importance of remaining in the ED until their evaluation is complete.     Janeece Fitting, PA-C 07/31/22 1821

## 2022-07-31 NOTE — ED Triage Notes (Signed)
Pt came in via POV for intermittent back pain that has been present the last month. She reports falling on her Lt side on Sep 8th, was seen in ED, no injuries reported. Endorses shooting pains down her Lt leg plus felling cold all over in waves, A/Ox4, rates pain 9/10. Has seen providers about these symptoms but unknowing if this is sciatica, arthritis or from where she fell in September (per pt).

## 2022-08-01 ENCOUNTER — Emergency Department (HOSPITAL_COMMUNITY): Payer: 59

## 2022-08-01 ENCOUNTER — Other Ambulatory Visit: Payer: Self-pay

## 2022-08-01 ENCOUNTER — Other Ambulatory Visit (HOSPITAL_COMMUNITY): Payer: Self-pay

## 2022-08-01 DIAGNOSIS — M546 Pain in thoracic spine: Secondary | ICD-10-CM | POA: Diagnosis not present

## 2022-08-01 DIAGNOSIS — M5442 Lumbago with sciatica, left side: Secondary | ICD-10-CM | POA: Diagnosis not present

## 2022-08-01 DIAGNOSIS — Z471 Aftercare following joint replacement surgery: Secondary | ICD-10-CM | POA: Diagnosis not present

## 2022-08-01 DIAGNOSIS — Z7984 Long term (current) use of oral hypoglycemic drugs: Secondary | ICD-10-CM | POA: Diagnosis not present

## 2022-08-01 DIAGNOSIS — Z96612 Presence of left artificial shoulder joint: Secondary | ICD-10-CM | POA: Diagnosis not present

## 2022-08-01 DIAGNOSIS — M549 Dorsalgia, unspecified: Secondary | ICD-10-CM | POA: Diagnosis not present

## 2022-08-01 DIAGNOSIS — M8588 Other specified disorders of bone density and structure, other site: Secondary | ICD-10-CM | POA: Diagnosis not present

## 2022-08-01 DIAGNOSIS — M545 Low back pain, unspecified: Secondary | ICD-10-CM | POA: Diagnosis not present

## 2022-08-01 MED ORDER — LIDOCAINE 5 % EX OINT
1.0000 | TOPICAL_OINTMENT | CUTANEOUS | 0 refills | Status: DC | PRN
  Filled 2022-08-01: qty 35.44, 30d supply, fill #0

## 2022-08-01 NOTE — ED Provider Notes (Signed)
Blood pressure (!) 154/41, pulse 60, temperature (!) 97.5 F (36.4 C), resp. rate 16, SpO2 100 %.  Assuming care from Dr. Betsey Holiday.  In short, Savannah Lewis is a 81 y.o. female with a chief complaint of Back Pain .  Refer to the original H&P for additional details.  The current plan of care is to follow up on MRI and reassess.  10:03 AM  MRI shows no acute abnormality in the thoracic spine.  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  No acute abnormality requiring urgent neurosurgical intervention.  Patient overall is feeling well and has follow-up with orthopedics next week.  She plans to keep that appointment.  Discussed tricked ED return precaution.    Margette Fast, MD 08/01/22 1004

## 2022-08-01 NOTE — ED Notes (Signed)
Patient verbalizes understanding of discharge instructions. Opportunity for questioning and answers were provided. Pt discharged from ED. 

## 2022-08-01 NOTE — ED Notes (Signed)
Triage nurse, Darl Householder, RN made aware of pt diastolic number dropping to 41 on both arms.

## 2022-08-01 NOTE — Discharge Instructions (Addendum)
Please use tylenol and the topical lidocaine gel as prescribed. Please keep your appointment with your orthopedics team and make your primary care doctor aware of your ED visit and symptoms to coordinate close follow up. Return with any new or worsening symptoms.

## 2022-08-01 NOTE — ED Provider Notes (Signed)
[facility] EMERGENCY DEPARTMENT Provider Note   CSN: [id number] Arrival date & time: 07/31/22  1739     History  Chief Complaint  Patient presents with   Back Pain    Savannah Lewis is a 81 y.o. female.  Patient presents to the emergency department for evaluation of left-sided pain.  Patient reports that her pain dates back to a fall that occurred on September 8.  She was seen in the ED at that time.  She reports that her symptoms improved but then sometime in October she started having pain again.  Patient reports pain from the left shoulder blade down the left back and down the left leg.       Home Medications Prior to Admission medications   Medication Sig Start Date End Date Taking? Authorizing Provider  amLODipine (NORVASC) 10 MG tablet Take 0.5 tablets by mouth daily.    [provider]  amLODipine (NORVASC) 5 MG tablet Take 1 tablet (5 mg total) by mouth daily. 06/13/22     ascorbic acid (VITAMIN C) 500 MG tablet  09/18/19   [provider]  Blood Glucose Monitoring Suppl (FREESTYLE LITE) w/Device KIT Use as directed to test blood sugar once a day and as needed. 03/06/21     Calcium Carb-Cholecalciferol (CALCIUM 600 + D) 600-5 MG-MCG TABS 1 tablet    [provider]  calcium carbonate (OSCAL) 1500 (600 Ca) MG TABS tablet Take 600 mg of elemental calcium by mouth daily with breakfast.     [provider]  cephALEXin (KEFLEX) 500 MG capsule cephalexin 500 mg capsule    [provider]  Cetirizine HCl (ZYRTEC ALLERGY) 10 MG CAPS as needed. 05/29/19   [provider]  Cholecalciferol (VITAMIN D3) 10 MCG (400 UNIT) tablet as needed. 09/18/19   [provider]  furosemide (LASIX) 40 MG tablet Take 1 tablet by mouth once daily for 3 days 12/07/20     glucose blood (ACCU-CHEK AVIVA PLUS) test strip USE TO TEST BLOOD SUGAR ONCE A DAY AND AS NEEDED 90 days 03/06/21     glucose blood (FREESTYLE LITE) test  strip Use as directed to test blood sugar once daily and as needed 04/30/22   Lavone Orn, MD  guaifenesin (HUMIBID E) 400 MG TABS tablet Take 1 tablet by mouth 3 times daily as needed for chest congestion and cough 09/27/21   Lynden Oxford Scales, PA-C  ipratropium (ATROVENT) 0.06 % nasal spray Place 2 sprays into both nostrils 4 (four) times daily as needed for nasal congestion, runny nose 09/27/21   Lynden Oxford Scales, PA-C  Lancets (FREESTYLE) lancets Use as directed to test blood sugar once a day and as needed 04/30/22   Lavone Orn, MD  meloxicam (MOBIC) 15 MG tablet Take 1 tablet (15 mg total) by mouth daily. 07/05/22     metFORMIN (GLUCOPHAGE) 500 MG tablet Take 2 tablets by mouth twice a day. 06/01/21     Multiple Vitamins-Minerals (MULTIVITAMIN WITH MINERALS) tablet Take 1 tablet by mouth daily.    [provider]  telmisartan-hydrochlorothiazide (MICARDIS HCT) 80-12.5 MG tablet Take 1 tablet by mouth once daily. 12/13/21     tiZANidine (ZANAFLEX) 4 MG tablet Take 1 tablet (4 mg total) by mouth every 6 (six) hours as needed for muscle spasms. 04/27/22   Azucena Cecil, PA-C      Allergies    Patient has no known allergies.    Review of Systems   Review of Systems  Physical Exam Updated Vital Signs BP (!) 154/41 (BP Location: Right Arm)   Pulse 60   Temp (!) 97.5 F (36.4 C)   Resp 16   SpO2 100%  Physical Exam Vitals and nursing note reviewed.  Constitutional:      General: She is not in acute distress.    Appearance: She is well-developed.  HENT:     Head: Normocephalic and atraumatic.     Mouth/Throat:     Mouth: Mucous membranes are moist.  Eyes:     General: Vision grossly intact. Gaze aligned appropriately.     Extraocular Movements: Extraocular movements intact.     Conjunctiva/sclera: Conjunctivae normal.  Cardiovascular:     Rate and Rhythm: Normal rate and regular rhythm.     Pulses: Normal pulses.     Heart sounds: Normal heart sounds, S1  normal and S2 normal. No murmur heard.    No friction rub. No gallop.  Pulmonary:     Effort: Pulmonary effort is normal. No respiratory distress.     Breath sounds: Normal breath sounds.  Abdominal:     General: Bowel sounds are normal.     Palpations: Abdomen is soft.     Tenderness: There is no abdominal tenderness. There is no guarding or rebound.     Hernia: No hernia is present.  Musculoskeletal:        General: No swelling.     Cervical back: Full passive range of motion without pain, normal range of motion and neck supple. No spinous process tenderness or muscular tenderness. Normal range of motion.     Thoracic back: Tenderness present.     Lumbar back: Tenderness present. Positive left straight leg raise test (at 60 degrees). Negative right straight leg raise test.     Right lower leg: No edema.     Left lower leg: No edema.  Skin:    General: Skin is warm and dry.     Capillary Refill: Capillary refill takes less than 2 seconds.     Findings: No ecchymosis, erythema, rash or wound.  Neurological:     General: No focal deficit present.     Mental Status: She is alert and oriented to person, place, and time.     GCS: GCS eye subscore is 4. GCS verbal subscore is 5. GCS motor subscore is 6.     Cranial Nerves: Cranial nerves 2-12 are intact.     Sensory: Sensation is intact.     Motor: Motor function is intact.     Coordination: Coordination is intact.  Psychiatric:        Attention and Perception: Attention normal.        Mood and Affect: Mood normal.        Speech: Speech normal.        Behavior: Behavior normal.     ED Results / Procedures / Treatments   Labs (all labs ordered are listed, but only abnormal results are displayed) Labs Reviewed - No data to display  EKG None  Radiology DG Thoracic Spine 2 View  Result Date: 08/01/2022 CLINICAL DATA:  Back pain after fall EXAM: THORACIC SPINE 2 VIEWS; LUMBAR SPINE - COMPLETE 4 VIEW COMPARISON:  Thoracic  radiograph 07/05/2022 FINDINGS: Thoracic spine: Mild scoliosis and exaggerated kyphosis. Diffuse degenerative endplate spurring with multilevel mid to lower thoracic ankylosis. No evidence of fracture or traumatic malalignment. Maintained posterior mediastinal fat planes. Lumbar spine: Transitional lumbosacral vertebra numbered S1. Lumbar spine degeneration especially affecting lower facets with L4-5  anterolisthesis. No acute fracture or incidental bone lesion. Subjective generalized osteopenia. IMPRESSION: Thoracic spine: No acute or interval finding.  Generalized spondylosis. Lumbar spine: No acute finding.  Facet degeneration with L4-5 anterolisthesis. Electronically Signed   By: Jorje Guild M.D.   On: 08/01/2022 06:30   DG Lumbar Spine Complete  Result Date: 08/01/2022 CLINICAL DATA:  Back pain after fall EXAM: THORACIC SPINE 2 VIEWS; LUMBAR SPINE - COMPLETE 4 VIEW COMPARISON:  Thoracic radiograph 07/05/2022 FINDINGS: Thoracic spine: Mild scoliosis and exaggerated kyphosis. Diffuse degenerative endplate spurring with multilevel mid to lower thoracic ankylosis. No evidence of fracture or traumatic malalignment. Maintained posterior mediastinal fat planes. Lumbar spine: Transitional lumbosacral vertebra numbered S1. Lumbar spine degeneration especially affecting lower facets with L4-5 anterolisthesis. No acute fracture or incidental bone lesion. Subjective generalized osteopenia. IMPRESSION: Thoracic spine: No acute or interval finding.  Generalized spondylosis. Lumbar spine: No acute finding.  Facet degeneration with L4-5 anterolisthesis. Electronically Signed   By: Jorje Guild M.D.   On: 08/01/2022 06:30   DG Shoulder Left  Result Date: 08/01/2022 CLINICAL DATA:  Left shoulder and back pain after fall EXAM: LEFT SHOULDER - 2+ VIEW COMPARISON:  04/27/2022 FINDINGS: Reverse glenohumeral arthroplasty. The prosthesis is well seated and located with stable scalloping at the lower glenoid.  Generalized osteopenia. No evidence of acute fracture. IMPRESSION: Unchanged left shoulder arthroplasty.  No acute finding. Electronically Signed   By: Jorje Guild M.D.   On: 08/01/2022 06:28    Procedures Procedures    Medications Ordered in ED Medications - No data to display  ED Course/ Medical Decision Making/ A&P                           Medical Decision Making Amount and/or Complexity of Data Reviewed Radiology: ordered.   Patient with persistent left-sided back pain after a fall in September.  Patient now developing fairly significant radiculopathy on the left side.  X-rays are negative.  Will perform MRI to further evaluate.  Signed out to oncoming ER physician.  Patient has follow-up with her orthopedic surgeon if MRI is not required immediate intervention.        Final Clinical Impression(s) / ED Diagnoses Final diagnoses:  Sciatica of left side    Rx / DC Orders ED Discharge Orders     None         Prentis Langdon, Gwenyth Allegra, MD 08/01/22 [phone]

## 2022-08-02 ENCOUNTER — Encounter (HOSPITAL_COMMUNITY): Payer: Self-pay

## 2022-08-02 ENCOUNTER — Other Ambulatory Visit (HOSPITAL_COMMUNITY): Payer: Self-pay

## 2022-08-03 ENCOUNTER — Other Ambulatory Visit (HOSPITAL_COMMUNITY): Payer: Self-pay

## 2022-08-03 ENCOUNTER — Encounter (HOSPITAL_COMMUNITY): Payer: Self-pay

## 2022-08-08 ENCOUNTER — Other Ambulatory Visit (HOSPITAL_COMMUNITY): Payer: Self-pay

## 2022-08-08 DIAGNOSIS — Z96652 Presence of left artificial knee joint: Secondary | ICD-10-CM | POA: Diagnosis not present

## 2022-08-08 MED ORDER — METHOCARBAMOL 750 MG PO TABS
750.0000 mg | ORAL_TABLET | Freq: Three times a day (TID) | ORAL | 0 refills | Status: DC
  Filled 2022-08-08 (×2): qty 40, 14d supply, fill #0

## 2022-08-08 MED ORDER — MELOXICAM 7.5 MG PO TABS
7.5000 mg | ORAL_TABLET | Freq: Every day | ORAL | 0 refills | Status: DC
  Filled 2022-08-08 (×2): qty 30, 30d supply, fill #0

## 2022-08-08 MED ORDER — TRAMADOL HCL 50 MG PO TABS
50.0000 mg | ORAL_TABLET | Freq: Four times a day (QID) | ORAL | 0 refills | Status: DC
  Filled 2022-08-08 (×2): qty 30, 8d supply, fill #0

## 2022-08-09 ENCOUNTER — Other Ambulatory Visit: Payer: Self-pay

## 2022-09-04 DIAGNOSIS — G8929 Other chronic pain: Secondary | ICD-10-CM | POA: Diagnosis not present

## 2022-09-04 DIAGNOSIS — M5442 Lumbago with sciatica, left side: Secondary | ICD-10-CM | POA: Diagnosis not present

## 2022-09-04 DIAGNOSIS — I10 Essential (primary) hypertension: Secondary | ICD-10-CM | POA: Diagnosis not present

## 2022-09-05 ENCOUNTER — Other Ambulatory Visit (HOSPITAL_COMMUNITY): Payer: Self-pay

## 2022-09-05 ENCOUNTER — Other Ambulatory Visit: Payer: Self-pay

## 2022-09-05 MED ORDER — METHOCARBAMOL 750 MG PO TABS
750.0000 mg | ORAL_TABLET | Freq: Four times a day (QID) | ORAL | 2 refills | Status: DC | PRN
  Filled 2022-09-05 – 2022-09-12 (×3): qty 28, 7d supply, fill #0
  Filled 2022-11-19: qty 28, 7d supply, fill #1

## 2022-09-05 MED ORDER — MELOXICAM 7.5 MG PO TABS
7.5000 mg | ORAL_TABLET | Freq: Every day | ORAL | 2 refills | Status: DC
Start: 2022-09-04 — End: 2024-04-13
  Filled 2022-09-05 – 2022-09-12 (×3): qty 30, 30d supply, fill #0

## 2022-09-05 MED ORDER — TRAMADOL HCL 50 MG PO TABS
50.0000 mg | ORAL_TABLET | Freq: Two times a day (BID) | ORAL | 2 refills | Status: DC | PRN
  Filled 2022-09-05 – 2022-09-12 (×3): qty 30, 15d supply, fill #0
  Filled 2022-11-19: qty 14, 7d supply, fill #1
  Filled 2022-12-26: qty 14, 7d supply, fill #2

## 2022-09-06 ENCOUNTER — Other Ambulatory Visit (HOSPITAL_COMMUNITY): Payer: Self-pay

## 2022-09-08 ENCOUNTER — Other Ambulatory Visit (HOSPITAL_COMMUNITY): Payer: Self-pay

## 2022-09-11 ENCOUNTER — Other Ambulatory Visit: Payer: Self-pay

## 2022-09-11 ENCOUNTER — Other Ambulatory Visit (HOSPITAL_COMMUNITY): Payer: Self-pay

## 2022-09-11 ENCOUNTER — Encounter (HOSPITAL_COMMUNITY): Payer: Self-pay

## 2022-09-12 ENCOUNTER — Other Ambulatory Visit (HOSPITAL_COMMUNITY): Payer: Self-pay

## 2022-09-19 ENCOUNTER — Other Ambulatory Visit: Payer: Self-pay

## 2022-09-19 ENCOUNTER — Other Ambulatory Visit (HOSPITAL_COMMUNITY): Payer: Self-pay

## 2022-09-19 DIAGNOSIS — Z1231 Encounter for screening mammogram for malignant neoplasm of breast: Secondary | ICD-10-CM | POA: Diagnosis not present

## 2022-09-20 ENCOUNTER — Other Ambulatory Visit: Payer: Self-pay

## 2022-09-20 ENCOUNTER — Other Ambulatory Visit (HOSPITAL_COMMUNITY): Payer: Self-pay

## 2022-09-21 ENCOUNTER — Other Ambulatory Visit (HOSPITAL_COMMUNITY): Payer: Self-pay

## 2022-09-21 DIAGNOSIS — M47896 Other spondylosis, lumbar region: Secondary | ICD-10-CM | POA: Diagnosis not present

## 2022-09-21 DIAGNOSIS — M5416 Radiculopathy, lumbar region: Secondary | ICD-10-CM | POA: Insufficient documentation

## 2022-09-27 DIAGNOSIS — M5416 Radiculopathy, lumbar region: Secondary | ICD-10-CM | POA: Diagnosis not present

## 2022-10-02 DIAGNOSIS — E119 Type 2 diabetes mellitus without complications: Secondary | ICD-10-CM | POA: Diagnosis not present

## 2022-10-10 ENCOUNTER — Other Ambulatory Visit (HOSPITAL_COMMUNITY): Payer: Self-pay

## 2022-10-13 ENCOUNTER — Other Ambulatory Visit (HOSPITAL_COMMUNITY): Payer: Self-pay

## 2022-10-18 ENCOUNTER — Other Ambulatory Visit: Payer: Self-pay

## 2022-10-20 ENCOUNTER — Other Ambulatory Visit (HOSPITAL_COMMUNITY): Payer: Self-pay

## 2022-11-07 ENCOUNTER — Other Ambulatory Visit (HOSPITAL_COMMUNITY): Payer: Self-pay

## 2022-11-07 DIAGNOSIS — I10 Essential (primary) hypertension: Secondary | ICD-10-CM | POA: Diagnosis not present

## 2022-11-07 DIAGNOSIS — E1169 Type 2 diabetes mellitus with other specified complication: Secondary | ICD-10-CM | POA: Diagnosis not present

## 2022-11-07 DIAGNOSIS — R011 Cardiac murmur, unspecified: Secondary | ICD-10-CM | POA: Diagnosis not present

## 2022-11-07 DIAGNOSIS — E7800 Pure hypercholesterolemia, unspecified: Secondary | ICD-10-CM | POA: Diagnosis not present

## 2022-11-07 MED ORDER — METFORMIN HCL 500 MG PO TABS
1000.0000 mg | ORAL_TABLET | Freq: Every day | ORAL | 3 refills | Status: DC
  Filled 2022-11-07: qty 180, 90d supply, fill #0

## 2022-11-08 ENCOUNTER — Other Ambulatory Visit (HOSPITAL_COMMUNITY): Payer: Self-pay

## 2022-11-19 ENCOUNTER — Other Ambulatory Visit: Payer: Self-pay

## 2022-11-19 ENCOUNTER — Other Ambulatory Visit (HOSPITAL_COMMUNITY): Payer: Self-pay

## 2022-11-30 DIAGNOSIS — R011 Cardiac murmur, unspecified: Secondary | ICD-10-CM | POA: Diagnosis not present

## 2022-12-05 ENCOUNTER — Other Ambulatory Visit (HOSPITAL_COMMUNITY): Payer: Self-pay

## 2022-12-05 DIAGNOSIS — E8809 Other disorders of plasma-protein metabolism, not elsewhere classified: Secondary | ICD-10-CM | POA: Diagnosis not present

## 2022-12-05 DIAGNOSIS — I10 Essential (primary) hypertension: Secondary | ICD-10-CM | POA: Diagnosis not present

## 2022-12-05 DIAGNOSIS — N390 Urinary tract infection, site not specified: Secondary | ICD-10-CM | POA: Diagnosis not present

## 2022-12-05 DIAGNOSIS — Z885 Allergy status to narcotic agent status: Secondary | ICD-10-CM | POA: Diagnosis not present

## 2022-12-05 DIAGNOSIS — G459 Transient cerebral ischemic attack, unspecified: Secondary | ICD-10-CM | POA: Diagnosis not present

## 2022-12-05 DIAGNOSIS — E871 Hypo-osmolality and hyponatremia: Secondary | ICD-10-CM | POA: Diagnosis not present

## 2022-12-05 DIAGNOSIS — A419 Sepsis, unspecified organism: Secondary | ICD-10-CM | POA: Diagnosis not present

## 2022-12-05 DIAGNOSIS — J8410 Pulmonary fibrosis, unspecified: Secondary | ICD-10-CM | POA: Diagnosis not present

## 2022-12-05 DIAGNOSIS — R471 Dysarthria and anarthria: Secondary | ICD-10-CM | POA: Diagnosis not present

## 2022-12-05 DIAGNOSIS — Z1152 Encounter for screening for COVID-19: Secondary | ICD-10-CM | POA: Diagnosis not present

## 2022-12-05 DIAGNOSIS — R4701 Aphasia: Secondary | ICD-10-CM | POA: Diagnosis not present

## 2022-12-05 DIAGNOSIS — K219 Gastro-esophageal reflux disease without esophagitis: Secondary | ICD-10-CM | POA: Diagnosis not present

## 2022-12-05 DIAGNOSIS — R531 Weakness: Secondary | ICD-10-CM | POA: Diagnosis not present

## 2022-12-05 DIAGNOSIS — I2510 Atherosclerotic heart disease of native coronary artery without angina pectoris: Secondary | ICD-10-CM | POA: Diagnosis not present

## 2022-12-05 DIAGNOSIS — Z880 Allergy status to penicillin: Secondary | ICD-10-CM | POA: Diagnosis not present

## 2022-12-06 ENCOUNTER — Other Ambulatory Visit: Payer: Self-pay

## 2022-12-18 DIAGNOSIS — N9089 Other specified noninflammatory disorders of vulva and perineum: Secondary | ICD-10-CM | POA: Diagnosis not present

## 2022-12-26 ENCOUNTER — Other Ambulatory Visit (HOSPITAL_COMMUNITY): Payer: Self-pay

## 2022-12-26 ENCOUNTER — Other Ambulatory Visit: Payer: Self-pay

## 2022-12-27 ENCOUNTER — Other Ambulatory Visit: Payer: Self-pay

## 2022-12-27 ENCOUNTER — Other Ambulatory Visit (HOSPITAL_COMMUNITY): Payer: Self-pay

## 2022-12-27 MED ORDER — TELMISARTAN-HCTZ 80-12.5 MG PO TABS
1.0000 | ORAL_TABLET | Freq: Every day | ORAL | 1 refills | Status: DC
  Filled 2022-12-27: qty 90, 90d supply, fill #0
  Filled 2023-06-17: qty 90, 90d supply, fill #1

## 2023-01-16 ENCOUNTER — Ambulatory Visit
Admission: EM | Admit: 2023-01-16 | Discharge: 2023-01-16 | Disposition: A | Discharge status: Home / Self Care | Payer: Commercial Managed Care - PPO | Attending: Urgent Care | Admitting: Urgent Care

## 2023-01-16 ENCOUNTER — Ambulatory Visit (INDEPENDENT_AMBULATORY_CARE_PROVIDER_SITE_OTHER): Payer: Commercial Managed Care - PPO

## 2023-01-16 DIAGNOSIS — S52502A Unspecified fracture of the lower end of left radius, initial encounter for closed fracture: Secondary | ICD-10-CM | POA: Diagnosis not present

## 2023-01-16 DIAGNOSIS — M79642 Pain in left hand: Secondary | ICD-10-CM

## 2023-01-16 DIAGNOSIS — S52135A Nondisplaced fracture of neck of left radius, initial encounter for closed fracture: Secondary | ICD-10-CM | POA: Diagnosis not present

## 2023-01-16 DIAGNOSIS — W19XXXA Unspecified fall, initial encounter: Secondary | ICD-10-CM

## 2023-01-16 DIAGNOSIS — M25532 Pain in left wrist: Secondary | ICD-10-CM | POA: Diagnosis not present

## 2023-01-16 MED ORDER — ACETAMINOPHEN 325 MG PO TABS
650.0000 mg | ORAL_TABLET | Freq: Four times a day (QID) | ORAL | 0 refills | Status: AC | PRN
  Filled 2023-01-16: qty 30, 4d supply, fill #0

## 2023-01-16 NOTE — Discharge Instructions (Signed)
Wear the splint at all times. Use the shoulder sling to help carry the weight of the splint. Make sure you keep moving your shoulder. Follow up with your orthopedist as soon as possible for this fracture. Do not use any nonsteroidal anti-inflammatories (NSAIDs) like ibuprofen, Motrin, naproxen, Aleve, etc. which are all available over-the-counter.  Please just use Tylenol at a dose of 650mg  once every 6 hours as needed for your aches, pains.

## 2023-01-16 NOTE — ED Triage Notes (Signed)
Pt reports pain in left hand, left wrist, left arm and chin after she fell on concrete today.

## 2023-01-16 NOTE — ED Provider Notes (Signed)
[facility]  Note:  This document was prepared using Conservation officer, historic buildings and may include unintentional dictation errors.  MRN: [id number] DOB: 04-09-1941  Subjective:   Savannah Lewis is a 82 y.o. female presenting for suffering an accidental fall today while in a parking lot. Patient reports that she was getting out of her car after work. Was trying to buy some items for her dog. Unfortunately she accidentally tripped and fell absorbing most of the impact on her left hand and wrist, forearm.  She has since had pain, swelling.  She also made impact with her chin but to a lesser degree.  No loss conscious, confusion, weakness, numbness or tingling, vision changes, bleeding, wounds.  No current facility-administered medications for this encounter.  Current Outpatient Medications:    amLODipine (NORVASC) 10 MG tablet, Take 0.5 tablets by mouth daily., Disp: , Rfl:    amLODipine (NORVASC) 5 MG tablet, Take 1 tablet (5 mg total) by mouth daily., Disp: 90 tablet, Rfl: 3   ascorbic acid (VITAMIN C) 500 MG tablet, , Disp: , Rfl:    Blood Glucose Monitoring Suppl (FREESTYLE LITE) w/Device KIT, Use as directed to test blood sugar once a day and as needed., Disp: 1 kit, Rfl: 0   Calcium Carb-Cholecalciferol (CALCIUM 600 + D) 600-5 MG-MCG TABS, 1 tablet, Disp: , Rfl:    calcium carbonate (OSCAL) 1500 (600 Ca) MG TABS tablet, Take 600 mg of elemental calcium by mouth daily with breakfast. , Disp: , Rfl:    cephALEXin (KEFLEX) 500 MG capsule, cephalexin 500 mg capsule, Disp: , Rfl:    Cetirizine HCl (ZYRTEC ALLERGY) 10 MG CAPS, as needed., Disp: , Rfl:    Cholecalciferol (VITAMIN D3) 10 MCG (400 UNIT) tablet, as needed., Disp: , Rfl:    furosemide (LASIX) 40 MG tablet, Take 1 tablet by mouth once daily for 3 days, Disp: 3 tablet, Rfl: 0   glucose blood (ACCU-CHEK AVIVA PLUS) test strip, USE TO TEST BLOOD SUGAR ONCE A DAY AND AS NEEDED 90 days, Disp: 100 strip, Rfl:  3   glucose blood (FREESTYLE LITE) test strip, Use as directed to test blood sugar once daily and as needed, Disp: 100 each, Rfl: 3   guaifenesin (HUMIBID E) 400 MG TABS tablet, Take 1 tablet by mouth 3 times daily as needed for chest congestion and cough, Disp: 21 tablet, Rfl: 0   ipratropium (ATROVENT) 0.06 % nasal spray, Place 2 sprays into both nostrils 4 (four) times daily as needed for nasal congestion, runny nose, Disp: 15 mL, Rfl: 0   Lancets (FREESTYLE) lancets, Use as directed to test blood sugar once a day and as needed, Disp: 100 each, Rfl: 3   lidocaine (XYLOCAINE) 5 % ointment, Apply 1 Application topically as needed., Disp: 35.44 g, Rfl: 0   meloxicam (MOBIC) 15 MG tablet, Take 1 tablet (15 mg total) by mouth daily., Disp: 30 tablet, Rfl: 2   meloxicam (MOBIC) 7.5 MG tablet, Take 1 tablet (7.5 mg total) by mouth daily., Disp: 30 tablet, Rfl: 2   metFORMIN (GLUCOPHAGE) 500 MG tablet, Take 2 tablets by mouth twice a day., Disp: 360 tablet, Rfl: 3   metFORMIN (GLUCOPHAGE) 500 MG tablet, Take 2 tablets (1,000 mg total) by mouth daily., Disp: 180 tablet, Rfl: 3   methocarbamol (ROBAXIN) 750 MG tablet, Take 1 tablet (750 mg total) by mouth every 6 (six) hours as needed for 7 days, Disp: 28 tablet, Rfl: 2   methocarbamol (ROBAXIN-750) 750  MG tablet, Take 1 tablet (750 mg total) by mouth 3 (three) times daily., Disp: 40 tablet, Rfl: 0   Multiple Vitamins-Minerals (MULTIVITAMIN WITH MINERALS) tablet, Take 1 tablet by mouth daily., Disp: , Rfl:    telmisartan-hydrochlorothiazide (MICARDIS HCT) 80-12.5 MG tablet, Take 1 tablet by mouth daily., Disp: 90 tablet, Rfl: 1   tiZANidine (ZANAFLEX) 4 MG tablet, Take 1 tablet (4 mg total) by mouth every 6 (six) hours as needed for muscle spasms., Disp: 30 tablet, Rfl: 0   traMADol (ULTRAM) 50 MG tablet, Take 1 tablet (50 mg total) by mouth 2 (two) times daily as needed., Disp: 30 tablet, Rfl: 2   Allergies  Allergen Reactions   Esomeprazole Magnesium  Other (See Comments)    NDC Code:[id number]  NDC OZHY:[id number]  NDC BMWU:[id number] NDC ZDGU:[id number]    Past Medical History:  Diagnosis Date   Cervical spondylosis    DDD (degenerative disc disease), cervical    De Quervain's tenosynovitis, left    WRIST   Diabetic retinopathy, nonproliferative (HCC)    Diverticulosis    DJD (degenerative joint disease)    left knee   Heart murmur    told a last physical a slight heart murmurper patient   History of adenomatous polyp of colon    2009   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)   History of Bell's palsy    2003  RIGHT SIDE-  resolved   Hypertension    Thoracic aortic aneurysm (HCC)    followed by Dr Kathlee Nations Trigt- LOV- 02/2013 in [facility]    Thoracic ascending aortic aneurysm (HCC)    4.1 CM  STABLE SINCE 2004 PER DR VANTRIGHT NOTE 02/2013   TMJ (temporomandibular joint syndrome)    Type 2 diabetes mellitus (HCC)    Wears glasses      Past Surgical History:  Procedure Laterality Date   ABDOMINAL HYSTERECTOMY  1995   W/ UNILATERAL SALPINGOOPHORECTOMY   COLONOSCOPY N/A 04/14/2013   Procedure: COLONOSCOPY;  Surgeon: Charolett Bumpers, MD;  Location: [facility];  Service: Endoscopy;  Laterality: N/A;   DORSAL COMPARTMENT RELEASE Left 09/24/2013   Procedure: LEFT WRIST FIRST DORSAL COMPARTMENT TENOSYNOVITIS;  Surgeon: Sharma Covert, MD;  Location: [facility];  Service: Orthopedics;  Laterality: Left;  ANESTHESIA: LOCAL/IV SEDATION   JOINT REPLACEMENT     KNEE ARTHROSCOPY Bilateral RIGHT 2002 & 1994/   LEFT  1995   LAPAROSCOPY ASSISTED  RIGHT COLECTOMY  03-22-2005   CECUM POLYP   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/2018   Procedure: REVERSE LEFT SHOULDER ARTHROPLASTY;  Surgeon: Beverely Low, MD;  Location: [facility] OR;  Service: Orthopedics;  Laterality: Left;   ROTATOR CUFF REPAIR Right 08-31-2008   TOTAL KNEE ARTHROPLASTY Right 07-01-2009   TOTAL KNEE ARTHROPLASTY Left 08/31/2014   Procedure:  LEFT TOTAL KNEE ARTHROPLASTY;  Surgeon: Eugenia Mcalpine, MD;  Location: [facility];  Service: Orthopedics;  Laterality: Left;    Family History  Problem Relation Age of Onset   Cancer Mother    Diabetes Father     Social History   Tobacco Use   Smoking status: Former    Years: 2    Types: Cigarettes    Quit date: 08/21/1959    Years since quitting: 63.4   Smokeless tobacco: Never  Vaping Use   Vaping Use: Never used  Substance Use Topics   Alcohol use: No   Drug use: No    ROS   Objective:   Vitals: BP (!) 191/75 (BP Location:  Right Arm)   Pulse 92   Temp 98.6 F (37 C) (Oral)   Resp 18   SpO2 97%   Physical Exam Constitutional:      General: She is not in acute distress.    Appearance: Normal appearance. She is well-developed and normal weight. She is not ill-appearing, toxic-appearing or diaphoretic.  HENT:     Head: Normocephalic and atraumatic.     Right Ear: Tympanic membrane, ear canal and external ear normal. No drainage or tenderness. No middle ear effusion. There is no impacted cerumen. Tympanic membrane is not erythematous or bulging.     Left Ear: Tympanic membrane, ear canal and external ear normal. No drainage or tenderness.  No middle ear effusion. There is no impacted cerumen. Tympanic membrane is not erythematous or bulging.     Nose: Nose normal. No congestion or rhinorrhea.     Mouth/Throat:     Mouth: Mucous membranes are moist. No oral lesions.     Pharynx: No pharyngeal swelling, oropharyngeal exudate, posterior oropharyngeal erythema or uvula swelling.     Tonsils: No tonsillar exudate or tonsillar abscesses.  Eyes:     General: No scleral icterus.       Right eye: No discharge.        Left eye: No discharge.     Extraocular Movements: Extraocular movements intact.     Right eye: Normal extraocular motion.     Left eye: Normal extraocular motion.     Conjunctiva/sclera: Conjunctivae normal.  Cardiovascular:     Rate and Rhythm: Normal rate.   Pulmonary:     Effort: Pulmonary effort is normal.  Musculoskeletal:     Left shoulder: No swelling, deformity, effusion, laceration, tenderness, bony tenderness or crepitus. Normal range of motion. Normal strength.     Left elbow: No swelling, deformity, effusion or lacerations. Normal range of motion. No tenderness.     Left forearm: No swelling, edema, deformity, lacerations, tenderness or bony tenderness.     Left wrist: Swelling, tenderness and bony tenderness present. No deformity, effusion, lacerations, snuff box tenderness or crepitus. Decreased range of motion.     Left hand: Tenderness and bony tenderness (proximal metacarpals, worse over ulnar aspect) present. No swelling, deformity or lacerations. Normal range of motion. Normal strength. Normal sensation. Normal capillary refill.     Cervical back: Normal range of motion and neck supple.  Lymphadenopathy:     Cervical: No cervical adenopathy.  Skin:    General: Skin is warm and dry.  Neurological:     General: No focal deficit present.     Mental Status: She is alert and oriented to person, place, and time.     Cranial Nerves: No cranial nerve deficit.     Motor: No weakness.     Coordination: Coordination normal.     Gait: Gait normal.  Psychiatric:        Mood and Affect: Mood normal.        Behavior: Behavior normal.        Thought Content: Thought content normal.        Judgment: Judgment normal.    DG Wrist Complete Left  Result Date: 01/16/2023 CLINICAL DATA:  Wrist pain fall EXAM: LEFT WRIST - COMPLETE 3+ VIEW COMPARISON:  None Available. FINDINGS: No malalignment. Acute nondisplaced distal radius fracture best seen on lateral view. Positive for soft tissue swelling. Mild arthritis at the first [facility] joint. IMPRESSION: Acute nondisplaced distal radius fracture. Electronically Signed   By: Jasmine Pang  M.D.   On: 01/16/2023 16:42    Patient placed into a sugar-tong splint for the left arm with the wrist in slight  tension.  She was provided with a left shoulder sling.  Assessment and Plan :   PDMP not reviewed this encounter.  1. Nondisplaced fracture of distal end of left radius   2. Left hand pain   3. Left wrist pain   4. Accidental fall, initial encounter    Patient is established with her orthopedist at [facility].  She would like to follow-up there.  I advised that she contact them for an appointment today.  In the meantime, wear the splint at all times.  Use the shoulder sling to bear the weight of the splint.  Advised that she continue to use her hand, fingers and left shoulder.  Use Tylenol for pain relief.  Will avoid narcotic use so as to avoid side effects of dizziness, increased risk for falls.  Counseled patient on potential for adverse effects with medications prescribed/recommended today, ER and return-to-clinic precautions discussed, patient verbalized understanding.    Wallis Bamberg, [place] 01/16/23 1857

## 2023-01-17 ENCOUNTER — Other Ambulatory Visit: Payer: Self-pay

## 2023-01-17 ENCOUNTER — Other Ambulatory Visit (HOSPITAL_COMMUNITY): Payer: Self-pay

## 2023-01-21 DIAGNOSIS — S52509A Unspecified fracture of the lower end of unspecified radius, initial encounter for closed fracture: Secondary | ICD-10-CM | POA: Insufficient documentation

## 2023-01-21 DIAGNOSIS — M65342 Trigger finger, left ring finger: Secondary | ICD-10-CM | POA: Diagnosis not present

## 2023-01-21 DIAGNOSIS — M25532 Pain in left wrist: Secondary | ICD-10-CM | POA: Diagnosis not present

## 2023-01-21 DIAGNOSIS — S52502A Unspecified fracture of the lower end of left radius, initial encounter for closed fracture: Secondary | ICD-10-CM | POA: Diagnosis not present

## 2023-01-30 DIAGNOSIS — M19011 Primary osteoarthritis, right shoulder: Secondary | ICD-10-CM | POA: Insufficient documentation

## 2023-01-30 DIAGNOSIS — M25512 Pain in left shoulder: Secondary | ICD-10-CM | POA: Diagnosis not present

## 2023-01-30 DIAGNOSIS — L309 Dermatitis, unspecified: Secondary | ICD-10-CM | POA: Diagnosis not present

## 2023-01-30 DIAGNOSIS — I10 Essential (primary) hypertension: Secondary | ICD-10-CM | POA: Diagnosis not present

## 2023-01-30 DIAGNOSIS — S52502A Unspecified fracture of the lower end of left radius, initial encounter for closed fracture: Secondary | ICD-10-CM | POA: Diagnosis not present

## 2023-01-31 ENCOUNTER — Other Ambulatory Visit (HOSPITAL_COMMUNITY): Payer: Self-pay

## 2023-01-31 MED ORDER — TRIAMCINOLONE ACETONIDE 0.1 % EX CREA
1.0000 | TOPICAL_CREAM | Freq: Every day | CUTANEOUS | 0 refills | Status: DC
  Filled 2023-01-31: qty 15, 15d supply, fill #0

## 2023-01-31 MED ORDER — TELMISARTAN-HCTZ 80-25 MG PO TABS
1.0000 | ORAL_TABLET | Freq: Every day | ORAL | 2 refills | Status: DC
  Filled 2023-01-31: qty 30, 30d supply, fill #0
  Filled 2023-02-26: qty 30, 30d supply, fill #1
  Filled 2023-03-29: qty 30, 30d supply, fill #2

## 2023-02-04 DIAGNOSIS — M25532 Pain in left wrist: Secondary | ICD-10-CM | POA: Diagnosis not present

## 2023-02-04 DIAGNOSIS — M65342 Trigger finger, left ring finger: Secondary | ICD-10-CM | POA: Diagnosis not present

## 2023-02-04 DIAGNOSIS — S52502A Unspecified fracture of the lower end of left radius, initial encounter for closed fracture: Secondary | ICD-10-CM | POA: Diagnosis not present

## 2023-02-20 ENCOUNTER — Other Ambulatory Visit: Payer: Self-pay

## 2023-02-20 ENCOUNTER — Other Ambulatory Visit (HOSPITAL_COMMUNITY): Payer: Self-pay

## 2023-02-26 ENCOUNTER — Other Ambulatory Visit (HOSPITAL_COMMUNITY): Payer: Self-pay

## 2023-03-01 DIAGNOSIS — I10 Essential (primary) hypertension: Secondary | ICD-10-CM | POA: Diagnosis not present

## 2023-03-19 ENCOUNTER — Other Ambulatory Visit: Payer: Self-pay

## 2023-03-24 DIAGNOSIS — E119 Type 2 diabetes mellitus without complications: Secondary | ICD-10-CM | POA: Diagnosis not present

## 2023-03-24 DIAGNOSIS — H00024 Hordeolum internum left upper eyelid: Secondary | ICD-10-CM | POA: Diagnosis not present

## 2023-03-25 ENCOUNTER — Other Ambulatory Visit (HOSPITAL_COMMUNITY): Payer: Self-pay

## 2023-03-25 MED ORDER — DOXYCYCLINE HYCLATE 100 MG PO TABS
100.0000 mg | ORAL_TABLET | Freq: Two times a day (BID) | ORAL | 0 refills | Status: DC
  Filled 2023-03-25: qty 14, 7d supply, fill #0

## 2023-03-29 ENCOUNTER — Other Ambulatory Visit (HOSPITAL_COMMUNITY): Payer: Self-pay

## 2023-04-15 ENCOUNTER — Other Ambulatory Visit (HOSPITAL_COMMUNITY): Payer: Self-pay

## 2023-04-19 ENCOUNTER — Other Ambulatory Visit (HOSPITAL_COMMUNITY): Payer: Self-pay

## 2023-04-19 DIAGNOSIS — I700 Atherosclerosis of aorta: Secondary | ICD-10-CM | POA: Diagnosis not present

## 2023-04-19 DIAGNOSIS — E1169 Type 2 diabetes mellitus with other specified complication: Secondary | ICD-10-CM | POA: Diagnosis not present

## 2023-04-19 DIAGNOSIS — I7121 Aneurysm of the ascending aorta, without rupture: Secondary | ICD-10-CM | POA: Diagnosis not present

## 2023-04-19 DIAGNOSIS — D509 Iron deficiency anemia, unspecified: Secondary | ICD-10-CM | POA: Diagnosis not present

## 2023-04-19 DIAGNOSIS — E7800 Pure hypercholesterolemia, unspecified: Secondary | ICD-10-CM | POA: Diagnosis not present

## 2023-04-19 DIAGNOSIS — M8589 Other specified disorders of bone density and structure, multiple sites: Secondary | ICD-10-CM | POA: Diagnosis not present

## 2023-04-19 DIAGNOSIS — D489 Neoplasm of uncertain behavior, unspecified: Secondary | ICD-10-CM | POA: Diagnosis not present

## 2023-04-19 DIAGNOSIS — Z0000 Encounter for general adult medical examination without abnormal findings: Secondary | ICD-10-CM | POA: Diagnosis not present

## 2023-04-19 DIAGNOSIS — I10 Essential (primary) hypertension: Secondary | ICD-10-CM | POA: Diagnosis not present

## 2023-04-19 MED ORDER — TRAMADOL HCL 50 MG PO TABS
50.0000 mg | ORAL_TABLET | Freq: Two times a day (BID) | ORAL | 0 refills | Status: AC | PRN
  Filled 2023-04-19: qty 30, 15d supply, fill #0

## 2023-04-20 ENCOUNTER — Other Ambulatory Visit (HOSPITAL_BASED_OUTPATIENT_CLINIC_OR_DEPARTMENT_OTHER): Payer: Self-pay

## 2023-04-23 ENCOUNTER — Other Ambulatory Visit: Payer: Self-pay

## 2023-04-25 ENCOUNTER — Other Ambulatory Visit: Payer: Self-pay | Admitting: Internal Medicine

## 2023-04-25 ENCOUNTER — Encounter: Payer: Self-pay | Admitting: Internal Medicine

## 2023-04-25 DIAGNOSIS — M8589 Other specified disorders of bone density and structure, multiple sites: Secondary | ICD-10-CM

## 2023-05-08 DIAGNOSIS — D61818 Other pancytopenia: Secondary | ICD-10-CM | POA: Diagnosis not present

## 2023-05-08 DIAGNOSIS — N1411 Contrast-induced nephropathy: Secondary | ICD-10-CM | POA: Diagnosis not present

## 2023-06-06 DIAGNOSIS — H9313 Tinnitus, bilateral: Secondary | ICD-10-CM | POA: Diagnosis not present

## 2023-06-06 DIAGNOSIS — M26609 Unspecified temporomandibular joint disorder, unspecified side: Secondary | ICD-10-CM | POA: Diagnosis not present

## 2023-06-06 DIAGNOSIS — H903 Sensorineural hearing loss, bilateral: Secondary | ICD-10-CM | POA: Diagnosis not present

## 2023-06-17 ENCOUNTER — Other Ambulatory Visit (HOSPITAL_COMMUNITY): Payer: Self-pay

## 2023-06-17 ENCOUNTER — Other Ambulatory Visit: Payer: Self-pay

## 2023-06-17 MED ORDER — AMLODIPINE BESYLATE 5 MG PO TABS
5.0000 mg | ORAL_TABLET | Freq: Every day | ORAL | 3 refills | Status: DC
  Filled 2023-06-17: qty 90, 90d supply, fill #0
  Filled 2023-09-12: qty 90, 90d supply, fill #1
  Filled 2023-12-10: qty 90, 90d supply, fill #2
  Filled 2024-03-04: qty 90, 90d supply, fill #3

## 2023-06-19 DIAGNOSIS — N8110 Cystocele, unspecified: Secondary | ICD-10-CM | POA: Diagnosis not present

## 2023-06-19 DIAGNOSIS — I10 Essential (primary) hypertension: Secondary | ICD-10-CM | POA: Diagnosis not present

## 2023-06-19 DIAGNOSIS — Z01419 Encounter for gynecological examination (general) (routine) without abnormal findings: Secondary | ICD-10-CM | POA: Diagnosis not present

## 2023-07-09 ENCOUNTER — Other Ambulatory Visit (HOSPITAL_COMMUNITY): Payer: Self-pay

## 2023-07-16 DIAGNOSIS — M25511 Pain in right shoulder: Secondary | ICD-10-CM | POA: Diagnosis not present

## 2023-07-30 DIAGNOSIS — Z860101 Personal history of adenomatous and serrated colon polyps: Secondary | ICD-10-CM | POA: Diagnosis not present

## 2023-07-30 DIAGNOSIS — Z09 Encounter for follow-up examination after completed treatment for conditions other than malignant neoplasm: Secondary | ICD-10-CM | POA: Diagnosis not present

## 2023-07-30 DIAGNOSIS — Z800 Family history of malignant neoplasm of digestive organs: Secondary | ICD-10-CM | POA: Diagnosis not present

## 2023-08-03 ENCOUNTER — Other Ambulatory Visit (HOSPITAL_COMMUNITY): Payer: Self-pay

## 2023-08-03 MED ORDER — PEG-3350/ELECTROLYTES 236 G PO SOLR
ORAL | 0 refills | Status: DC
  Filled 2023-08-03 (×2): qty 4000, 1d supply, fill #0

## 2023-08-22 DIAGNOSIS — Q438 Other specified congenital malformations of intestine: Secondary | ICD-10-CM | POA: Diagnosis not present

## 2023-08-22 DIAGNOSIS — Z860101 Personal history of adenomatous and serrated colon polyps: Secondary | ICD-10-CM | POA: Diagnosis not present

## 2023-08-22 DIAGNOSIS — K648 Other hemorrhoids: Secondary | ICD-10-CM | POA: Diagnosis not present

## 2023-08-22 DIAGNOSIS — K635 Polyp of colon: Secondary | ICD-10-CM | POA: Diagnosis not present

## 2023-08-22 DIAGNOSIS — K6389 Other specified diseases of intestine: Secondary | ICD-10-CM | POA: Diagnosis not present

## 2023-08-22 DIAGNOSIS — Z09 Encounter for follow-up examination after completed treatment for conditions other than malignant neoplasm: Secondary | ICD-10-CM | POA: Diagnosis not present

## 2023-08-22 DIAGNOSIS — K5730 Diverticulosis of large intestine without perforation or abscess without bleeding: Secondary | ICD-10-CM | POA: Diagnosis not present

## 2023-09-06 ENCOUNTER — Other Ambulatory Visit (HOSPITAL_COMMUNITY): Payer: Self-pay

## 2023-09-06 ENCOUNTER — Ambulatory Visit
Admission: EM | Admit: 2023-09-06 | Discharge: 2023-09-06 | Disposition: A | Discharge status: Home / Self Care | Payer: Commercial Managed Care - PPO | Attending: Family Medicine | Admitting: Family Medicine

## 2023-09-06 DIAGNOSIS — B349 Viral infection, unspecified: Secondary | ICD-10-CM | POA: Insufficient documentation

## 2023-09-06 MED ORDER — PROMETHAZINE-DM 6.25-15 MG/5ML PO SYRP
2.5000 mL | ORAL_SOLUTION | Freq: Three times a day (TID) | ORAL | 0 refills | Status: AC | PRN
  Filled 2023-09-06: qty 100, 14d supply, fill #0

## 2023-09-06 MED ORDER — CETIRIZINE HCL 10 MG PO TABS
10.0000 mg | ORAL_TABLET | Freq: Every day | ORAL | 0 refills | Status: AC
  Filled 2023-09-06: qty 30, 30d supply, fill #0

## 2023-09-06 MED ORDER — BENZONATATE 100 MG PO CAPS
100.0000 mg | ORAL_CAPSULE | Freq: Three times a day (TID) | ORAL | 0 refills | Status: AC | PRN
  Filled 2023-09-06: qty 30, 10d supply, fill #0

## 2023-09-06 NOTE — ED Provider Notes (Signed)
[facility] - [facility]RG[facility]  Note:  This document was prepared using Conservation officer, historic buildings and may include unintentional dictation errors.  MRN: [id number] DOB: [birth year]  Subjective:   Savannah Lewis is a 83 y.o. female presenting for 1 day history of acute onset congestion, drainage, coughing.  No chest pain, shortness of breath or wheezing.  No history of asthma.  Patient is well-controlled diabetes.  No history of kidney disease.  No smoking of any kind including cigarettes, cigars, vaping, marijuana use.  Patient works for [employer].  No known exposures.   No current facility-administered medications for this encounter.  Current Outpatient Medications:    acetaminophen (TYLENOL) 325 MG tablet, Take 2 tablets (650 mg total) by mouth every 6 (six) hours as needed for moderate pain., Disp: 30 tablet, Rfl: 0   amLODipine (NORVASC) 10 MG tablet, Take 0.5 tablets by mouth daily., Disp: , Rfl:    amLODipine (NORVASC) 5 MG tablet, Take 1 tablet (5 mg total) by mouth daily., Disp: 90 tablet, Rfl: 3   ascorbic acid (VITAMIN C) 500 MG tablet, , Disp: , Rfl:    Blood Glucose Monitoring Suppl (FREESTYLE LITE) w/Device KIT, Use as directed to test blood sugar once a day and as needed., Disp: 1 kit, Rfl: 0   Calcium Carb-Cholecalciferol (CALCIUM 600 + D) 600-5 MG-MCG TABS, 1 tablet, Disp: , Rfl:    calcium carbonate (OSCAL) 1500 (600 Ca) MG TABS tablet, Take 600 mg of elemental calcium by mouth daily with breakfast. , Disp: , Rfl:    cephALEXin (KEFLEX) 500 MG capsule, cephalexin 500 mg capsule, Disp: , Rfl:    Cetirizine HCl (ZYRTEC ALLERGY) 10 MG CAPS, as needed., Disp: , Rfl:    Cholecalciferol (VITAMIN D3) 10 MCG (400 UNIT) tablet, as needed., Disp: , Rfl:    doxycycline (VIBRA-TABS) 100 MG tablet, Take 1 tablet (100 mg total) by mouth every 12 (twelve) hours., Disp: 14 tablet, Rfl: 0   furosemide (LASIX) 40 MG tablet, Take 1 tablet by mouth once daily for 3 days, Disp: 3  tablet, Rfl: 0   glucose blood (ACCU-CHEK AVIVA PLUS) test strip, USE TO TEST BLOOD SUGAR ONCE A DAY AND AS NEEDED 90 days, Disp: 100 strip, Rfl: 3   glucose blood (FREESTYLE LITE) test strip, Use as directed to test blood sugar once daily and as needed, Disp: 100 each, Rfl: 3   guaifenesin (HUMIBID E) 400 MG TABS tablet, Take 1 tablet by mouth 3 times daily as needed for chest congestion and cough, Disp: 21 tablet, Rfl: 0   ipratropium (ATROVENT) 0.06 % nasal spray, Place 2 sprays into both nostrils 4 (four) times daily as needed for nasal congestion, runny nose, Disp: 15 mL, Rfl: 0   Lancets (FREESTYLE) lancets, Use as directed to test blood sugar once a day and as needed, Disp: 100 each, Rfl: 3   lidocaine (XYLOCAINE) 5 % ointment, Apply 1 Application topically as needed., Disp: 35.44 g, Rfl: 0   meloxicam (MOBIC) 15 MG tablet, Take 1 tablet (15 mg total) by mouth daily., Disp: 30 tablet, Rfl: 2   meloxicam (MOBIC) 7.5 MG tablet, Take 1 tablet (7.5 mg total) by mouth daily., Disp: 30 tablet, Rfl: 2   metFORMIN (GLUCOPHAGE) 500 MG tablet, Take 2 tablets by mouth twice a day., Disp: 360 tablet, Rfl: 3   metFORMIN (GLUCOPHAGE) 500 MG tablet, Take 2 tablets (1,000 mg total) by mouth daily., Disp: 180 tablet, Rfl: 3   methocarbamol (ROBAXIN) 750 MG  tablet, Take 1 tablet (750 mg total) by mouth every 6 (six) hours as needed for 7 days, Disp: 28 tablet, Rfl: 2   methocarbamol (ROBAXIN-750) 750 MG tablet, Take 1 tablet (750 mg total) by mouth 3 (three) times daily., Disp: 40 tablet, Rfl: 0   Multiple Vitamins-Minerals (MULTIVITAMIN WITH MINERALS) tablet, Take 1 tablet by mouth daily., Disp: , Rfl:    PEG 3350-KCl-NaBcb-NaCl-NaSulf (PEG-3350/ELECTROLYTES) 236 g SOLR, Use as directed, Disp: 4000 mL, Rfl: 0   telmisartan-hydrochlorothiazide (MICARDIS HCT) 80-12.5 MG tablet, Take 1 tablet by mouth daily., Disp: 90 tablet, Rfl: 1   telmisartan-hydrochlorothiazide (MICARDIS HCT) 80-25 MG tablet, Take 1 tablet  by mouth daily., Disp: 30 tablet, Rfl: 2   tiZANidine (ZANAFLEX) 4 MG tablet, Take 1 tablet (4 mg total) by mouth every 6 (six) hours as needed for muscle spasms., Disp: 30 tablet, Rfl: 0   traMADol (ULTRAM) 50 MG tablet, Take 1 tablet (50 mg total) by mouth 2 (two) times daily as needed., Disp: 30 tablet, Rfl: 2   traMADol (ULTRAM) 50 MG tablet, Take 1 tablet (50 mg total) by mouth 2 (two) times daily as needed., Disp: 30 tablet, Rfl: 0   triamcinolone cream (KENALOG) 0.1 %, Apply 1 Application topically daily as directed for 7 days, Disp: 15 g, Rfl: 0   Allergies  Allergen Reactions   Esomeprazole Magnesium Other (See Comments)    NDC HQIO:[id number]  NDC GMWN:[id number]  NDC HKVQ:[id number] NDC PPIR:[id number]    Past Medical History:  Diagnosis Date   Cervical spondylosis    DDD (degenerative disc disease), cervical    De Quervain's tenosynovitis, left    WRIST   Diabetic retinopathy, nonproliferative (HCC)    Diverticulosis    DJD (degenerative joint disease)    left knee   Heart murmur    told a last physical a slight heart murmurper patient   History of adenomatous polyp of colon    2009   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)   History of Bell's palsy    2003  RIGHT SIDE-  resolved   Hypertension    Thoracic aortic aneurysm (HCC)    followed by Dr Kathlee Nations Trigt- LOV- 02/2013 in [facility]    Thoracic ascending aortic aneurysm (HCC)    4.1 CM  STABLE SINCE 2004 PER DR VANTRIGHT NOTE 02/2013   TMJ (temporomandibular joint syndrome)    Type 2 diabetes mellitus (HCC)    Wears glasses      Past Surgical History:  Procedure Laterality Date   ABDOMINAL HYSTERECTOMY  1995   W/ UNILATERAL SALPINGOOPHORECTOMY   COLONOSCOPY N/A 04/14/2013   Procedure: COLONOSCOPY;  Surgeon: Charolett Bumpers, MD;  Location: [facility];  Service: Endoscopy;  Laterality: N/A;   DORSAL COMPARTMENT RELEASE Left 09/24/2013   Procedure: LEFT WRIST FIRST DORSAL COMPARTMENT  TENOSYNOVITIS;  Surgeon: Sharma Covert, MD;  Location: [facility];  Service: Orthopedics;  Laterality: Left;  ANESTHESIA: LOCAL/IV SEDATION   JOINT REPLACEMENT     KNEE ARTHROSCOPY Bilateral RIGHT 2002 & 1994/   LEFT  1995   LAPAROSCOPY ASSISTED  RIGHT COLECTOMY  03-22-2005   CECUM POLYP   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/2018   Procedure: REVERSE LEFT SHOULDER ARTHROPLASTY;  Surgeon: Beverely Low, MD;  Location: [facility] OR;  Service: Orthopedics;  Laterality: Left;   ROTATOR CUFF REPAIR Right 08-31-2008   TOTAL KNEE ARTHROPLASTY Right 07-01-2009   TOTAL KNEE ARTHROPLASTY Left 08/31/2014   Procedure: LEFT TOTAL KNEE ARTHROPLASTY;  Surgeon: Eugenia Mcalpine, MD;  Location: [facility];  Service: Orthopedics;  Laterality: Left;    Family History  Problem Relation Age of Onset   Cancer Mother    Diabetes Father     Social History   Tobacco Use   Smoking status: Former    Current packs/day: 0.00    Types: Cigarettes    Start date: 08/20/1957    Quit date: 08/21/1959    Years since quitting: 64.0   Smokeless tobacco: Never  Vaping Use   Vaping status: Never Used  Substance Use Topics   Alcohol use: No   Drug use: No    ROS   Objective:   Vitals: BP (!) 173/70 (BP Location: Right Arm)   Pulse 93   Temp 98.4 F (36.9 C) (Oral)   Resp 20   SpO2 96%   Physical Exam Constitutional:      General: She is not in acute distress.    Appearance: Normal appearance. She is well-developed and normal weight. She is not ill-appearing, toxic-appearing or diaphoretic.  HENT:     Head: Normocephalic and atraumatic.     Right Ear: Tympanic membrane, ear canal and external ear normal. No drainage or tenderness. No middle ear effusion. There is no impacted cerumen. Tympanic membrane is not erythematous or bulging.     Left Ear: Tympanic membrane, ear canal and external ear normal. No drainage or tenderness.  No middle ear effusion. There is no impacted cerumen. Tympanic membrane  is not erythematous or bulging.     Nose: Congestion present. No rhinorrhea.     Mouth/Throat:     Mouth: Mucous membranes are moist. No oral lesions.     Pharynx: No pharyngeal swelling, oropharyngeal exudate, posterior oropharyngeal erythema or uvula swelling.     Tonsils: No tonsillar exudate or tonsillar abscesses.     Comments: Cobblestone pattern postnasal drainage overlying pharynx. Eyes:     General: No scleral icterus.       Right eye: No discharge.        Left eye: No discharge.     Extraocular Movements: Extraocular movements intact.     Right eye: Normal extraocular motion.     Left eye: Normal extraocular motion.     Conjunctiva/sclera: Conjunctivae normal.  Cardiovascular:     Rate and Rhythm: Normal rate and regular rhythm.     Heart sounds: Normal heart sounds. No murmur heard.    No friction rub. No gallop.  Pulmonary:     Effort: Pulmonary effort is normal. No respiratory distress.     Breath sounds: No stridor. No wheezing, rhonchi or rales.  Chest:     Chest wall: No tenderness.  Musculoskeletal:     Cervical back: Normal range of motion and neck supple.  Lymphadenopathy:     Cervical: No cervical adenopathy.  Skin:    General: Skin is warm and dry.  Neurological:     General: No focal deficit present.     Mental Status: She is alert and oriented to person, place, and time.  Psychiatric:        Mood and Affect: Mood normal.        Behavior: Behavior normal.     Assessment and Plan :   PDMP not reviewed this encounter.  1. Acute viral syndrome    Deferred imaging given clear cardiopulmonary exam, hemodynamically stable vital signs.  Will manage for viral illness such as viral URI, viral syndrome, viral rhinitis, COVID-19. Recommended supportive care. Offered scripts for symptomatic relief. Testing is pending.  Counseled patient on potential for adverse effects with medications prescribed/recommended today, ER and return-to-clinic precautions discussed,  patient verbalized understanding.      Wallis Bamberg, [place] 09/06/23 [phone]

## 2023-09-06 NOTE — ED Triage Notes (Signed)
Pt c/o cough,head/chest congestion started yesterday-CP when coughs-denies fever-NAD-steady gait

## 2023-09-06 NOTE — Discharge Instructions (Addendum)
We will notify you of your test results as they arrive and may take between about 24 hours.  I encourage you to sign up for MyChart if you have not already done so as this can be the easiest way for us to communicate results to you online or through a phone app.  Generally, we only contact you if it is a positive test result.  In the meantime, if you develop worsening symptoms including fever, chest pain, shortness of breath despite our current treatment plan then please report to the emergency room as this may be a sign of worsening status from possible viral infection.  Otherwise, we will manage this as a viral syndrome. For sore throat or cough try using a honey-based tea. Use 3 teaspoons of honey with juice squeezed from half lemon. Place shaved pieces of ginger into 1/2-1 cup of water and warm over stove top. Then mix the ingredients and repeat every 4 hours as needed. Please take Tylenol 500mg-650mg every 6 hours for aches and pains, fevers. Hydrate very well with at least 2 liters of water. Eat light meals such as soups to replenish electrolytes and soft fruits, veggies. Start an antihistamine like Zyrtec (10mg daily) for postnasal drainage, sinus congestion.  You can take this together with cough medications as needed.  

## 2023-09-07 LAB — SARS CORONAVIRUS 2 (TAT 6-24 HRS): SARS Coronavirus 2: NEGATIVE

## 2023-09-09 ENCOUNTER — Emergency Department (HOSPITAL_COMMUNITY)
Admission: EM | Admit: 2023-09-09 | Discharge: 2023-09-09 | Disposition: A | Discharge status: Home / Self Care | Payer: Commercial Managed Care - PPO | Attending: Emergency Medicine | Admitting: Emergency Medicine

## 2023-09-09 ENCOUNTER — Other Ambulatory Visit (HOSPITAL_COMMUNITY): Payer: Self-pay

## 2023-09-09 ENCOUNTER — Emergency Department (HOSPITAL_COMMUNITY): Payer: Commercial Managed Care - PPO

## 2023-09-09 ENCOUNTER — Other Ambulatory Visit: Payer: Self-pay

## 2023-09-09 ENCOUNTER — Encounter (HOSPITAL_COMMUNITY): Payer: Self-pay

## 2023-09-09 DIAGNOSIS — J111 Influenza due to unidentified influenza virus with other respiratory manifestations: Secondary | ICD-10-CM

## 2023-09-09 DIAGNOSIS — R0789 Other chest pain: Secondary | ICD-10-CM | POA: Diagnosis not present

## 2023-09-09 DIAGNOSIS — J101 Influenza due to other identified influenza virus with other respiratory manifestations: Secondary | ICD-10-CM | POA: Diagnosis not present

## 2023-09-09 DIAGNOSIS — R079 Chest pain, unspecified: Secondary | ICD-10-CM | POA: Diagnosis not present

## 2023-09-09 DIAGNOSIS — Z20822 Contact with and (suspected) exposure to covid-19: Secondary | ICD-10-CM | POA: Insufficient documentation

## 2023-09-09 DIAGNOSIS — R058 Other specified cough: Secondary | ICD-10-CM | POA: Diagnosis not present

## 2023-09-09 DIAGNOSIS — I712 Thoracic aortic aneurysm, without rupture, unspecified: Secondary | ICD-10-CM | POA: Diagnosis not present

## 2023-09-09 DIAGNOSIS — Z96612 Presence of left artificial shoulder joint: Secondary | ICD-10-CM | POA: Diagnosis not present

## 2023-09-09 LAB — CBC
HCT: 35.8 % — ABNORMAL LOW (ref 36.0–46.0)
Hemoglobin: 11.7 g/dL — ABNORMAL LOW (ref 12.0–15.0)
MCH: 26.8 pg (ref 26.0–34.0)
MCHC: 32.7 g/dL (ref 30.0–36.0)
MCV: 82.1 fL (ref 80.0–100.0)
Platelets: 126 10*3/uL — ABNORMAL LOW (ref 150–400)
RBC: 4.36 MIL/uL (ref 3.87–5.11)
RDW: 15 % (ref 11.5–15.5)
WBC: 3 10*3/uL — ABNORMAL LOW (ref 4.0–10.5)
nRBC: 0 % (ref 0.0–0.2)

## 2023-09-09 LAB — RESP PANEL BY RT-PCR (RSV, FLU A&B, COVID)  RVPGX2
Influenza A by PCR: POSITIVE — AB
Influenza B by PCR: NEGATIVE
Resp Syncytial Virus by PCR: NEGATIVE
SARS Coronavirus 2 by RT PCR: NEGATIVE

## 2023-09-09 LAB — BASIC METABOLIC PANEL
Anion gap: 15 (ref 5–15)
BUN: 13 mg/dL (ref 8–23)
CO2: 23 mmol/L (ref 22–32)
Calcium: 9.1 mg/dL (ref 8.9–10.3)
Chloride: 102 mmol/L (ref 98–111)
Creatinine, Ser: 1.12 mg/dL — ABNORMAL HIGH (ref 0.44–1.00)
GFR, Estimated: 49 mL/min — ABNORMAL LOW (ref >60)
Glucose, Bld: 137 mg/dL — ABNORMAL HIGH (ref 70–99)
Potassium: 3.4 mmol/L — ABNORMAL LOW (ref 3.5–5.1)
Sodium: 140 mmol/L (ref 135–145)

## 2023-09-09 LAB — TROPONIN I (HIGH SENSITIVITY)
Troponin I (High Sensitivity): 21 ng/L — ABNORMAL HIGH (ref <18)
Troponin I (High Sensitivity): 17 ng/L (ref <18)

## 2023-09-09 MED ORDER — OSELTAMIVIR PHOSPHATE 75 MG PO CAPS
75.0000 mg | ORAL_CAPSULE | Freq: Two times a day (BID) | ORAL | 0 refills | Status: AC
  Filled 2023-09-09 (×2): qty 10, 5d supply, fill #0

## 2023-09-09 NOTE — ED Triage Notes (Signed)
Pt c/o chest pain, productive cough, shortness of breath, body aches and congestion x 5 days. Pt seen for UC for same 3 days ago.

## 2023-09-09 NOTE — Discharge Instructions (Addendum)
Return if any problems.

## 2023-09-09 NOTE — ED Provider Notes (Signed)
[facility] EMERGENCY DEPARTMENT AT [facility] Provider Note   CSN: [id number] Arrival date & time: 09/09/23  4098     History  Chief Complaint  Patient presents with   Chest Pain    Savannah Lewis is a 83 y.o. female.  Pt complains of pain in her chest.  Pt reports she has had a cough and congestion.  Pt was seen on 1/17 at Urgent care.  Pt reports no relief with medications.    The history is provided by the patient. No language interpreter was used.  Chest Pain Pain location:  Unable to specify Pain quality: aching   Pain radiates to:  Does not radiate Timing:  Constant Progression:  Worsening Context: not breathing   Relieved by:  Nothing Worsened by:  Nothing Ineffective treatments:  None tried Associated symptoms: cough        Home Medications Prior to Admission medications   Medication Sig Start Date End Date Taking? Authorizing Provider  acetaminophen (TYLENOL) 325 MG tablet Take 2 tablets (650 mg total) by mouth every 6 (six) hours as needed for moderate pain. 01/16/23   Wallis Bamberg, PA-C  amLODipine (NORVASC) 10 MG tablet Take 0.5 tablets by mouth daily.    [provider]  amLODipine (NORVASC) 5 MG tablet Take 1 tablet (5 mg total) by mouth daily. 06/17/23     ascorbic acid (VITAMIN C) 500 MG tablet  09/18/19   [provider]  benzonatate (TESSALON) 100 MG capsule Take 1 capsule (100 mg total) by mouth 3 (three) times daily as needed for cough. 09/06/23   Wallis Bamberg, PA-C  Blood Glucose Monitoring Suppl (FREESTYLE LITE) w/Device KIT Use as directed to test blood sugar once a day and as needed. 03/06/21     Calcium Carb-Cholecalciferol (CALCIUM 600 + D) 600-5 MG-MCG TABS 1 tablet    [provider]  calcium carbonate (OSCAL) 1500 (600 Ca) MG TABS tablet Take 600 mg of elemental calcium by mouth daily with breakfast.     [provider]  cephALEXin (KEFLEX) 500 MG capsule cephalexin 500 mg capsule    [provider]  cetirizine (ZYRTEC ALLERGY) 10 MG tablet Take 1 tablet (10 mg total) by mouth daily. 09/06/23   Wallis Bamberg, PA-C  Cholecalciferol (VITAMIN D3) 10 MCG (400 UNIT) tablet as needed. 09/18/19   [provider]  doxycycline (VIBRA-TABS) 100 MG tablet Take 1 tablet (100 mg total) by mouth every 12 (twelve) hours. 03/24/23     furosemide (LASIX) 40 MG tablet Take 1 tablet by mouth once daily for 3 days 12/07/20     glucose blood (ACCU-CHEK AVIVA PLUS) test strip USE TO TEST BLOOD SUGAR ONCE A DAY AND AS NEEDED 90 days 03/06/21     glucose blood (FREESTYLE LITE) test strip Use as directed to test blood sugar once daily and as needed 04/30/22   Kirby Funk, MD  guaifenesin (HUMIBID E) 400 MG TABS tablet Take 1 tablet by mouth 3 times daily as needed for chest congestion and cough 09/27/21   Theadora Rama Scales, PA-C  ipratropium (ATROVENT) 0.06 % nasal spray Place 2 sprays into both nostrils 4 (four) times daily as needed for nasal congestion, runny nose 09/27/21   Theadora Rama Scales, PA-C  Lancets (FREESTYLE) lancets Use as directed to test blood sugar once a day and as needed 04/30/22   Kirby Funk, MD  lidocaine (XYLOCAINE) 5 % ointment Apply 1 Application topically as needed. 08/01/22   Long, Arlyss Repress,  MD  meloxicam (MOBIC) 15 MG tablet Take 1 tablet (15 mg total) by mouth daily. 07/05/22     meloxicam (MOBIC) 7.5 MG tablet Take 1 tablet (7.5 mg total) by mouth daily. 09/04/22     metFORMIN (GLUCOPHAGE) 500 MG tablet Take 2 tablets by mouth twice a day. 06/01/21     metFORMIN (GLUCOPHAGE) 500 MG tablet Take 2 tablets (1,000 mg total) by mouth daily. 11/07/22     methocarbamol (ROBAXIN) 750 MG tablet Take 1 tablet (750 mg total) by mouth every 6 (six) hours as needed for 7 days 09/04/22     methocarbamol (ROBAXIN-750) 750 MG tablet Take 1 tablet (750 mg total) by mouth 3 (three) times daily. 08/08/22     Multiple Vitamins-Minerals (MULTIVITAMIN WITH MINERALS) tablet Take 1 tablet by  mouth daily.    [provider]  PEG 3350-KCl-NaBcb-NaCl-NaSulf (PEG-3350/ELECTROLYTES) 236 g SOLR Use as directed 07/30/23   Kerin Salen, MD  promethazine-dextromethorphan (PROMETHAZINE-DM) 6.25-15 MG/5ML syrup Take 2.5 mLs by mouth 3 (three) times daily as needed for cough. 09/06/23   Wallis Bamberg, PA-C  telmisartan-hydrochlorothiazide (MICARDIS HCT) 80-12.5 MG tablet Take 1 tablet by mouth daily. 12/27/22     telmisartan-hydrochlorothiazide (MICARDIS HCT) 80-25 MG tablet Take 1 tablet by mouth daily. 01/30/23     tiZANidine (ZANAFLEX) 4 MG tablet Take 1 tablet (4 mg total) by mouth every 6 (six) hours as needed for muscle spasms. 04/27/22   Al Decant, PA-C  traMADol (ULTRAM) 50 MG tablet Take 1 tablet (50 mg total) by mouth 2 (two) times daily as needed. 09/04/22     traMADol (ULTRAM) 50 MG tablet Take 1 tablet (50 mg total) by mouth 2 (two) times daily as needed. 04/19/23     triamcinolone cream (KENALOG) 0.1 % Apply 1 Application topically daily as directed for 7 days 01/30/23         Allergies    Esomeprazole magnesium    Review of Systems   Review of Systems  Respiratory:  Positive for cough.   Cardiovascular:  Positive for chest pain.  All other systems reviewed and are negative.   Physical Exam Updated Vital Signs BP (!) 166/51   Pulse 89   Temp 97.9 F (36.6 C) (Oral)   Resp 16   Ht 5\' 2"  (1.575 m)   Wt 57.6 kg   SpO2 100%   BMI 23.23 kg/m  Physical Exam Vitals and nursing note reviewed.  Constitutional:      Appearance: She is well-developed.  HENT:     Head: Normocephalic.  Cardiovascular:     Rate and Rhythm: Normal rate and regular rhythm.     Heart sounds: Normal heart sounds.  Pulmonary:     Effort: Pulmonary effort is normal.     Breath sounds: Normal breath sounds.  Abdominal:     General: There is no distension.     Palpations: Abdomen is soft.  Musculoskeletal:        General: Normal range of motion.     Cervical back: Normal range of  motion.  Skin:    General: Skin is warm.  Neurological:     General: No focal deficit present.     Mental Status: She is alert and oriented to person, place, and time.  Psychiatric:        Mood and Affect: Mood normal.     ED Results / Procedures / Treatments   Labs (all labs ordered are listed, but only abnormal results are displayed) Labs Reviewed  BASIC METABOLIC PANEL - Abnormal; Notable for the following components:      Result Value   Potassium 3.4 (*)    Glucose, Bld 137 (*)    Creatinine, Ser 1.12 (*)    GFR, Estimated 49 (*)    All other components within normal limits  CBC - Abnormal; Notable for the following components:   WBC 3.0 (*)    Hemoglobin 11.7 (*)    HCT 35.8 (*)    Platelets 126 (*)    All other components within normal limits  TROPONIN I (HIGH SENSITIVITY) - Abnormal; Notable for the following components:   Troponin I (High Sensitivity) 21 (*)    All other components within normal limits  RESP PANEL BY RT-PCR (RSV, FLU A&B, COVID)  [id number]    EKG None  Radiology DG Chest 2 View Result Date: 09/09/2023 CLINICAL DATA:  Chest pain and productive cough. Shortness of breath. EXAM: CHEST - 2 VIEW COMPARISON:  None Available. FINDINGS: The lungs are clear without focal pneumonia, edema, pneumothorax or pleural effusion. Cardiopericardial silhouette is at upper limits of normal for size. No acute bony abnormality. Left shoulder replacement. IMPRESSION: No active cardiopulmonary disease. 4 cm thoracic aortic aneurysm characterized on chest CTA 07/13/2021 is not readily evident on x-ray today. Electronically Signed   By: Kennith Center M.D.   On: 09/09/2023 08:08    Procedures Procedures    Medications Ordered in ED Medications - No data to display  ED Course/ Medical Decision Making/ A&P                                 Medical Decision Making Patient complains of a cough and congestion.  Amount and/or Complexity of Data Reviewed Labs: ordered.  Decision-making details documented in ED Course.    Details: Labs ordered reviewed and interpreted influenza A is positive Radiology: ordered and independent interpretation performed. Decision-making details documented in ED Course.    Details: Chest x-ray ordered reviewed and interpreted no acute findings  Risk Prescription drug management.           Final Clinical Impression(s) / ED Diagnoses Final diagnoses:  Influenza    Rx / DC Orders ED Discharge Orders     None     An After Visit Summary was printed and given to the patient.     Osie Cheeks 09/09/23 1243    Lorre Nick, MD 09/11/23 [phone]

## 2023-09-11 DIAGNOSIS — I10 Essential (primary) hypertension: Secondary | ICD-10-CM | POA: Diagnosis not present

## 2023-09-11 DIAGNOSIS — J111 Influenza due to unidentified influenza virus with other respiratory manifestations: Secondary | ICD-10-CM | POA: Diagnosis not present

## 2023-09-12 ENCOUNTER — Other Ambulatory Visit (HOSPITAL_COMMUNITY): Payer: Self-pay

## 2023-09-12 ENCOUNTER — Other Ambulatory Visit: Payer: Self-pay

## 2023-09-12 MED ORDER — TELMISARTAN-HCTZ 80-25 MG PO TABS
1.0000 | ORAL_TABLET | Freq: Every day | ORAL | 3 refills | Status: DC
  Filled 2023-09-12: qty 90, 90d supply, fill #0
  Filled 2023-12-23: qty 90, 90d supply, fill #1
  Filled 2024-03-04: qty 90, 90d supply, fill #2
  Filled 2024-05-27: qty 90, 90d supply, fill #3

## 2023-09-25 DIAGNOSIS — Z1231 Encounter for screening mammogram for malignant neoplasm of breast: Secondary | ICD-10-CM | POA: Diagnosis not present

## 2023-10-03 DIAGNOSIS — E119 Type 2 diabetes mellitus without complications: Secondary | ICD-10-CM | POA: Diagnosis not present

## 2023-10-18 ENCOUNTER — Other Ambulatory Visit (HOSPITAL_COMMUNITY): Payer: Self-pay | Admitting: Internal Medicine

## 2023-10-18 DIAGNOSIS — E1169 Type 2 diabetes mellitus with other specified complication: Secondary | ICD-10-CM | POA: Diagnosis not present

## 2023-10-18 DIAGNOSIS — I7121 Aneurysm of the ascending aorta, without rupture: Secondary | ICD-10-CM

## 2023-10-18 DIAGNOSIS — I10 Essential (primary) hypertension: Secondary | ICD-10-CM | POA: Diagnosis not present

## 2023-10-18 DIAGNOSIS — E7800 Pure hypercholesterolemia, unspecified: Secondary | ICD-10-CM | POA: Diagnosis not present

## 2023-10-23 ENCOUNTER — Ambulatory Visit (HOSPITAL_COMMUNITY)
Admission: RE | Admit: 2023-10-23 | Discharge: 2023-10-23 | Disposition: A | Discharge status: Home / Self Care | Payer: Commercial Managed Care - PPO | Source: Ambulatory Visit | Attending: Internal Medicine | Admitting: Internal Medicine

## 2023-10-23 DIAGNOSIS — E119 Type 2 diabetes mellitus without complications: Secondary | ICD-10-CM | POA: Insufficient documentation

## 2023-10-23 DIAGNOSIS — R911 Solitary pulmonary nodule: Secondary | ICD-10-CM | POA: Diagnosis not present

## 2023-10-23 DIAGNOSIS — I3139 Other pericardial effusion (noninflammatory): Secondary | ICD-10-CM | POA: Diagnosis not present

## 2023-10-23 DIAGNOSIS — I7121 Aneurysm of the ascending aorta, without rupture: Secondary | ICD-10-CM | POA: Insufficient documentation

## 2023-10-23 DIAGNOSIS — I700 Atherosclerosis of aorta: Secondary | ICD-10-CM | POA: Diagnosis not present

## 2023-10-23 LAB — POCT I-STAT CREATININE: Creatinine, Ser: 1.1 mg/dL — ABNORMAL HIGH (ref 0.44–1.00)

## 2023-10-23 MED ORDER — IOHEXOL 350 MG/ML SOLN
75.0000 mL | Freq: Once | INTRAVENOUS | Status: AC | PRN
  Administered 2023-10-23: 75 mL via INTRAVENOUS

## 2023-10-31 ENCOUNTER — Ambulatory Visit
Admission: RE | Admit: 2023-10-31 | Discharge: 2023-10-31 | Disposition: A | Discharge status: Home / Self Care | Payer: Commercial Managed Care - PPO | Source: Ambulatory Visit | Attending: Internal Medicine | Admitting: Internal Medicine

## 2023-10-31 DIAGNOSIS — E2839 Other primary ovarian failure: Secondary | ICD-10-CM | POA: Diagnosis not present

## 2023-10-31 DIAGNOSIS — Z90722 Acquired absence of ovaries, bilateral: Secondary | ICD-10-CM | POA: Diagnosis not present

## 2023-10-31 DIAGNOSIS — M8588 Other specified disorders of bone density and structure, other site: Secondary | ICD-10-CM | POA: Diagnosis not present

## 2023-10-31 DIAGNOSIS — M8589 Other specified disorders of bone density and structure, multiple sites: Secondary | ICD-10-CM

## 2023-10-31 DIAGNOSIS — N958 Other specified menopausal and perimenopausal disorders: Secondary | ICD-10-CM | POA: Diagnosis not present

## 2023-11-20 DIAGNOSIS — M25511 Pain in right shoulder: Secondary | ICD-10-CM | POA: Diagnosis not present

## 2023-12-10 ENCOUNTER — Other Ambulatory Visit (HOSPITAL_COMMUNITY): Payer: Self-pay

## 2023-12-23 ENCOUNTER — Other Ambulatory Visit (HOSPITAL_COMMUNITY): Payer: Self-pay

## 2024-01-14 DIAGNOSIS — I459 Conduction disorder, unspecified: Secondary | ICD-10-CM | POA: Diagnosis not present

## 2024-01-14 DIAGNOSIS — I10 Essential (primary) hypertension: Secondary | ICD-10-CM | POA: Diagnosis not present

## 2024-02-17 DIAGNOSIS — G44209 Tension-type headache, unspecified, not intractable: Secondary | ICD-10-CM | POA: Diagnosis not present

## 2024-02-17 DIAGNOSIS — M542 Cervicalgia: Secondary | ICD-10-CM | POA: Diagnosis not present

## 2024-02-17 DIAGNOSIS — I10 Essential (primary) hypertension: Secondary | ICD-10-CM | POA: Diagnosis not present

## 2024-03-04 ENCOUNTER — Other Ambulatory Visit (HOSPITAL_COMMUNITY): Payer: Self-pay

## 2024-03-05 ENCOUNTER — Other Ambulatory Visit (HOSPITAL_COMMUNITY): Payer: Self-pay

## 2024-03-05 MED ORDER — FREESTYLE LITE TEST VI STRP
ORAL_STRIP | 3 refills | Status: AC
  Filled 2024-03-05 (×3): qty 50, 50d supply, fill #0

## 2024-03-05 MED ORDER — FREESTYLE LANCETS MISC
1.0000 | Freq: Every day | 3 refills | Status: AC
  Filled 2024-03-05: qty 100, 90d supply, fill #0

## 2024-03-06 ENCOUNTER — Encounter: Payer: Self-pay | Admitting: Pharmacist

## 2024-03-06 ENCOUNTER — Other Ambulatory Visit: Payer: Self-pay

## 2024-03-09 DIAGNOSIS — E119 Type 2 diabetes mellitus without complications: Secondary | ICD-10-CM | POA: Diagnosis not present

## 2024-03-10 ENCOUNTER — Other Ambulatory Visit: Payer: Self-pay

## 2024-04-13 ENCOUNTER — Ambulatory Visit (INDEPENDENT_AMBULATORY_CARE_PROVIDER_SITE_OTHER): Admitting: Podiatry

## 2024-04-13 ENCOUNTER — Encounter: Payer: Self-pay | Admitting: Podiatry

## 2024-04-13 DIAGNOSIS — M179 Osteoarthritis of knee, unspecified: Secondary | ICD-10-CM | POA: Insufficient documentation

## 2024-04-13 DIAGNOSIS — M79674 Pain in right toe(s): Secondary | ICD-10-CM

## 2024-04-13 DIAGNOSIS — E041 Nontoxic single thyroid nodule: Secondary | ICD-10-CM | POA: Insufficient documentation

## 2024-04-13 DIAGNOSIS — D489 Neoplasm of uncertain behavior, unspecified: Secondary | ICD-10-CM | POA: Insufficient documentation

## 2024-04-13 DIAGNOSIS — Z860100 Personal history of colon polyps, unspecified: Secondary | ICD-10-CM | POA: Insufficient documentation

## 2024-04-13 DIAGNOSIS — M8580 Other specified disorders of bone density and structure, unspecified site: Secondary | ICD-10-CM | POA: Insufficient documentation

## 2024-04-13 DIAGNOSIS — R1310 Dysphagia, unspecified: Secondary | ICD-10-CM | POA: Insufficient documentation

## 2024-04-13 DIAGNOSIS — E7800 Pure hypercholesterolemia, unspecified: Secondary | ICD-10-CM | POA: Insufficient documentation

## 2024-04-13 DIAGNOSIS — M5030 Other cervical disc degeneration, unspecified cervical region: Secondary | ICD-10-CM | POA: Insufficient documentation

## 2024-04-13 DIAGNOSIS — M75102 Unspecified rotator cuff tear or rupture of left shoulder, not specified as traumatic: Secondary | ICD-10-CM | POA: Insufficient documentation

## 2024-04-13 DIAGNOSIS — H9313 Tinnitus, bilateral: Secondary | ICD-10-CM | POA: Insufficient documentation

## 2024-04-13 DIAGNOSIS — K219 Gastro-esophageal reflux disease without esophagitis: Secondary | ICD-10-CM | POA: Insufficient documentation

## 2024-04-13 DIAGNOSIS — R112 Nausea with vomiting, unspecified: Secondary | ICD-10-CM | POA: Insufficient documentation

## 2024-04-13 DIAGNOSIS — K5730 Diverticulosis of large intestine without perforation or abscess without bleeding: Secondary | ICD-10-CM | POA: Insufficient documentation

## 2024-04-13 DIAGNOSIS — H9190 Unspecified hearing loss, unspecified ear: Secondary | ICD-10-CM | POA: Insufficient documentation

## 2024-04-13 DIAGNOSIS — D509 Iron deficiency anemia, unspecified: Secondary | ICD-10-CM | POA: Insufficient documentation

## 2024-04-13 DIAGNOSIS — I499 Cardiac arrhythmia, unspecified: Secondary | ICD-10-CM | POA: Insufficient documentation

## 2024-04-13 DIAGNOSIS — B351 Tinea unguium: Secondary | ICD-10-CM

## 2024-04-13 DIAGNOSIS — N8111 Cystocele, midline: Secondary | ICD-10-CM | POA: Insufficient documentation

## 2024-04-13 DIAGNOSIS — K5900 Constipation, unspecified: Secondary | ICD-10-CM | POA: Insufficient documentation

## 2024-04-13 DIAGNOSIS — R1084 Generalized abdominal pain: Secondary | ICD-10-CM | POA: Insufficient documentation

## 2024-04-13 DIAGNOSIS — I700 Atherosclerosis of aorta: Secondary | ICD-10-CM | POA: Insufficient documentation

## 2024-04-13 DIAGNOSIS — M79675 Pain in left toe(s): Secondary | ICD-10-CM | POA: Diagnosis not present

## 2024-04-13 DIAGNOSIS — N951 Menopausal and female climacteric states: Secondary | ICD-10-CM | POA: Insufficient documentation

## 2024-04-13 DIAGNOSIS — R911 Solitary pulmonary nodule: Secondary | ICD-10-CM | POA: Insufficient documentation

## 2024-04-13 DIAGNOSIS — R202 Paresthesia of skin: Secondary | ICD-10-CM | POA: Insufficient documentation

## 2024-04-13 DIAGNOSIS — G8929 Other chronic pain: Secondary | ICD-10-CM | POA: Insufficient documentation

## 2024-04-13 DIAGNOSIS — M75120 Complete rotator cuff tear or rupture of unspecified shoulder, not specified as traumatic: Secondary | ICD-10-CM | POA: Insufficient documentation

## 2024-04-13 NOTE — Progress Notes (Signed)
   Chief Complaint  Patient presents with   Nail Problem    Hallux right - thick, lifted nail x years, had removed years ago, but grew back, if he can trim it back that would be good, didn't know if it needed to be removed again, diabetic - 6.9   New Patient (Initial Visit)    Est pt 2023    SUBJECTIVE Patient presents to office today complaining of elongated, thickened nails that cause pain while ambulating in shoes.  Patient is unable to trim their own nails. Patient is here for further evaluation and treatment.  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     OBJECTIVE General Patient is awake, alert, and oriented x 3 and in no acute distress. Derm Skin is dry and supple bilateral. Negative open lesions or macerations. Remaining integument unremarkable. Nails are tender, long, thickened and dystrophic with subungual debris, consistent with onychomycosis, 1-5 bilateral. No signs of infection noted. Vasc  DP and PT  pedal pulses palpable bilaterally. Temperature gradient within normal limits.  Neuro Epicritic and protective threshold sensation grossly intact bilaterally.  Musculoskeletal Exam No symptomatic pedal deformities noted bilateral. Muscular strength within normal limits.  ASSESSMENT 1.  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 Patient evaluated today.  2. Instructed to maintain good pedal hygiene and foot care.  3. Mechanical debridement of nails 1-5 bilaterally performed using a nail nipper. Filed with dremel without incident.  4. Return to clinic in 3 mos.    Thresa EMERSON Sar, DPM [facility]  Dr. Thresa EMERSON Sar, DPM    [address] [place], [place] [address]                Office [phone]  Fax [phone]

## 2024-04-21 DIAGNOSIS — I7121 Aneurysm of the ascending aorta, without rupture: Secondary | ICD-10-CM | POA: Diagnosis not present

## 2024-04-21 DIAGNOSIS — M8589 Other specified disorders of bone density and structure, multiple sites: Secondary | ICD-10-CM | POA: Diagnosis not present

## 2024-04-21 DIAGNOSIS — E7800 Pure hypercholesterolemia, unspecified: Secondary | ICD-10-CM | POA: Diagnosis not present

## 2024-04-21 DIAGNOSIS — D509 Iron deficiency anemia, unspecified: Secondary | ICD-10-CM | POA: Diagnosis not present

## 2024-04-21 DIAGNOSIS — E1169 Type 2 diabetes mellitus with other specified complication: Secondary | ICD-10-CM | POA: Diagnosis not present

## 2024-04-21 DIAGNOSIS — I10 Essential (primary) hypertension: Secondary | ICD-10-CM | POA: Diagnosis not present

## 2024-04-22 ENCOUNTER — Other Ambulatory Visit: Payer: Self-pay

## 2024-04-22 ENCOUNTER — Other Ambulatory Visit (HOSPITAL_COMMUNITY): Payer: Self-pay

## 2024-04-22 MED ORDER — PRAVASTATIN SODIUM 20 MG PO TABS
20.0000 mg | ORAL_TABLET | Freq: Every day | ORAL | 2 refills | Status: DC
  Filled 2024-04-22: qty 30, 30d supply, fill #0
  Filled 2024-05-27: qty 30, 30d supply, fill #1
  Filled 2024-07-27: qty 30, 30d supply, fill #2

## 2024-04-24 ENCOUNTER — Other Ambulatory Visit (HOSPITAL_COMMUNITY): Payer: Self-pay

## 2024-05-13 DIAGNOSIS — M25511 Pain in right shoulder: Secondary | ICD-10-CM | POA: Diagnosis not present

## 2024-05-13 DIAGNOSIS — M1611 Unilateral primary osteoarthritis, right hip: Secondary | ICD-10-CM | POA: Diagnosis not present

## 2024-05-18 ENCOUNTER — Other Ambulatory Visit (HOSPITAL_COMMUNITY): Payer: Self-pay

## 2024-05-18 MED ORDER — PNEUMOCOCCAL 20-VAL CONJ VACC 0.5 ML IM SUSY
0.5000 mL | PREFILLED_SYRINGE | Freq: Once | INTRAMUSCULAR | 0 refills | Status: AC
Start: 2024-05-18 — End: 2024-05-19
  Filled 2024-05-18: qty 0.5, 1d supply, fill #0

## 2024-05-27 ENCOUNTER — Other Ambulatory Visit: Payer: Self-pay

## 2024-06-22 ENCOUNTER — Other Ambulatory Visit (HOSPITAL_COMMUNITY): Payer: Self-pay

## 2024-06-22 MED ORDER — AMLODIPINE BESYLATE 5 MG PO TABS
5.0000 mg | ORAL_TABLET | Freq: Every day | ORAL | 3 refills | Status: AC
  Filled 2024-06-22: qty 90, 90d supply, fill #0
  Filled 2024-09-16: qty 90, 90d supply, fill #1

## 2024-06-24 ENCOUNTER — Other Ambulatory Visit: Payer: Self-pay

## 2024-06-24 ENCOUNTER — Other Ambulatory Visit (HOSPITAL_COMMUNITY): Payer: Self-pay

## 2024-06-24 MED ORDER — FREESTYLE LITE TEST VI STRP
ORAL_STRIP | 3 refills | Status: AC
  Filled 2024-06-24: qty 100, 90d supply, fill #0

## 2024-06-26 ENCOUNTER — Other Ambulatory Visit: Payer: Self-pay

## 2024-06-29 ENCOUNTER — Encounter (HOSPITAL_COMMUNITY): Payer: Self-pay | Admitting: *Deleted

## 2024-06-29 ENCOUNTER — Emergency Department (HOSPITAL_COMMUNITY)

## 2024-06-29 ENCOUNTER — Other Ambulatory Visit: Payer: Self-pay

## 2024-06-29 ENCOUNTER — Emergency Department (HOSPITAL_COMMUNITY)
Admission: EM | Admit: 2024-06-29 | Discharge: 2024-06-29 | Disposition: A | Discharge status: Home / Self Care | Attending: Emergency Medicine | Admitting: Emergency Medicine

## 2024-06-29 DIAGNOSIS — Z79899 Other long term (current) drug therapy: Secondary | ICD-10-CM | POA: Diagnosis not present

## 2024-06-29 DIAGNOSIS — M25511 Pain in right shoulder: Secondary | ICD-10-CM | POA: Diagnosis not present

## 2024-06-29 DIAGNOSIS — I10 Essential (primary) hypertension: Secondary | ICD-10-CM | POA: Insufficient documentation

## 2024-06-29 DIAGNOSIS — E119 Type 2 diabetes mellitus without complications: Secondary | ICD-10-CM | POA: Insufficient documentation

## 2024-06-29 DIAGNOSIS — Z043 Encounter for examination and observation following other accident: Secondary | ICD-10-CM | POA: Diagnosis not present

## 2024-06-29 DIAGNOSIS — M19011 Primary osteoarthritis, right shoulder: Secondary | ICD-10-CM | POA: Diagnosis not present

## 2024-06-29 MED ORDER — FENTANYL CITRATE (PF) 50 MCG/ML IJ SOSY
50.0000 ug | PREFILLED_SYRINGE | Freq: Once | INTRAMUSCULAR | Status: AC
Start: 2024-06-29 — End: 2024-06-29
  Administered 2024-06-29: 50 ug via INTRAMUSCULAR
  Filled 2024-06-29: qty 1

## 2024-06-29 NOTE — Discharge Instructions (Addendum)
 It was a pleasure taking care of you today. You were seen in the Emergency Department for right shoulder pain. Your work-up was reassuring. Your x-ray showed no evidence of fracture or dislocation.  However, due to the acute nature of your pain please follow-up with Dr. Kay from [facility] within 1 week.  I am placing your shoulder in a sling.  Please keep the sling on while you are awake and asleep, but you may remove it to shower.  You can also take Tylenol  and ibuprofen  as needed for pain management.  You may use ice for 20 minutes at a time for pain as well.  Refer to the attached documentation for further management of your symptoms.  If you do not hear from Dr. Gelene office within 1-2 days, please call to schedule an appointment.  Please return to the ER if you experience chest pain, trouble breathing, intractable nausea/vomiting or any other life threatening illnesses.

## 2024-06-29 NOTE — ED Provider Notes (Signed)
 [place] EMERGENCY DEPARTMENT AT [facility] Provider Note   CSN: [id number] Arrival date & time: 06/29/24  1035     Patient presents with: Fall   Savannah Lewis is a 83 y.o. 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  Patient reports she was walking on the treadmill this morning and she tried to stop it prior to getting off.  However, she tried to step off the treadmill before it came to a complete stop and she fell.  She reports falling on her right side and is endorsing significant right shoulder pain.  She is unable to move her shoulder significantly at this time.  She denies any additional pain.  She did not hit her head.  She denies LOC. Patient not on any blood thinners.   Fall       Prior to Admission medications   Medication Sig Start Date End Date Taking? Authorizing Provider  acetaminophen  (TYLENOL ) 325 MG tablet Take 2 tablets (650 mg total) by mouth every 6 (six) hours as needed for moderate pain. 01/16/23   Christopher Savannah, PA-C  amLODipine  (NORVASC ) 5 MG tablet Take 1 tablet (5 mg total) by mouth daily. 06/22/24     benzonatate  (TESSALON ) 100 MG capsule Take 1 capsule (100 mg total) by mouth 3 (three) times daily as needed for cough. 09/06/23   Christopher Savannah, PA-C  Calcium  Carb-Cholecalciferol (CALCIUM  600 + D) 600-5 MG-MCG TABS 1 tablet    [provider]  calcium  carbonate (OSCAL) 1500 (600 Ca) MG TABS tablet Take 600 mg of elemental calcium  by mouth daily with breakfast.     [provider]  cetirizine  (ZYRTEC  ALLERGY) 10 MG tablet Take 1 tablet (10 mg total) by mouth daily. 09/06/23   Christopher Savannah, PA-C  Cholecalciferol (VITAMIN D3) 10 MCG (400 UNIT) tablet as needed. 09/18/19   [provider]  glucose blood (FREESTYLE LITE) test strip Used to check blood sugars daily. 03/04/24     glucose  blood (FREESTYLE LITE) test strip Used to check blood sugars In Vitro daily Dx: E11.69 06/24/24     guaifenesin  (HUMIBID E) 400 MG TABS tablet Take 1 tablet by mouth 3 times daily as needed for chest congestion and cough 09/27/21   Joesph Shaver Scales, PA-C  ipratropium (ATROVENT ) 0.06 % nasal spray Place 2 sprays into both nostrils 4 (four) times daily as needed for nasal congestion, runny nose 09/27/21   Joesph Shaver Scales, PA-C  Lancets (FREESTYLE) lancets Use to check blood sugars daily. 03/04/24     meloxicam  (MOBIC ) 15 MG tablet Take 1 tablet (15 mg total) by mouth daily. 07/05/22     Multiple Vitamins-Minerals (MULTIVITAMIN WITH MINERALS) tablet Take 1 tablet by mouth daily.    [provider]  pravastatin  (PRAVACHOL ) 20 MG tablet Take 1 tablet (20 mg total) by mouth daily. 04/22/24     promethazine -dextromethorphan (PROMETHAZINE -DM) 6.25-15 MG/5ML syrup Take 2.5 mLs by mouth 3 (three) times daily as needed for cough. 09/06/23   Christopher Savannah, PA-C  telmisartan -hydrochlorothiazide  (MICARDIS  HCT) 80-25 MG tablet Take 1 tablet by mouth daily. 09/12/23     traMADol  (ULTRAM ) 50 MG tablet Take 1 tablet (50 mg total) by mouth 2 (two) times daily as needed. 04/19/23       Allergies: Esomeprazole magnesium    Review of Systems  Musculoskeletal:  Positive for joint swelling.  Updated Vital Signs BP (!) 162/60   Pulse 87   Temp 98.8 F (37.1 C) (Oral)   Resp 16   SpO2 100%   Physical Exam Vitals and nursing note reviewed.  Constitutional:      Appearance: Normal appearance. She is not ill-appearing.  Eyes:     General: No scleral icterus. Pulmonary:     Effort: Pulmonary effort is normal. No respiratory distress.  Musculoskeletal:        General: Deformity present.     Comments: Significant right shoulder pain and tenderness to palpation.  Patient unable to move her right shoulder without significant pain.  Shoulder appears asymmetrical compared to left shoulder.  She denies  C-spine tenderness. 2+ radial pulse intact bilaterally.   Skin:    Coloration: Skin is not jaundiced.  Neurological:     General: No focal deficit present.     Mental Status: She is alert.  Psychiatric:        Mood and Affect: Mood normal.     (all labs ordered are listed, but only abnormal results are displayed) Labs Reviewed  CBC WITH DIFFERENTIAL/PLATELET  BASIC METABOLIC PANEL WITH GFR    EKG: None  Radiology: DG Shoulder Right Result Date: 06/29/2024 EXAM: 1 VIEW(S) XRAY OF THE RIGHT SHOULDER 06/29/2024 11:49:10 AM COMPARISON: Right shoulder radiographs 06/14/17 CLINICAL HISTORY: fall on right shoulder FINDINGS: BONES AND JOINTS: Glenohumeral joint is normally aligned. No acute fracture or dislocation. Subcortical cyst formation noted. Moderate glenohumeral joint osteoarthritis. Mild acromioclavicular (AC) joint osteoarthritis. SOFT TISSUES: No acute soft tissue findings. Spurring along the superior glenoid rim. Visualized lung is unremarkable. IMPRESSION: 1. No acute fracture or dislocation. 2. 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 Electronically signed by: Elsie Perone MD 06/29/2024 12:34 PM EST RP Workstation: [id number]    Procedures   Medications Ordered in the ED  fentaNYL  (SUBLIMAZE ) injection 50 mcg (50 mcg Intramuscular Given 06/29/24 1249)                                Medical Decision Making Amount and/or Complexity of Data Reviewed Labs: ordered. Radiology: ordered.  Risk Prescription drug management.   This patient presents to the ED for concern of right shoulder pain, this involves an extensive number of treatment options, and is a complaint that carries with it a high risk of complications and morbidity.   Differential diagnosis includes: anterior dislocation, posterior dislocation, proximal humerus fracture, a/c joint sprain, contusion  Co morbidities: diabetes, hypertension, history of left  total shoulder replacement   Lab Tests:  Not indicated  Imaging Studies:  I ordered imaging studies including right shoulder xray I independently visualized and interpreted imaging which showed   1. No acute fracture or dislocation.  2. Moderate glenohumeral joint osteoarthritis and mild acromioclavicular joint  osteoarthritis.   I agree with the radiologist interpretation  Cardiac Monitoring/ECG:  The patient was maintained on a cardiac monitor.  I personally viewed and interpreted the cardiac monitored which showed an underlying rhythm of: normal sinus  Medicines ordered and prescription drug management:  I ordered medication including  Medications  fentaNYL  (SUBLIMAZE ) injection 50 mcg (50 mcg Intramuscular Given 06/29/24 1249)   for pain control Reevaluation of the patient after these medicines showed that the patient improved I have reviewed the patients home medicines and have made adjustments as needed  Test Considered:  none  Critical Interventions:  none  Consultations Obtained: none  Problem List / ED Course:     ICD-10-CM   1. Acute pain of right shoulder  M25.511       MDM: 83 year old female who presents emergency department for evaluation of right shoulder pain after a fall this morning.  Patient was walking on the treadmill and attempted to get off the treadmill before he came to a complete stop, resulting in her falling.  She landed on her right shoulder, which is causing her pain at this time.  She did not hit her head and she denies LOC.  Right shoulder is exquisitely tender to palpation.  However, shoulder x-ray shows no acute dislocation or fracture.  There is evidence of moderate glenohumeral joint osteoarthritis and mild AC joint osteoarthritis.  Patient was given 50 mcg of fentanyl  for pain management, which she reported helped her pain. Recommendation for patient to be placed in a sling and follow up with ortho within 1 week. Patient is  established with Dr. Kay at [facility] so I placed an ambulatory referral there, but also advised the patient to call them if she does not hear back within 1-2 days. I educated patient on using tylenol  and ibuprofen  as needed for pain control as well as using the RICE method. Patient verbalized her understanding. Patient's vital signs are stable. At this time, patient is appropriate candidate for discharge.  Dispostion:  After consideration of the diagnostic results and the patients response to treatment, I feel that the patient would benefit from outpatient ortho follow up in 1 week.   Final diagnoses:  Acute pain of right shoulder    ED Discharge Orders          Ordered    Ambulatory referral to Orthopedic Surgery        06/29/24 1319               Torrence Marry GORMAN DEVONNA 06/29/24 1355    Dean Clarity, MD 06/29/24 [phone]

## 2024-06-29 NOTE — ED Triage Notes (Addendum)
 Pt arrives by [facility] from [facility] where she fell off treadmill.  She has right arm and shoulder pain (has chronic issues in these areas, now worse after fall) and lower back pain as well as left knee pain.  Pt ambulatory.  No LOC. CBG was 140 for ems

## 2024-06-30 DIAGNOSIS — S4981XA Other specified injuries of right shoulder and upper arm, initial encounter: Secondary | ICD-10-CM | POA: Diagnosis not present

## 2024-07-07 DIAGNOSIS — Z9181 History of falling: Secondary | ICD-10-CM | POA: Diagnosis not present

## 2024-07-07 DIAGNOSIS — M25511 Pain in right shoulder: Secondary | ICD-10-CM | POA: Diagnosis not present

## 2024-07-07 DIAGNOSIS — M25551 Pain in right hip: Secondary | ICD-10-CM | POA: Diagnosis not present

## 2024-07-25 DIAGNOSIS — M25511 Pain in right shoulder: Secondary | ICD-10-CM | POA: Diagnosis not present

## 2024-07-27 ENCOUNTER — Other Ambulatory Visit (HOSPITAL_COMMUNITY): Payer: Self-pay

## 2024-08-04 DIAGNOSIS — S46011D Strain of muscle(s) and tendon(s) of the rotator cuff of right shoulder, subsequent encounter: Secondary | ICD-10-CM | POA: Diagnosis not present

## 2024-08-04 DIAGNOSIS — Z9181 History of falling: Secondary | ICD-10-CM | POA: Diagnosis not present

## 2024-08-04 DIAGNOSIS — M25551 Pain in right hip: Secondary | ICD-10-CM | POA: Diagnosis not present

## 2024-08-26 ENCOUNTER — Other Ambulatory Visit (HOSPITAL_COMMUNITY): Payer: Self-pay

## 2024-08-26 MED ORDER — PRAVASTATIN SODIUM 20 MG PO TABS
20.0000 mg | ORAL_TABLET | Freq: Every day | ORAL | 2 refills | Status: AC
  Filled 2024-08-26: qty 30, 30d supply, fill #0

## 2024-09-16 ENCOUNTER — Other Ambulatory Visit (HOSPITAL_COMMUNITY): Payer: Self-pay

## 2024-09-16 MED ORDER — TELMISARTAN-HCTZ 80-25 MG PO TABS
1.0000 | ORAL_TABLET | Freq: Every day | ORAL | 3 refills | Status: AC
  Filled 2024-09-16: qty 90, 90d supply, fill #0
